# Patient Record
Sex: Female | Born: 1970
Health system: Southern US, Community
[De-identification: ages and names within clinical notes are randomized; demographics above are authoritative.]

## PROBLEM LIST (undated history)

## (undated) DIAGNOSIS — I890 Lymphedema, not elsewhere classified: Secondary | ICD-10-CM

## (undated) DIAGNOSIS — Z923 Personal history of irradiation: Secondary | ICD-10-CM

## (undated) DIAGNOSIS — C801 Malignant (primary) neoplasm, unspecified: Secondary | ICD-10-CM

## (undated) DIAGNOSIS — C50411 Malignant neoplasm of upper-outer quadrant of right female breast: Secondary | ICD-10-CM

## (undated) DIAGNOSIS — C50919 Malignant neoplasm of unspecified site of unspecified female breast: Secondary | ICD-10-CM

## (undated) DIAGNOSIS — Z9221 Personal history of antineoplastic chemotherapy: Secondary | ICD-10-CM

## (undated) DIAGNOSIS — K529 Noninfective gastroenteritis and colitis, unspecified: Secondary | ICD-10-CM

## (undated) HISTORY — DX: Malignant neoplasm of upper-outer quadrant of right female breast: C50.411

## (undated) HISTORY — DX: Noninfective gastroenteritis and colitis, unspecified: K52.9

## (undated) HISTORY — DX: Lymphedema, not elsewhere classified: I89.0

## (undated) HISTORY — DX: Malignant (primary) neoplasm, unspecified: C80.1

## (undated) HISTORY — PX: STRABISMUS SURGERY: SHX218

---

## 2007-10-21 ENCOUNTER — Ambulatory Visit: Payer: Self-pay | Admitting: Internal Medicine

## 2008-10-27 ENCOUNTER — Ambulatory Visit: Payer: Self-pay | Admitting: Internal Medicine

## 2009-10-30 ENCOUNTER — Ambulatory Visit: Payer: Self-pay | Admitting: Internal Medicine

## 2011-04-23 ENCOUNTER — Ambulatory Visit: Payer: Self-pay | Admitting: Internal Medicine

## 2011-04-25 ENCOUNTER — Ambulatory Visit: Payer: Self-pay | Admitting: Internal Medicine

## 2011-11-28 DIAGNOSIS — Z6829 Body mass index (BMI) 29.0-29.9, adult: Secondary | ICD-10-CM | POA: Insufficient documentation

## 2012-04-07 DIAGNOSIS — C801 Malignant (primary) neoplasm, unspecified: Secondary | ICD-10-CM

## 2012-04-07 DIAGNOSIS — C50919 Malignant neoplasm of unspecified site of unspecified female breast: Secondary | ICD-10-CM

## 2012-04-07 HISTORY — PX: BREAST LUMPECTOMY: SHX2

## 2012-04-07 HISTORY — DX: Malignant neoplasm of unspecified site of unspecified female breast: C50.919

## 2012-04-07 HISTORY — DX: Malignant (primary) neoplasm, unspecified: C80.1

## 2012-04-07 HISTORY — PX: BREAST BIOPSY: SHX20

## 2012-05-18 ENCOUNTER — Ambulatory Visit: Payer: Self-pay | Admitting: Internal Medicine

## 2012-06-05 DIAGNOSIS — C50411 Malignant neoplasm of upper-outer quadrant of right female breast: Secondary | ICD-10-CM

## 2012-06-05 DIAGNOSIS — I10 Essential (primary) hypertension: Secondary | ICD-10-CM | POA: Insufficient documentation

## 2012-06-05 HISTORY — DX: Malignant neoplasm of upper-outer quadrant of right female breast: C50.411

## 2012-06-16 ENCOUNTER — Ambulatory Visit: Payer: Self-pay | Admitting: General Surgery

## 2012-06-21 ENCOUNTER — Ambulatory Visit: Payer: Self-pay | Admitting: General Surgery

## 2012-06-21 DIAGNOSIS — C50319 Malignant neoplasm of lower-inner quadrant of unspecified female breast: Secondary | ICD-10-CM

## 2012-06-21 HISTORY — PX: BREAST SURGERY: SHX581

## 2012-06-23 LAB — PATHOLOGY REPORT

## 2012-06-24 ENCOUNTER — Ambulatory Visit (INDEPENDENT_AMBULATORY_CARE_PROVIDER_SITE_OTHER): Payer: No Typology Code available for payment source | Admitting: General Surgery

## 2012-06-24 DIAGNOSIS — C50919 Malignant neoplasm of unspecified site of unspecified female breast: Secondary | ICD-10-CM

## 2012-06-24 NOTE — Patient Instructions (Addendum)
Continue range of motion exercises. Continue to record JP drain output. May shower.

## 2012-06-24 NOTE — Progress Notes (Signed)
Subjective:     Patient ID: Natalie Gallagher, female   DOB: 1970/08/01, 42 y.o.   MRN: [id number]  HPI Patient here today for wound check and JP drain evaluation.  States she is doing well and making progress.   Review of Systems  Constitutional: Negative.   Respiratory: Negative.   Cardiovascular: Negative.        Objective:   Physical Exam    Wounds healing well. No loculated fluid at wide excision site. JP exit site clean.  Assessment:     Breast cancer.     Plan:     Path reviewed. Nodes negative.  Margins clear.  ER/PR positive, Her 2 neu not over expressed. Histologic grade 3, 1.7 cm ( T1c,N0) BRCA negative. Candidate to meet with radiation oncology and medical oncology. She may benefit from Oncotype DX analysis.

## 2012-06-27 DIAGNOSIS — C50919 Malignant neoplasm of unspecified site of unspecified female breast: Secondary | ICD-10-CM | POA: Insufficient documentation

## 2012-06-29 ENCOUNTER — Ambulatory Visit (INDEPENDENT_AMBULATORY_CARE_PROVIDER_SITE_OTHER): Payer: No Typology Code available for payment source | Admitting: *Deleted

## 2012-06-29 DIAGNOSIS — C50911 Malignant neoplasm of unspecified site of right female breast: Secondary | ICD-10-CM

## 2012-06-29 DIAGNOSIS — C50919 Malignant neoplasm of unspecified site of unspecified female breast: Secondary | ICD-10-CM

## 2012-06-29 NOTE — Progress Notes (Signed)
Patient came in today and JP drain is still draining >69ml daily. Recommend to keep drain in for now and pt to call me Thursday with status update.  Pt getting along well. Incision clean.

## 2012-07-01 ENCOUNTER — Ambulatory Visit (INDEPENDENT_AMBULATORY_CARE_PROVIDER_SITE_OTHER): Payer: No Typology Code available for payment source | Admitting: *Deleted

## 2012-07-01 DIAGNOSIS — C50919 Malignant neoplasm of unspecified site of unspecified female breast: Secondary | ICD-10-CM

## 2012-07-01 NOTE — Progress Notes (Signed)
Patient here today for JP drain check.  Drain output <30 ml day/3 days, removed.  Sealed dressing applied. F/U as scheduled

## 2012-07-08 ENCOUNTER — Ambulatory Visit (INDEPENDENT_AMBULATORY_CARE_PROVIDER_SITE_OTHER): Payer: No Typology Code available for payment source | Admitting: General Surgery

## 2012-07-08 ENCOUNTER — Encounter: Payer: Self-pay | Admitting: General Surgery

## 2012-07-08 VITALS — BP 138/82 | HR 80 | Resp 12 | Ht 61.0 in | Wt 166.0 lb

## 2012-07-08 DIAGNOSIS — C50911 Malignant neoplasm of unspecified site of right female breast: Secondary | ICD-10-CM

## 2012-07-08 DIAGNOSIS — C50919 Malignant neoplasm of unspecified site of unspecified female breast: Secondary | ICD-10-CM

## 2012-07-08 NOTE — Progress Notes (Signed)
Patient ID: Natalie Gallagher, female   DOB: 16-Dec-1970, 42 y.o.   MRN: [id number]  Chief Complaint  Patient presents with  . Other    postop    HPI Natalie Gallagher is a 42 y.o. female.  Patient here today for follow up from right breast surgery.  Doing well. No complaints. States she has not had an appointment with the Cancer Center as of yet. HPI  Past Medical History  Diagnosis Date  . Hypertension   . Cancer 2014    right breast.right wide excision with sentinal node biopsy on 06/21/12    Past Surgical History  Procedure Laterality Date  . Breast surgery Right 06/21/2012    right wide excision with sentinal node biopsy on 06/21/12    No family history on file.  Social History History  Substance Use Topics  . Smoking status: Never Smoker   . Smokeless tobacco: Not on file  . Alcohol Use: No    No Known Allergies  Current Outpatient Prescriptions  Medication Sig Dispense Refill  . triamterene-hydrochlorothiazide (MAXZIDE-25) 37.5-25 MG per tablet Take 1 tablet by mouth daily.        No current facility-administered medications for this visit.    Review of Systems Review of Systems  Constitutional: Negative.   Respiratory: Negative.   Cardiovascular: Negative.     Blood pressure 138/82, pulse 80, resp. rate 12, height 5\' 1"  (1.549 m), weight 166 lb (75.297 kg), last menstrual period 06/30/2012.  Physical Exam Physical Exam  Constitutional: She is oriented to person, place, and time. She appears well-developed and well-nourished.  Neurological: She is alert and oriented to person, place, and time.  Skin: Skin is warm and dry.  Incision well healed. Range of motion much improved.  Data Reviewed Pathology showed a 1.7 cm histologic grade 3 invasive ductal carcinoma 17 mm in diameter. Sentinel node could not be identified during the procedure and a formal axillary dissection was completed. 13 nodes were removed, no evidence of metastatic disease.  ER 60%, PR 70%,  HER-2/neu not overexpressing.  Previous BRCA testing was negative for dictation.  Assessment    Right breast cancer.    Plan    Arrangements have been made for the patient to be evaluated by medical oncology and radiation oncology on Tuesday, 07/13/2012.       [name] 07/09/2012, 4:18 PM

## 2012-07-08 NOTE — Patient Instructions (Addendum)
Appointment with Cancer Center scheduled for 07/13/2012.

## 2012-07-09 ENCOUNTER — Encounter: Payer: Self-pay | Admitting: General Surgery

## 2012-07-13 ENCOUNTER — Ambulatory Visit: Payer: Self-pay | Admitting: Hematology and Oncology

## 2012-07-14 ENCOUNTER — Ambulatory Visit: Payer: No Typology Code available for payment source | Admitting: General Surgery

## 2012-07-26 ENCOUNTER — Ambulatory Visit: Payer: Self-pay | Admitting: Emergency Medicine

## 2012-07-29 ENCOUNTER — Other Ambulatory Visit: Payer: Self-pay | Admitting: General Surgery

## 2012-07-29 ENCOUNTER — Telehealth: Payer: Self-pay | Admitting: *Deleted

## 2012-07-29 ENCOUNTER — Telehealth: Payer: Self-pay | Admitting: General Surgery

## 2012-07-29 ENCOUNTER — Ambulatory Visit: Payer: Self-pay

## 2012-07-29 DIAGNOSIS — C50911 Malignant neoplasm of unspecified site of right female breast: Secondary | ICD-10-CM

## 2012-07-29 NOTE — Telephone Encounter (Signed)
Per Steward Drone at Dr. Loistine Chance office, patient needs port placement. Chemotherapy to start on 08-10-12. Patient's surgery has been scheduled for 07-30-12 at [facility]. She is aware of date, time, and instructions.

## 2012-07-29 NOTE — Telephone Encounter (Signed)
The patient has met with medical oncology and is felt to be a candidate for adjuvant chemotherapy. A power port as been requested. Scheduling has necessitated that this be completed tomorrow.  I contacted the patient to review the procedure. Pros and cons of power port placement including risks associated with venous and pulmonary injury were reviewed.  We'll proceed with planned power port placement tomorrow as an outpatient.

## 2012-07-30 ENCOUNTER — Ambulatory Visit: Payer: Self-pay | Admitting: General Surgery

## 2012-07-30 DIAGNOSIS — C50919 Malignant neoplasm of unspecified site of unspecified female breast: Secondary | ICD-10-CM

## 2012-08-05 ENCOUNTER — Ambulatory Visit (INDEPENDENT_AMBULATORY_CARE_PROVIDER_SITE_OTHER): Payer: No Typology Code available for payment source | Admitting: *Deleted

## 2012-08-05 ENCOUNTER — Ambulatory Visit: Payer: Self-pay | Admitting: Hematology and Oncology

## 2012-08-05 DIAGNOSIS — C50919 Malignant neoplasm of unspecified site of unspecified female breast: Secondary | ICD-10-CM

## 2012-08-05 NOTE — Progress Notes (Signed)
Patient here today for follow up post port a cath.  Dressing removed, steristrip in place and aware it may come off in one week.  Some skin irritation/blistering noted from Tegaderm but more definitve at Telfa area.  The patient is aware that a heating pad may be used for comfort as needed.  States she is finishing up ATB for infection in her finger left hand (hang nail). Follow up as scheduled Aug 17 2012 unless determined different by MD.

## 2012-08-05 NOTE — Patient Instructions (Addendum)
The patient is aware that a heating pad may be used for comfort as needed. Aware to use coco butter or neosporin to irritated areas as needed. To start chemo treatments Tuesday. Follow up as scheduled.

## 2012-08-10 LAB — CBC CANCER CENTER
Basophil %: 1.2 %
Eosinophil #: 0.2 x10 3/mm (ref 0.0–0.7)
Lymphocyte #: 2.3 x10 3/mm (ref 1.0–3.6)
Lymphocyte %: 23.9 %
MCHC: 33.3 g/dL (ref 32.0–36.0)
MCV: 86 fL (ref 80–100)
Monocyte #: 0.7 x10 3/mm (ref 0.2–0.9)
Neutrophil #: 6.4 x10 3/mm (ref 1.4–6.5)
Platelet: 287 x10 3/mm (ref 150–440)
WBC: 9.8 x10 3/mm (ref 3.6–11.0)

## 2012-08-10 LAB — COMPREHENSIVE METABOLIC PANEL
Alkaline Phosphatase: 81 U/L (ref 50–136)
Anion Gap: 10 (ref 7–16)
Co2: 29 mmol/L (ref 21–32)
EGFR (African American): >60
EGFR (Non-African Amer.): >60
Potassium: 3.4 mmol/L — ABNORMAL LOW (ref 3.5–5.1)
Sodium: 141 mmol/L (ref 136–145)
Total Protein: 7.9 g/dL (ref 6.4–8.2)

## 2012-08-17 ENCOUNTER — Other Ambulatory Visit: Payer: Self-pay | Admitting: *Deleted

## 2012-08-17 ENCOUNTER — Ambulatory Visit: Payer: No Typology Code available for payment source | Admitting: General Surgery

## 2012-08-17 ENCOUNTER — Encounter: Payer: Self-pay | Admitting: General Surgery

## 2012-08-17 ENCOUNTER — Ambulatory Visit (INDEPENDENT_AMBULATORY_CARE_PROVIDER_SITE_OTHER): Payer: No Typology Code available for payment source | Admitting: General Surgery

## 2012-08-17 VITALS — BP 110/64 | HR 74 | Resp 14 | Ht 61.0 in | Wt 171.0 lb

## 2012-08-17 DIAGNOSIS — C50919 Malignant neoplasm of unspecified site of unspecified female breast: Secondary | ICD-10-CM

## 2012-08-17 LAB — BASIC METABOLIC PANEL
Anion Gap: 10 (ref 7–16)
BUN: 15 mg/dL (ref 7–18)
Creatinine: 0.92 mg/dL (ref 0.60–1.30)
EGFR (Non-African Amer.): >60
Glucose: 96 mg/dL (ref 65–99)
Osmolality: 278 (ref 275–301)
Sodium: 139 mmol/L (ref 136–145)

## 2012-08-17 LAB — CBC CANCER CENTER
Basophil #: 0 x10 3/mm (ref 0.0–0.1)
Eosinophil #: 0.1 x10 3/mm (ref 0.0–0.7)
Eosinophil %: 1.3 %
HCT: 35.1 % (ref 35.0–47.0)
HGB: 11.8 g/dL — ABNORMAL LOW (ref 12.0–16.0)
Lymphocyte #: 2.5 x10 3/mm (ref 1.0–3.6)
Lymphocyte %: 28.2 %
MCV: 85 fL (ref 80–100)
Monocyte %: 7.2 %
Neutrophil #: 5.6 x10 3/mm (ref 1.4–6.5)
Platelet: 251 x10 3/mm (ref 150–440)

## 2012-08-17 NOTE — Patient Instructions (Signed)
Return in 4 months after mammogram.

## 2012-08-17 NOTE — Progress Notes (Signed)
   Patient ID: Natalie Gallagher, female   DOB: [birth year], 42 y.o.   MRN: [id number]  Chief Complaint  Patient presents with  . Other    breast ca    HPI Natalie Gallagher is a 42 y.o. female here today for her follow up right wide excision with sentinal node biopsy on 3/17/1 Patient states is doing well. No new breast problem.Just started chemo 08/17/12. HPI  Past Medical History  Diagnosis Date  . Hypertension   . Cancer 2014    right breast.right wide excision with sentinal node biopsy on 06/21/12    Past Surgical History  Procedure Laterality Date  . Breast surgery Right 06/21/2012    right wide excision with sentinal node biopsy on 06/21/12    No family history on file.  Social History History  Substance Use Topics  . Smoking status: Never Smoker   . Smokeless tobacco: Not on file  . Alcohol Use: No    Allergies  Allergen Reactions  . Tape     The patient showed a cutaneous reaction to Telfa applied beneath a Tegaderm dressing. Telfa appears to be the primary offensive material.    Current Outpatient Prescriptions  Medication Sig Dispense Refill  . triamterene-hydrochlorothiazide (MAXZIDE-25) 37.5-25 MG per tablet Take 1 tablet by mouth daily.        No current facility-administered medications for this visit.    Review of Systems Review of Systems  Constitutional: Negative.   Respiratory: Negative.   Cardiovascular: Negative.     Blood pressure 110/64, pulse 74, resp. rate 14, height 5\' 1"  (1.549 m), weight 171 lb (77.565 kg), last menstrual period 07/27/2012.  Physical Exam Physical Exam  Pulmonary/Chest: Right breast exhibits no inverted nipple, no mass, no nipple discharge, no skin change and no tenderness. Left breast exhibits no inverted nipple, no mass, no nipple discharge, no skin change and no tenderness.   the right breast wide excision site is healing well. No focal tenderness or induration.  The power port site is clean. She reports no difficulty with  access at the time of her chemotherapy earlier today.  She was concerned about some mild swelling in the right upper arm. Serial measurements were obtained at a location 20 cm and 10 cm above the olecranon process as well as tendon 20 cm below the olecranon process.  On the right side these measurements yielded: 39.5, 32.5, 25 and 19 cm respectively.  On the left side these measurements yielded 38.5, 33, 25 and 19 cm respectively.   Data Reviewed None  Assessment    Doing well status post wide excision of her right breast tumor in status post left subclavian power port placement.  Minimal upper right arm swelling, questionably related to lymphedema this time.      Plan    With a 1 cm difference in the upper extremity measurements in one location, I think at this time observation alone is warranted.  The patient will continue her present planned adjuvant chemotherapy based on high intermediate risk Oncotype testing under the care of Dr. Wendie Simmer.       [name] 08/18/2012, 7:08 AM

## 2012-08-17 NOTE — Progress Notes (Signed)
The patient has been asked to return to the office in four months for a unilateral right breast diagnostic mammogram.  

## 2012-08-18 ENCOUNTER — Encounter: Payer: Self-pay | Admitting: General Surgery

## 2012-08-26 LAB — CBC CANCER CENTER
Basophil %: 0.5 %
Eosinophil #: 0 x10 3/mm (ref 0.0–0.7)
Eosinophil %: 0.7 %
HGB: 12.2 g/dL (ref 12.0–16.0)
Lymphocyte #: 1.8 x10 3/mm (ref 1.0–3.6)
MCH: 28.9 pg (ref 26.0–34.0)
MCHC: 34 g/dL (ref 32.0–36.0)
Monocyte #: 0.3 x10 3/mm (ref 0.2–0.9)
Neutrophil #: 5 x10 3/mm (ref 1.4–6.5)
Neutrophil %: 68.9 %
Platelet: 201 x10 3/mm (ref 150–440)
RBC: 4.23 10*6/uL (ref 3.80–5.20)
RDW: 13.4 % (ref 11.5–14.5)

## 2012-09-05 ENCOUNTER — Ambulatory Visit: Payer: Self-pay | Admitting: Hematology and Oncology

## 2012-09-07 LAB — CBC CANCER CENTER
Basophil #: 0 x10 3/mm (ref 0.0–0.1)
Basophil %: 0.1 %
Eosinophil #: 0 x10 3/mm (ref 0.0–0.7)
Eosinophil %: 0.1 %
HCT: 35.5 % (ref 35.0–47.0)
HGB: 12.1 g/dL (ref 12.0–16.0)
Lymphocyte %: 20.8 %
MCH: 29.5 pg (ref 26.0–34.0)
MCV: 86 fL (ref 80–100)
Neutrophil %: 69.6 %
RDW: 14.2 % (ref 11.5–14.5)
WBC: 9.8 x10 3/mm (ref 3.6–11.0)

## 2012-09-07 LAB — BASIC METABOLIC PANEL
Anion Gap: 10 (ref 7–16)
BUN: 19 mg/dL — ABNORMAL HIGH (ref 7–18)
Chloride: 102 mmol/L (ref 98–107)
EGFR (Non-African Amer.): >60
Osmolality: 281 (ref 275–301)

## 2012-09-15 LAB — CBC CANCER CENTER
Basophil #: 0 x10 3/mm (ref 0.0–0.1)
Basophil %: 1 %
HGB: 11.5 g/dL — ABNORMAL LOW (ref 12.0–16.0)
Lymphocyte #: 0.7 x10 3/mm — ABNORMAL LOW (ref 1.0–3.6)
MCV: 86 fL (ref 80–100)
Monocyte #: 0.2 x10 3/mm (ref 0.2–0.9)
Neutrophil #: 0.9 x10 3/mm — ABNORMAL LOW (ref 1.4–6.5)

## 2012-09-15 LAB — BASIC METABOLIC PANEL
Anion Gap: 6 — ABNORMAL LOW (ref 7–16)
BUN: 15 mg/dL (ref 7–18)
Calcium, Total: 9 mg/dL (ref 8.5–10.1)
EGFR (African American): >60
Osmolality: 276 (ref 275–301)
Sodium: 138 mmol/L (ref 136–145)

## 2012-09-28 LAB — CBC CANCER CENTER
Basophil %: 0.6 %
Eosinophil #: 0 x10 3/mm (ref 0.0–0.7)
Lymphocyte #: 1.3 x10 3/mm (ref 1.0–3.6)
Lymphocyte %: 22.7 %
MCH: 30.6 pg (ref 26.0–34.0)
Monocyte #: 0.8 x10 3/mm (ref 0.2–0.9)
Monocyte %: 13.1 %
Neutrophil #: 3.7 x10 3/mm (ref 1.4–6.5)
Neutrophil %: 63.1 %
RBC: 3.71 10*6/uL — ABNORMAL LOW (ref 3.80–5.20)
WBC: 5.9 x10 3/mm (ref 3.6–11.0)

## 2012-09-28 LAB — BASIC METABOLIC PANEL
Anion Gap: 8 (ref 7–16)
BUN: 18 mg/dL (ref 7–18)
Calcium, Total: 8.9 mg/dL (ref 8.5–10.1)
Chloride: 102 mmol/L (ref 98–107)
Co2: 30 mmol/L (ref 21–32)
Creatinine: 1.04 mg/dL (ref 0.60–1.30)
EGFR (African American): >60
EGFR (Non-African Amer.): >60
Glucose: 91 mg/dL (ref 65–99)
Osmolality: 281 (ref 275–301)
Potassium: 3.1 mmol/L — ABNORMAL LOW (ref 3.5–5.1)
Sodium: 140 mmol/L (ref 136–145)

## 2012-10-05 ENCOUNTER — Ambulatory Visit: Payer: Self-pay | Admitting: Hematology and Oncology

## 2012-10-14 LAB — CBC CANCER CENTER
Basophil #: 0.1 x10 3/mm (ref 0.0–0.1)
Basophil %: 0.5 %
Eosinophil #: 0 x10 3/mm (ref 0.0–0.7)
HCT: 28.9 % — ABNORMAL LOW (ref 35.0–47.0)
HGB: 9.8 g/dL — ABNORMAL LOW (ref 12.0–16.0)
Lymphocyte #: 1.3 x10 3/mm (ref 1.0–3.6)
MCHC: 34 g/dL (ref 32.0–36.0)
MCV: 88 fL (ref 80–100)
Monocyte #: 1.3 x10 3/mm — ABNORMAL HIGH (ref 0.2–0.9)
Neutrophil #: 9.1 x10 3/mm — ABNORMAL HIGH (ref 1.4–6.5)
Neutrophil %: 76.9 %
Platelet: 310 x10 3/mm (ref 150–440)
RBC: 3.27 10*6/uL — ABNORMAL LOW (ref 3.80–5.20)

## 2012-10-19 LAB — CBC CANCER CENTER
Eosinophil #: 0 x10 3/mm (ref 0.0–0.7)
HGB: 10.5 g/dL — ABNORMAL LOW (ref 12.0–16.0)
MCH: 30.7 pg (ref 26.0–34.0)
MCHC: 34.5 g/dL (ref 32.0–36.0)
Monocyte #: 0.7 x10 3/mm (ref 0.2–0.9)
Neutrophil #: 4 x10 3/mm (ref 1.4–6.5)
Neutrophil %: 68.4 %
Platelet: 331 x10 3/mm (ref 150–440)
RBC: 3.43 10*6/uL — ABNORMAL LOW (ref 3.80–5.20)
RDW: 17.1 % — ABNORMAL HIGH (ref 11.5–14.5)

## 2012-10-19 LAB — BASIC METABOLIC PANEL
Creatinine: 1.06 mg/dL (ref 0.60–1.30)
EGFR (Non-African Amer.): >60
Glucose: 99 mg/dL (ref 65–99)
Osmolality: 283 (ref 275–301)
Potassium: 3.7 mmol/L (ref 3.5–5.1)
Sodium: 142 mmol/L (ref 136–145)

## 2012-10-26 ENCOUNTER — Encounter: Payer: Self-pay | Admitting: Internal Medicine

## 2012-11-05 ENCOUNTER — Ambulatory Visit: Payer: Self-pay | Admitting: Hematology and Oncology

## 2012-11-05 ENCOUNTER — Encounter: Payer: Self-pay | Admitting: Internal Medicine

## 2012-11-09 LAB — CBC CANCER CENTER
Basophil %: 0.3 %
Eosinophil #: 0 x10 3/mm (ref 0.0–0.7)
Eosinophil %: 0.4 %
HCT: 27.2 % — ABNORMAL LOW (ref 35.0–47.0)
HGB: 9.5 g/dL — ABNORMAL LOW (ref 12.0–16.0)
Lymphocyte #: 0.9 x10 3/mm — ABNORMAL LOW (ref 1.0–3.6)
Lymphocyte %: 16.8 %
MCHC: 35 g/dL (ref 32.0–36.0)
Monocyte #: 0.9 x10 3/mm (ref 0.2–0.9)
Monocyte %: 18.1 %
Neutrophil #: 3.4 x10 3/mm (ref 1.4–6.5)
Neutrophil %: 64.4 %
Platelet: 276 x10 3/mm (ref 150–440)
RDW: 17.9 % — ABNORMAL HIGH (ref 11.5–14.5)
WBC: 5.2 x10 3/mm (ref 3.6–11.0)

## 2012-11-09 LAB — COMPREHENSIVE METABOLIC PANEL
Albumin: 3.5 g/dL (ref 3.4–5.0)
Anion Gap: 8 (ref 7–16)
Bilirubin,Total: 0.2 mg/dL (ref 0.2–1.0)
Chloride: 105 mmol/L (ref 98–107)
EGFR (Non-African Amer.): >60
Glucose: 97 mg/dL (ref 65–99)
Potassium: 3.5 mmol/L (ref 3.5–5.1)
SGOT(AST): 22 U/L (ref 15–37)
Total Protein: 7 g/dL (ref 6.4–8.2)

## 2012-11-16 LAB — CBC CANCER CENTER
Basophil #: 0 x10 3/mm (ref 0.0–0.1)
Basophil %: 0.5 %
Eosinophil %: 1.3 %
HCT: 27.2 % — ABNORMAL LOW (ref 35.0–47.0)
HGB: 9.5 g/dL — ABNORMAL LOW (ref 12.0–16.0)
Lymphocyte #: 1 x10 3/mm (ref 1.0–3.6)
Lymphocyte %: 17.7 %
MCH: 31.7 pg (ref 26.0–34.0)
MCV: 91 fL (ref 80–100)
Monocyte %: 10 %
Neutrophil #: 3.9 x10 3/mm (ref 1.4–6.5)
Platelet: 273 x10 3/mm (ref 150–440)
RBC: 3 10*6/uL — ABNORMAL LOW (ref 3.80–5.20)
RDW: 16.8 % — ABNORMAL HIGH (ref 11.5–14.5)
WBC: 5.6 x10 3/mm (ref 3.6–11.0)

## 2012-11-16 LAB — COMPREHENSIVE METABOLIC PANEL
Albumin: 3.4 g/dL (ref 3.4–5.0)
BUN: 13 mg/dL (ref 7–18)
Bilirubin,Total: 0.3 mg/dL (ref 0.2–1.0)
Creatinine: 1.05 mg/dL (ref 0.60–1.30)
EGFR (African American): >60
EGFR (Non-African Amer.): >60
Glucose: 83 mg/dL (ref 65–99)
Osmolality: 282 (ref 275–301)
Potassium: 3.7 mmol/L (ref 3.5–5.1)
SGOT(AST): 37 U/L (ref 15–37)
SGPT (ALT): 85 U/L — ABNORMAL HIGH (ref 12–78)
Sodium: 142 mmol/L (ref 136–145)
Total Protein: 7.1 g/dL (ref 6.4–8.2)

## 2012-11-23 LAB — CBC CANCER CENTER
Basophil #: 0 x10 3/mm (ref 0.0–0.1)
Basophil %: 0.5 %
Eosinophil #: 0.1 x10 3/mm (ref 0.0–0.7)
Eosinophil %: 2 %
HCT: 25.3 % — ABNORMAL LOW (ref 35.0–47.0)
Lymphocyte #: 0.8 x10 3/mm — ABNORMAL LOW (ref 1.0–3.6)
Lymphocyte %: 18.3 %
MCH: 32.5 pg (ref 26.0–34.0)
Monocyte #: 0.3 x10 3/mm (ref 0.2–0.9)
Monocyte %: 7.5 %
RDW: 17.2 % — ABNORMAL HIGH (ref 11.5–14.5)

## 2012-11-23 LAB — COMPREHENSIVE METABOLIC PANEL
Alkaline Phosphatase: 74 U/L (ref 50–136)
Anion Gap: 6 — ABNORMAL LOW (ref 7–16)
BUN: 10 mg/dL (ref 7–18)
Bilirubin,Total: 0.2 mg/dL (ref 0.2–1.0)
Chloride: 104 mmol/L (ref 98–107)
Co2: 29 mmol/L (ref 21–32)
Creatinine: 0.94 mg/dL (ref 0.60–1.30)
EGFR (Non-African Amer.): >60
Glucose: 89 mg/dL (ref 65–99)
Potassium: 3.4 mmol/L — ABNORMAL LOW (ref 3.5–5.1)
SGOT(AST): 29 U/L (ref 15–37)
SGPT (ALT): 67 U/L (ref 12–78)
Total Protein: 6.9 g/dL (ref 6.4–8.2)

## 2012-11-25 ENCOUNTER — Telehealth: Payer: Self-pay | Admitting: *Deleted

## 2012-11-25 NOTE — Telephone Encounter (Signed)
Notified patient as instructed. She was just concerned because they had told her that she did need one.

## 2012-11-25 NOTE — Telephone Encounter (Signed)
Message copied by Currie Paris on Thu Nov 25, 2012  8:32 AM ------      Message from: [place], [place] W      Created: Wed Nov 24, 2012  9:12 PM      Regarding: FW: Patient       Contact: [phone]       Please notify the patient that she does not need a compression sleeve for airline travel.      ----- Message -----         From: Jena Gauss, CMA         Sent: 11/24/2012   9:45 AM           To: [name], MD      Subject: Patient                                                  Pt called and wanted to know if she can get a prescription for a compression sleeve with gauntlet. She is flying out of town and was wondering when she might could receive this.        ------

## 2012-11-29 ENCOUNTER — Ambulatory Visit: Payer: Self-pay | Admitting: Otolaryngology

## 2012-11-29 LAB — HCG, QUANTITATIVE, PREGNANCY: Beta Hcg, Quant.: <1 m[IU]/mL — ABNORMAL LOW

## 2012-11-30 ENCOUNTER — Ambulatory Visit: Payer: Self-pay | Admitting: Otolaryngology

## 2012-11-30 LAB — COMPREHENSIVE METABOLIC PANEL
Albumin: 3.2 g/dL — ABNORMAL LOW (ref 3.4–5.0)
Alkaline Phosphatase: 78 U/L (ref 50–136)
BUN: 10 mg/dL (ref 7–18)
Bilirubin,Total: 0.2 mg/dL (ref 0.2–1.0)
Calcium, Total: 9 mg/dL (ref 8.5–10.1)
Chloride: 105 mmol/L (ref 98–107)
Co2: 30 mmol/L (ref 21–32)
Glucose: 92 mg/dL (ref 65–99)
Potassium: 3.4 mmol/L — ABNORMAL LOW (ref 3.5–5.1)

## 2012-11-30 LAB — CBC CANCER CENTER
Lymphocyte %: 20.3 %
MCHC: 35.2 g/dL (ref 32.0–36.0)
MCV: 92 fL (ref 80–100)
Monocyte #: 0.4 x10 3/mm (ref 0.2–0.9)
Neutrophil #: 3 x10 3/mm (ref 1.4–6.5)
WBC: 4.3 x10 3/mm (ref 3.6–11.0)

## 2012-12-06 ENCOUNTER — Ambulatory Visit: Payer: Self-pay | Admitting: Hematology and Oncology

## 2012-12-07 LAB — CBC CANCER CENTER
Basophil %: 0.5 %
Eosinophil %: 1.8 %
Lymphocyte #: 0.9 x10 3/mm — ABNORMAL LOW (ref 1.0–3.6)
Lymphocyte %: 19.8 %
Monocyte #: 0.4 x10 3/mm (ref 0.2–0.9)
Neutrophil %: 68.9 %
Platelet: 264 x10 3/mm (ref 150–440)
RBC: 2.8 10*6/uL — ABNORMAL LOW (ref 3.80–5.20)
RDW: 16.7 % — ABNORMAL HIGH (ref 11.5–14.5)

## 2012-12-07 LAB — BASIC METABOLIC PANEL
BUN: 12 mg/dL (ref 7–18)
Calcium, Total: 8.9 mg/dL (ref 8.5–10.1)
Co2: 27 mmol/L (ref 21–32)
Osmolality: 283 (ref 275–301)
Potassium: 3.5 mmol/L (ref 3.5–5.1)

## 2012-12-14 LAB — BASIC METABOLIC PANEL
Anion Gap: 7 (ref 7–16)
Chloride: 104 mmol/L (ref 98–107)
Creatinine: 0.97 mg/dL (ref 0.60–1.30)
Osmolality: 281 (ref 275–301)
Potassium: 3.9 mmol/L (ref 3.5–5.1)
Sodium: 142 mmol/L (ref 136–145)

## 2012-12-14 LAB — CBC CANCER CENTER
Basophil #: 0 x10 3/mm (ref 0.0–0.1)
Basophil %: 0.5 %
Eosinophil #: 0.1 x10 3/mm (ref 0.0–0.7)
Eosinophil %: 1.3 %
HCT: 28.3 % — ABNORMAL LOW (ref 35.0–47.0)
Lymphocyte #: 1 x10 3/mm (ref 1.0–3.6)
Lymphocyte %: 21.3 %
MCH: 32 pg (ref 26.0–34.0)
MCV: 92 fL (ref 80–100)
Monocyte #: 0.4 x10 3/mm (ref 0.2–0.9)
Monocyte %: 8.3 %
Neutrophil #: 3.1 x10 3/mm (ref 1.4–6.5)
Neutrophil %: 68.6 %
Platelet: 298 x10 3/mm (ref 150–440)
RBC: 3.06 10*6/uL — ABNORMAL LOW (ref 3.80–5.20)
RDW: 16.7 % — ABNORMAL HIGH (ref 11.5–14.5)

## 2012-12-21 LAB — BASIC METABOLIC PANEL
Anion Gap: 10 (ref 7–16)
BUN: 12 mg/dL (ref 7–18)
Calcium, Total: 9.3 mg/dL (ref 8.5–10.1)
Chloride: 105 mmol/L (ref 98–107)
Creatinine: 0.87 mg/dL (ref 0.60–1.30)
EGFR (African American): >60
Glucose: 94 mg/dL (ref 65–99)
Potassium: 3.4 mmol/L — ABNORMAL LOW (ref 3.5–5.1)
Sodium: 143 mmol/L (ref 136–145)

## 2012-12-21 LAB — CBC CANCER CENTER
Basophil #: 0 x10 3/mm (ref 0.0–0.1)
Eosinophil %: 1 %
Lymphocyte #: 1 x10 3/mm (ref 1.0–3.6)
Lymphocyte %: 21.1 %
MCV: 92 fL (ref 80–100)
Neutrophil %: 68.2 %
Platelet: 288 x10 3/mm (ref 150–440)
RBC: 3.02 10*6/uL — ABNORMAL LOW (ref 3.80–5.20)
WBC: 4.7 x10 3/mm (ref 3.6–11.0)

## 2012-12-23 ENCOUNTER — Ambulatory Visit: Payer: Self-pay | Admitting: General Surgery

## 2012-12-27 ENCOUNTER — Encounter: Payer: Self-pay | Admitting: General Surgery

## 2012-12-28 LAB — CBC CANCER CENTER
Basophil %: 0.4 %
Eosinophil #: 0.1 x10 3/mm (ref 0.0–0.7)
HCT: 30 % — ABNORMAL LOW (ref 35.0–47.0)
HGB: 10.1 g/dL — ABNORMAL LOW (ref 12.0–16.0)
Lymphocyte #: 1 x10 3/mm (ref 1.0–3.6)
MCH: 31.1 pg (ref 26.0–34.0)
MCHC: 33.6 g/dL (ref 32.0–36.0)
Monocyte #: 0.4 x10 3/mm (ref 0.2–0.9)
Monocyte %: 10 %
RDW: 15.7 % — ABNORMAL HIGH (ref 11.5–14.5)
WBC: 4.5 x10 3/mm (ref 3.6–11.0)

## 2012-12-28 LAB — BASIC METABOLIC PANEL
Anion Gap: 8 (ref 7–16)
BUN: 8 mg/dL (ref 7–18)
Creatinine: 0.85 mg/dL (ref 0.60–1.30)
EGFR (African American): >60
Glucose: 84 mg/dL (ref 65–99)
Osmolality: 283 (ref 275–301)
Potassium: 3.5 mmol/L (ref 3.5–5.1)
Sodium: 143 mmol/L (ref 136–145)

## 2013-01-04 ENCOUNTER — Ambulatory Visit: Payer: No Typology Code available for payment source | Admitting: General Surgery

## 2013-01-04 LAB — BASIC METABOLIC PANEL
BUN: 10 mg/dL (ref 7–18)
Calcium, Total: 8.6 mg/dL (ref 8.5–10.1)
Chloride: 105 mmol/L (ref 98–107)
Creatinine: 0.82 mg/dL (ref 0.60–1.30)
EGFR (African American): >60
Glucose: 84 mg/dL (ref 65–99)
Osmolality: 283 (ref 275–301)
Potassium: 3.5 mmol/L (ref 3.5–5.1)
Sodium: 143 mmol/L (ref 136–145)

## 2013-01-04 LAB — CBC CANCER CENTER
Basophil %: 0.5 %
Eosinophil #: 0 x10 3/mm (ref 0.0–0.7)
Eosinophil %: 1.1 %
MCH: 31.4 pg (ref 26.0–34.0)
MCV: 92 fL (ref 80–100)
Monocyte #: 0.4 x10 3/mm (ref 0.2–0.9)
Neutrophil #: 3.3 x10 3/mm (ref 1.4–6.5)
Platelet: 305 x10 3/mm (ref 150–440)
RBC: 3.29 10*6/uL — ABNORMAL LOW (ref 3.80–5.20)
RDW: 15.9 % — ABNORMAL HIGH (ref 11.5–14.5)
WBC: 4.7 x10 3/mm (ref 3.6–11.0)

## 2013-01-05 ENCOUNTER — Ambulatory Visit: Payer: Self-pay | Admitting: Hematology and Oncology

## 2013-01-11 LAB — CBC CANCER CENTER
Basophil %: 0.3 %
Eosinophil #: 0.1 x10 3/mm (ref 0.0–0.7)
Eosinophil %: 1.2 %
Lymphocyte #: 1 x10 3/mm (ref 1.0–3.6)
Lymphocyte %: 19.1 %
MCHC: 34.2 g/dL (ref 32.0–36.0)
Monocyte %: 7.3 %
Neutrophil #: 3.6 x10 3/mm (ref 1.4–6.5)
Neutrophil %: 72.1 %
Platelet: 323 x10 3/mm (ref 150–440)
RBC: 3.41 10*6/uL — ABNORMAL LOW (ref 3.80–5.20)
WBC: 5 x10 3/mm (ref 3.6–11.0)

## 2013-01-11 LAB — BASIC METABOLIC PANEL
Anion Gap: 8 (ref 7–16)
BUN: 10 mg/dL (ref 7–18)
Calcium, Total: 8.4 mg/dL — ABNORMAL LOW (ref 8.5–10.1)
Co2: 29 mmol/L (ref 21–32)
Creatinine: 0.86 mg/dL (ref 0.60–1.30)
EGFR (Non-African Amer.): >60
Glucose: 97 mg/dL (ref 65–99)
Osmolality: 282 (ref 275–301)
Potassium: 3.5 mmol/L (ref 3.5–5.1)
Sodium: 142 mmol/L (ref 136–145)

## 2013-01-18 LAB — CBC CANCER CENTER
Basophil %: 0.6 %
Eosinophil %: 1 %
HCT: 31.3 % — ABNORMAL LOW (ref 35.0–47.0)
HGB: 10.7 g/dL — ABNORMAL LOW (ref 12.0–16.0)
Lymphocyte %: 18.5 %
MCHC: 34.3 g/dL (ref 32.0–36.0)
MCV: 91 fL (ref 80–100)
Monocyte #: 0.3 x10 3/mm (ref 0.2–0.9)
Neutrophil %: 72.6 %
Platelet: 309 x10 3/mm (ref 150–440)
RBC: 3.43 10*6/uL — ABNORMAL LOW (ref 3.80–5.20)
RDW: 15.7 % — ABNORMAL HIGH (ref 11.5–14.5)
WBC: 4.5 x10 3/mm (ref 3.6–11.0)

## 2013-01-18 LAB — BASIC METABOLIC PANEL
BUN: 12 mg/dL (ref 7–18)
Calcium, Total: 8.5 mg/dL (ref 8.5–10.1)
Chloride: 104 mmol/L (ref 98–107)
EGFR (African American): >60
Glucose: 96 mg/dL (ref 65–99)
Osmolality: 283 (ref 275–301)
Potassium: 3.4 mmol/L — ABNORMAL LOW (ref 3.5–5.1)

## 2013-01-24 ENCOUNTER — Ambulatory Visit (INDEPENDENT_AMBULATORY_CARE_PROVIDER_SITE_OTHER): Payer: BC Managed Care – PPO | Admitting: General Surgery

## 2013-01-24 ENCOUNTER — Encounter: Payer: Self-pay | Admitting: General Surgery

## 2013-01-24 VITALS — BP 138/76 | HR 76 | Resp 12 | Ht 61.0 in | Wt 168.0 lb

## 2013-01-24 DIAGNOSIS — C50911 Malignant neoplasm of unspecified site of right female breast: Secondary | ICD-10-CM

## 2013-01-24 DIAGNOSIS — C50919 Malignant neoplasm of unspecified site of unspecified female breast: Secondary | ICD-10-CM

## 2013-01-24 NOTE — Patient Instructions (Signed)
Patient to return in 6 months with bilateral diagnostic mammogram. Patient to contact our office with any new questions or concerns.

## 2013-01-24 NOTE — Progress Notes (Signed)
Patient ID: Natalie Gallagher, female   DOB: [birth year], 42 y.o.   MRN: [id number]  Chief Complaint  Patient presents with  . Follow-up    4 month follow up right mammogram     HPI Natalie Gallagher is a 42 y.o. female who presents for a breast evaluation. The most recent mammogram was done on 12/23/12. Patient does perform regular self breast checks and gets regular mammograms done.  The patient denies any new problems at this time.    HPI  Past Medical History  Diagnosis Date  . Hypertension   . Cancer 2014    right breast.right wide excision with sentinal node biopsy on 06/21/12    Past Surgical History  Procedure Laterality Date  . Breast surgery Right 06/21/2012    right wide excision with sentinal node biopsy on 06/21/12    No family history on file.  Social History History  Substance Use Topics  . Smoking status: Never Smoker   . Smokeless tobacco: Not on file  . Alcohol Use: No    Allergies  Allergen Reactions  . Tape     The patient showed a cutaneous reaction to Telfa applied beneath a Tegaderm dressing. Telfa appears to be the primary offensive material.    No current outpatient prescriptions on file.   No current facility-administered medications for this visit.    Review of Systems Review of Systems  Constitutional: Negative.   Respiratory: Negative.   Cardiovascular: Negative.     Blood pressure 138/76, pulse 76, resp. rate 12, height 5\' 1"  (1.549 m), weight 168 lb (76.204 kg).  Physical Exam Physical Exam  Constitutional: She is oriented to person, place, and time. She appears well-developed and well-nourished.  Neck: No thyromegaly present.  Cardiovascular: Normal rate, regular rhythm and normal heart sounds.   No murmur heard. Pulmonary/Chest: Effort normal and breath sounds normal. Right breast exhibits no inverted nipple, no mass, no nipple discharge, no skin change and no tenderness. Left breast exhibits no inverted nipple, no mass, no nipple discharge,  no skin change and no tenderness.  Well healed right medial scar and axillary scar. Good breast symmetry. Minimal volume loss in the lower medial aspect of the right breast.  Lymphadenopathy:    She has no cervical adenopathy.    She has no axillary adenopathy.  Neurological: She is alert and oriented to person, place, and time.  Skin: Skin is warm and dry.    Data Reviewed Right breast mammogram dated 12/23/2012 shows postoperative changes. BI-RAD-2.  Assessment    Doing well. No progression of right upper extremity edema.    Plan    The patient will complete her last course of her septum tomorrow. Follow up examination here in 6 months.       Natalie Gallagher 01/25/2013, 5:05 PM

## 2013-01-25 LAB — CBC CANCER CENTER
Basophil #: 0 x10 3/mm (ref 0.0–0.1)
Basophil %: 0.5 %
HGB: 10.9 g/dL — ABNORMAL LOW (ref 12.0–16.0)
Lymphocyte %: 22.6 %
MCHC: 34.1 g/dL (ref 32.0–36.0)
Monocyte #: 0.4 x10 3/mm (ref 0.2–0.9)
Monocyte %: 8.5 %
Neutrophil #: 3.2 x10 3/mm (ref 1.4–6.5)
Neutrophil %: 67.5 %
RBC: 3.52 10*6/uL — ABNORMAL LOW (ref 3.80–5.20)

## 2013-01-25 LAB — BASIC METABOLIC PANEL
Anion Gap: 8 (ref 7–16)
BUN: 11 mg/dL (ref 7–18)
Calcium, Total: 8.6 mg/dL (ref 8.5–10.1)
Chloride: 105 mmol/L (ref 98–107)
EGFR (Non-African Amer.): >60
Glucose: 102 mg/dL — ABNORMAL HIGH (ref 65–99)
Osmolality: 281 (ref 275–301)
Potassium: 3.3 mmol/L — ABNORMAL LOW (ref 3.5–5.1)

## 2013-02-05 ENCOUNTER — Ambulatory Visit: Payer: Self-pay | Admitting: Hematology and Oncology

## 2013-02-15 LAB — BASIC METABOLIC PANEL
BUN: 15 mg/dL (ref 7–18)
Co2: 30 mmol/L (ref 21–32)
Creatinine: 0.78 mg/dL (ref 0.60–1.30)
EGFR (Non-African Amer.): >60
Glucose: 85 mg/dL (ref 65–99)
Potassium: 3.6 mmol/L (ref 3.5–5.1)

## 2013-02-15 LAB — CBC CANCER CENTER
Eosinophil #: 0.1 x10 3/mm (ref 0.0–0.7)
Eosinophil %: 1.1 %
HGB: 11.2 g/dL — ABNORMAL LOW (ref 12.0–16.0)
Lymphocyte #: 1.6 x10 3/mm (ref 1.0–3.6)
Lymphocyte %: 22.6 %
MCHC: 32.7 g/dL (ref 32.0–36.0)
Monocyte #: 0.7 x10 3/mm (ref 0.2–0.9)
Monocyte %: 10.4 %
Neutrophil #: 4.7 x10 3/mm (ref 1.4–6.5)
Neutrophil %: 65.6 %
Platelet: 274 x10 3/mm (ref 150–440)
RBC: 3.9 10*6/uL (ref 3.80–5.20)
RDW: 15.8 % — ABNORMAL HIGH (ref 11.5–14.5)
WBC: 7.2 x10 3/mm (ref 3.6–11.0)

## 2013-03-02 LAB — CBC CANCER CENTER
Basophil #: 0 x10 3/mm (ref 0.0–0.1)
Eosinophil #: 0.1 x10 3/mm (ref 0.0–0.7)
HCT: 35.4 % (ref 35.0–47.0)
HGB: 11.7 g/dL — ABNORMAL LOW (ref 12.0–16.0)
Lymphocyte %: 25.2 %
MCH: 28.5 pg (ref 26.0–34.0)
Monocyte %: 9.8 %
Neutrophil #: 3 x10 3/mm (ref 1.4–6.5)
Neutrophil %: 61.9 %
Platelet: 251 x10 3/mm (ref 150–440)
RBC: 4.1 10*6/uL (ref 3.80–5.20)
RDW: 15.2 % — ABNORMAL HIGH (ref 11.5–14.5)
WBC: 4.8 x10 3/mm (ref 3.6–11.0)

## 2013-03-07 ENCOUNTER — Ambulatory Visit: Payer: Self-pay | Admitting: Hematology and Oncology

## 2013-03-08 LAB — CBC CANCER CENTER
Basophil %: 0.5 %
Eosinophil %: 1.7 %
Lymphocyte #: 1.2 x10 3/mm (ref 1.0–3.6)
MCH: 28.4 pg (ref 26.0–34.0)
MCV: 86 fL (ref 80–100)
Monocyte %: 12.2 %
Neutrophil #: 2.7 x10 3/mm (ref 1.4–6.5)
RDW: 14.8 % — ABNORMAL HIGH (ref 11.5–14.5)
WBC: 4.5 x10 3/mm (ref 3.6–11.0)

## 2013-03-16 LAB — CBC CANCER CENTER
Basophil #: 0 x10 3/mm (ref 0.0–0.1)
Eosinophil #: 0.1 x10 3/mm (ref 0.0–0.7)
Eosinophil %: 3 %
HCT: 36.9 % (ref 35.0–47.0)
Lymphocyte #: 0.9 x10 3/mm — ABNORMAL LOW (ref 1.0–3.6)
Lymphocyte %: 22.4 %
MCH: 27.4 pg (ref 26.0–34.0)
MCHC: 32.2 g/dL (ref 32.0–36.0)
MCV: 85 fL (ref 80–100)
Monocyte #: 0.5 x10 3/mm (ref 0.2–0.9)
Monocyte %: 11.6 %
Neutrophil #: 2.4 x10 3/mm (ref 1.4–6.5)
Platelet: 223 x10 3/mm (ref 150–440)
RBC: 4.34 10*6/uL (ref 3.80–5.20)
RDW: 14.8 % — ABNORMAL HIGH (ref 11.5–14.5)

## 2013-03-23 LAB — CBC CANCER CENTER
Basophil #: 0 x10 3/mm (ref 0.0–0.1)
Basophil %: 0.4 %
Eosinophil #: 0.1 x10 3/mm (ref 0.0–0.7)
Eosinophil %: 2.4 %
Lymphocyte #: 0.8 x10 3/mm — ABNORMAL LOW (ref 1.0–3.6)
Lymphocyte %: 21.5 %
MCHC: 33 g/dL (ref 32.0–36.0)
Monocyte #: 0.4 x10 3/mm (ref 0.2–0.9)
Neutrophil #: 2.4 x10 3/mm (ref 1.4–6.5)
Neutrophil %: 64.1 %
RBC: 4.56 10*6/uL (ref 3.80–5.20)
RDW: 14.9 % — ABNORMAL HIGH (ref 11.5–14.5)

## 2013-03-30 LAB — CBC CANCER CENTER
Basophil #: 0 x10 3/mm (ref 0.0–0.1)
Basophil %: 0.8 %
Eosinophil #: 0.1 x10 3/mm (ref 0.0–0.7)
Eosinophil %: 2.7 %
HCT: 36.3 % (ref 35.0–47.0)
HGB: 12 g/dL (ref 12.0–16.0)
Lymphocyte #: 0.7 x10 3/mm — ABNORMAL LOW (ref 1.0–3.6)
MCHC: 33 g/dL (ref 32.0–36.0)
Monocyte #: 0.4 x10 3/mm (ref 0.2–0.9)
Platelet: 199 x10 3/mm (ref 150–440)

## 2013-04-06 LAB — CBC CANCER CENTER
Basophil %: 0.3 %
Eosinophil #: 0.1 x10 3/mm (ref 0.0–0.7)
Eosinophil %: 2 %
HCT: 39.7 % (ref 35.0–47.0)
HGB: 13.1 g/dL (ref 12.0–16.0)
Lymphocyte #: 0.7 x10 3/mm — ABNORMAL LOW (ref 1.0–3.6)
Lymphocyte %: 16.9 %
MCHC: 32.9 g/dL (ref 32.0–36.0)
MCV: 84 fL (ref 80–100)
Monocyte %: 11.3 %
Neutrophil #: 2.9 x10 3/mm (ref 1.4–6.5)
Neutrophil %: 69.5 %
Platelet: 191 x10 3/mm (ref 150–440)
RDW: 14.7 % — ABNORMAL HIGH (ref 11.5–14.5)

## 2013-04-07 ENCOUNTER — Ambulatory Visit: Payer: Self-pay | Admitting: Hematology and Oncology

## 2013-04-07 DIAGNOSIS — I890 Lymphedema, not elsewhere classified: Secondary | ICD-10-CM

## 2013-04-07 HISTORY — DX: Lymphedema, not elsewhere classified: I89.0

## 2013-04-13 LAB — CBC CANCER CENTER
Basophil #: 0 x10 3/mm (ref 0.0–0.1)
Basophil %: 0.4 %
EOS ABS: 0.1 x10 3/mm (ref 0.0–0.7)
EOS PCT: 2.3 %
HCT: 38.9 % (ref 35.0–47.0)
HGB: 12.5 g/dL (ref 12.0–16.0)
LYMPHS ABS: 0.7 x10 3/mm — AB (ref 1.0–3.6)
Lymphocyte %: 17 %
MCH: 27.1 pg (ref 26.0–34.0)
MCHC: 32.1 g/dL (ref 32.0–36.0)
MCV: 84 fL (ref 80–100)
Monocyte #: 0.5 x10 3/mm (ref 0.2–0.9)
Monocyte %: 11.2 %
Neutrophil #: 3 x10 3/mm (ref 1.4–6.5)
Neutrophil %: 69.1 %
Platelet: 214 x10 3/mm (ref 150–440)
RBC: 4.62 10*6/uL (ref 3.80–5.20)
RDW: 14.8 % — ABNORMAL HIGH (ref 11.5–14.5)
WBC: 4.3 x10 3/mm (ref 3.6–11.0)

## 2013-05-06 LAB — CBC CANCER CENTER
Basophil #: 0 x10 3/mm (ref 0.0–0.1)
Basophil %: 1 %
Eosinophil #: 0.1 x10 3/mm (ref 0.0–0.7)
Eosinophil %: 2.5 %
HCT: 36 % (ref 35.0–47.0)
HGB: 11.9 g/dL — AB (ref 12.0–16.0)
Lymphocyte #: 1.1 x10 3/mm (ref 1.0–3.6)
Lymphocyte %: 29.9 %
MCH: 27.2 pg (ref 26.0–34.0)
MCHC: 33 g/dL (ref 32.0–36.0)
MCV: 82 fL (ref 80–100)
MONOS PCT: 12.5 %
Monocyte #: 0.5 x10 3/mm (ref 0.2–0.9)
NEUTROS ABS: 2 x10 3/mm (ref 1.4–6.5)
Neutrophil %: 54.1 %
Platelet: 177 x10 3/mm (ref 150–440)
RBC: 4.37 10*6/uL (ref 3.80–5.20)
RDW: 14.3 % (ref 11.5–14.5)
WBC: 3.8 x10 3/mm (ref 3.6–11.0)

## 2013-05-06 LAB — COMPREHENSIVE METABOLIC PANEL
ALK PHOS: 78 U/L
ALT: 20 U/L (ref 12–78)
AST: 15 U/L (ref 15–37)
Albumin: 3.3 g/dL — ABNORMAL LOW (ref 3.4–5.0)
Anion Gap: 7 (ref 7–16)
BILIRUBIN TOTAL: 0.2 mg/dL (ref 0.2–1.0)
BUN: 14 mg/dL (ref 7–18)
Calcium, Total: 8.5 mg/dL (ref 8.5–10.1)
Chloride: 105 mmol/L (ref 98–107)
Co2: 30 mmol/L (ref 21–32)
Creatinine: 0.81 mg/dL (ref 0.60–1.30)
EGFR (Non-African Amer.): >60
Glucose: 88 mg/dL (ref 65–99)
Osmolality: 283 (ref 275–301)
Potassium: 3.6 mmol/L (ref 3.5–5.1)
Sodium: 142 mmol/L (ref 136–145)
Total Protein: 7 g/dL (ref 6.4–8.2)

## 2013-05-08 ENCOUNTER — Ambulatory Visit: Payer: Self-pay | Admitting: Hematology and Oncology

## 2013-06-20 ENCOUNTER — Encounter: Payer: Self-pay | Admitting: Orthopedic Surgery

## 2013-07-06 ENCOUNTER — Encounter: Payer: Self-pay | Admitting: Orthopedic Surgery

## 2013-07-07 ENCOUNTER — Encounter: Payer: Self-pay | Admitting: General Surgery

## 2013-07-07 ENCOUNTER — Ambulatory Visit: Payer: Self-pay | Admitting: General Surgery

## 2013-07-11 ENCOUNTER — Ambulatory Visit: Payer: Self-pay | Admitting: Hematology and Oncology

## 2013-07-11 LAB — HM PAP SMEAR: HM Pap smear: NEGATIVE

## 2013-07-19 ENCOUNTER — Encounter: Payer: Self-pay | Admitting: General Surgery

## 2013-07-19 ENCOUNTER — Ambulatory Visit (INDEPENDENT_AMBULATORY_CARE_PROVIDER_SITE_OTHER): Payer: BC Managed Care – PPO | Admitting: General Surgery

## 2013-07-19 VITALS — BP 142/86 | HR 80 | Resp 12 | Ht 61.0 in | Wt 171.0 lb

## 2013-07-19 DIAGNOSIS — C50919 Malignant neoplasm of unspecified site of unspecified female breast: Secondary | ICD-10-CM

## 2013-07-19 NOTE — Patient Instructions (Signed)
Continue self breast exams. Call office for any new breast issues or concerns. 

## 2013-07-19 NOTE — Progress Notes (Signed)
Patient ID: Natalie Gallagher, female   DOB: 1970/10/25, 43 y.o.   MRN: [id number]  Chief Complaint  Patient presents with  . Follow-up    mammogram    HPI Natalie Gallagher Overall is a 43 y.o. female.  who presents for her follow up breast evaluation. The most recent mammogram was done on 07-07-13.  Patient does perform regular self breast checks and gets regular mammograms done.  No new breast issues. Tolerating the tamoxifen.  Appointment with Dr Oliva Bustard is for June 2015.  HPI  Past Medical History  Diagnosis Date  . Hypertension   . Cancer 2014    right breast.right wide excision with sentinal node biopsy on 06/21/12  . Breast cancer of upper-outer quadrant of right female breast March 2014    T1c,N0,M0; BRCA neg. ER: 60%; PR: 70%, her 2 neu not over expressed.     Past Surgical History  Procedure Laterality Date  . Breast surgery Right 06/21/2012    right wide excision with sentinal node biopsy/axilary dissection.    No family history on file.  Social History History  Substance Use Topics  . Smoking status: Never Smoker   . Smokeless tobacco: Not on file  . Alcohol Use: No    Allergies  Allergen Reactions  . Tape     The patient showed a cutaneous reaction to Telfa applied beneath a Tegaderm dressing. Telfa appears to be the primary offensive material.    Current Outpatient Prescriptions  Medication Sig Dispense Refill  . Multiple Vitamins-Calcium (VIACTIV MULTI-VITAMIN PO) Take 2 tablets by mouth daily.      . tamoxifen (NOLVADEX) 20 MG tablet        No current facility-administered medications for this visit.    Review of Systems Review of Systems  Constitutional: Negative.   Respiratory: Negative.   Cardiovascular: Negative.     Blood pressure 142/86, pulse 80, resp. rate 12, height _0  (1.549 m), weight 171 lb (77.565 kg).  Physical Exam Physical Exam  Constitutional: She is oriented to person, place, and time. She appears well-developed and well-nourished.  Neck:  Neck supple.  Cardiovascular: Normal rate, regular rhythm and normal heart sounds.   Pulmonary/Chest: Effort normal and breath sounds normal. Right breast exhibits skin change. Right breast exhibits no inverted nipple, no mass, no nipple discharge and no tenderness. Left breast exhibits no inverted nipple, no mass, no nipple discharge, no skin change and no tenderness.  peau d' orange changes right breast. Pigmented skin changes remain.  Lymphadenopathy:    She has no cervical adenopathy.    She has no axillary adenopathy.  Neurological: She is alert and oriented to person, place, and time.  Skin: Skin is warm and dry.    Data Reviewed Bilateral mammograms dated 07/07/2013 were reviewed. The skin thickening as well as calcifications thought secondary to fat necrosis were identified. BI-RAD-2.  Assessment    Doing well now one year status post treatment for her T1c carcinoma the right breast.    Plan    Pigment changes as well as diffuse skin thickening will hopefully continue to resolve over time. Patient is presently asymptomatic. Reasonable preservation of breast volume with slight loss laterally presently asymptomatic and not warranting surgical intervention. We'll plan for a followup examination in one year. The patient desires to continue having her mammograms completed at [facility].     PCP: [facility] Dr. Delford Field 07/20/2013, 7:31 AM

## 2013-07-20 ENCOUNTER — Encounter: Payer: Self-pay | Admitting: General Surgery

## 2013-07-20 DIAGNOSIS — C50911 Malignant neoplasm of unspecified site of right female breast: Secondary | ICD-10-CM

## 2013-07-20 DIAGNOSIS — Z853 Personal history of malignant neoplasm of breast: Secondary | ICD-10-CM | POA: Insufficient documentation

## 2013-07-20 DIAGNOSIS — Z170 Estrogen receptor positive status [ER+]: Secondary | ICD-10-CM

## 2013-08-03 LAB — COMPREHENSIVE METABOLIC PANEL
ALBUMIN: 3.4 g/dL (ref 3.4–5.0)
ALT: 24 U/L (ref 12–78)
ANION GAP: 7 (ref 7–16)
AST: 16 U/L (ref 15–37)
Alkaline Phosphatase: 80 U/L
BUN: 14 mg/dL (ref 7–18)
Bilirubin,Total: 0.2 mg/dL (ref 0.2–1.0)
CALCIUM: 9.6 mg/dL (ref 8.5–10.1)
CHLORIDE: 107 mmol/L (ref 98–107)
CREATININE: 0.88 mg/dL (ref 0.60–1.30)
Co2: 30 mmol/L (ref 21–32)
EGFR (African American): >60
EGFR (Non-African Amer.): >60
Glucose: 77 mg/dL (ref 65–99)
Osmolality: 286 (ref 275–301)
Potassium: 3.8 mmol/L (ref 3.5–5.1)
Sodium: 144 mmol/L (ref 136–145)
TOTAL PROTEIN: 7.2 g/dL (ref 6.4–8.2)

## 2013-08-03 LAB — CBC CANCER CENTER
Basophil #: 0.1 x10 3/mm (ref 0.0–0.1)
Basophil %: 1.4 %
Eosinophil #: 0 x10 3/mm (ref 0.0–0.7)
Eosinophil %: 1.1 %
HCT: 36.7 % (ref 35.0–47.0)
HGB: 12.6 g/dL (ref 12.0–16.0)
LYMPHS ABS: 1.4 x10 3/mm (ref 1.0–3.6)
Lymphocyte %: 31.6 %
MCH: 29.1 pg (ref 26.0–34.0)
MCHC: 34.3 g/dL (ref 32.0–36.0)
MCV: 85 fL (ref 80–100)
MONO ABS: 0.4 x10 3/mm (ref 0.2–0.9)
MONOS PCT: 7.9 %
NEUTROS PCT: 58 %
Neutrophil #: 2.7 x10 3/mm (ref 1.4–6.5)
PLATELETS: 165 x10 3/mm (ref 150–440)
RBC: 4.34 10*6/uL (ref 3.80–5.20)
RDW: 13.9 % (ref 11.5–14.5)
WBC: 4.6 x10 3/mm (ref 3.6–11.0)

## 2013-08-04 LAB — CANCER ANTIGEN 27.29: CA 27.29: 13.5 U/mL (ref 0.0–38.6)

## 2013-08-05 ENCOUNTER — Ambulatory Visit: Payer: Self-pay | Admitting: Hematology and Oncology

## 2013-08-05 ENCOUNTER — Encounter: Payer: Self-pay | Admitting: Orthopedic Surgery

## 2013-09-05 ENCOUNTER — Ambulatory Visit: Payer: Self-pay | Admitting: Hematology and Oncology

## 2013-10-20 ENCOUNTER — Ambulatory Visit: Payer: Self-pay | Admitting: Radiation Oncology

## 2013-11-09 ENCOUNTER — Ambulatory Visit: Payer: Self-pay | Admitting: Hematology and Oncology

## 2013-11-09 LAB — COMPREHENSIVE METABOLIC PANEL
ALBUMIN: 3.1 g/dL — AB (ref 3.4–5.0)
ALK PHOS: 63 U/L
AST: 14 U/L — AB (ref 15–37)
Anion Gap: 6 — ABNORMAL LOW (ref 7–16)
BUN: 18 mg/dL (ref 7–18)
Bilirubin,Total: 0.2 mg/dL (ref 0.2–1.0)
Calcium, Total: 8.7 mg/dL (ref 8.5–10.1)
Chloride: 106 mmol/L (ref 98–107)
Co2: 31 mmol/L (ref 21–32)
Creatinine: 0.8 mg/dL (ref 0.60–1.30)
EGFR (Non-African Amer.): >60
GLUCOSE: 93 mg/dL (ref 65–99)
Osmolality: 287 (ref 275–301)
POTASSIUM: 3.8 mmol/L (ref 3.5–5.1)
SGPT (ALT): 22 U/L
Sodium: 143 mmol/L (ref 136–145)
TOTAL PROTEIN: 7.2 g/dL (ref 6.4–8.2)

## 2013-11-09 LAB — CBC CANCER CENTER
BASOS PCT: 1 %
Basophil #: 0 x10 3/mm (ref 0.0–0.1)
Eosinophil #: 0 x10 3/mm (ref 0.0–0.7)
Eosinophil %: 0.6 %
HCT: 39.3 % (ref 35.0–47.0)
HGB: 12.8 g/dL (ref 12.0–16.0)
LYMPHS ABS: 1.2 x10 3/mm (ref 1.0–3.6)
Lymphocyte %: 26.3 %
MCH: 29.2 pg (ref 26.0–34.0)
MCHC: 32.7 g/dL (ref 32.0–36.0)
MCV: 89 fL (ref 80–100)
MONOS PCT: 7.5 %
Monocyte #: 0.4 x10 3/mm (ref 0.2–0.9)
NEUTROS PCT: 64.6 %
Neutrophil #: 3.1 x10 3/mm (ref 1.4–6.5)
PLATELETS: 194 x10 3/mm (ref 150–440)
RBC: 4.39 10*6/uL (ref 3.80–5.20)
RDW: 13.1 % (ref 11.5–14.5)
WBC: 4.7 x10 3/mm (ref 3.6–11.0)

## 2013-11-10 LAB — CANCER ANTIGEN 27.29: CA 27.29: 17.5 U/mL (ref 0.0–38.6)

## 2013-12-06 ENCOUNTER — Ambulatory Visit: Payer: Self-pay | Admitting: Hematology and Oncology

## 2014-01-05 ENCOUNTER — Ambulatory Visit: Payer: Self-pay | Admitting: Hematology and Oncology

## 2014-02-06 ENCOUNTER — Encounter: Payer: Self-pay | Admitting: General Surgery

## 2014-02-08 ENCOUNTER — Encounter: Payer: Self-pay | Admitting: General Surgery

## 2014-02-08 ENCOUNTER — Ambulatory Visit (INDEPENDENT_AMBULATORY_CARE_PROVIDER_SITE_OTHER): Payer: BC Managed Care – PPO | Admitting: General Surgery

## 2014-02-08 VITALS — BP 158/82 | HR 72 | Resp 12 | Ht 61.0 in | Wt 171.0 lb

## 2014-02-08 DIAGNOSIS — C50911 Malignant neoplasm of unspecified site of right female breast: Secondary | ICD-10-CM

## 2014-02-08 NOTE — Patient Instructions (Addendum)
Patient to return as scheduled.   Follow up in April 2016 as scheduled.

## 2014-02-08 NOTE — Progress Notes (Signed)
Patient ID: Natalie Gallagher, female   DOB: [birth year], 43 y.o.   MRN: [id number]  Chief Complaint  Patient presents with  . Procedure    port removal     HPI Natalie Gallagher is a 43 y.o. female here today for a port removal. Last chemotherapy was Jan 25, 2013. The patient reports feeling well. She is now 18 months out from her resection. HPI  Past Medical History  Diagnosis Date  . Hypertension   . Cancer 2014    right breast.right wide excision with sentinal node biopsy on 06/21/12  . Breast cancer of upper-outer quadrant of right female breast March 2014    T1c,N0,M0; BRCA neg. ER: 60%; PR: 70%, her 2 neu not over expressed.     Past Surgical History  Procedure Laterality Date  . Breast surgery Right 06/21/2012    right wide excision with sentinal node biopsy/axilary dissection.    No family history on file.  Social History History  Substance Use Topics  . Smoking status: Never Smoker   . Smokeless tobacco: Not on file  . Alcohol Use: No    Allergies  Allergen Reactions  . Tape     The patient showed a cutaneous reaction to Telfa applied beneath a Tegaderm dressing. Telfa appears to be the primary offensive material.    Current Outpatient Prescriptions  Medication Sig Dispense Refill  . Multiple Vitamins-Calcium (VIACTIV MULTI-VITAMIN PO) Take 2 tablets by mouth daily.    . tamoxifen (NOLVADEX) 20 MG tablet      No current facility-administered medications for this visit.    Review of Systems Review of Systems  Constitutional: Negative.   Respiratory: Negative.   Cardiovascular: Negative.     Blood pressure 158/82, pulse 72, resp. rate 12, height $RemoveBe'5\' 1"'GhbDPTTBA$  (1.549 m), weight 171 lb (77.565 kg).  Physical Exam Physical Exam  Constitutional: She is oriented to person, place, and time. She appears well-developed and well-nourished.  Neck: Neck supple.  Cardiovascular: Normal rate, regular rhythm and normal heart sounds.   Pulmonary/Chest: Effort normal and breath  sounds normal.    Minimal volume loss right breast  Lymphadenopathy:    She has no cervical adenopathy.  Neurological: She is alert and oriented to person, place, and time.  Skin: Skin is warm and dry.    Data Reviewed Request for port removal has been obtained from her medical oncologist.  Assessment    Doing well now 18 months status post breast conservation for T1c, N0 carcinoma of the right breast.     Plan    The area was prepped with alcohol followed by 10 mL of 0.5% Xylocaine with 0.25% Marcaine with 1-200,000 units epinephrine. This was supplemented with 5 mL of 1% plain Xylocaine. The old incision was opened and the port exposed. Transfixion sutures were divided  And extracted. The port was removed with an intact tip. The wound was closed in layers with a running 3-0 Vicryl to the adipose tissue and a running 3-0 Vicryl septic suture for the skin. Benzoin and Steri-Strips followed by Telfa and Tegaderm dressing was applied. The patient tolerated procedure well. Ice pack provided. She was instructed in regard to postoperative wound care. She'll return in one week for wound evaluation with the staff.   Follow up in April 2016 as scheduled.  PCP:  Shanna Cisco NP [place] 02/10/2014, 3:06 PM

## 2014-02-10 ENCOUNTER — Encounter: Payer: Self-pay | Admitting: General Surgery

## 2014-02-15 ENCOUNTER — Encounter: Payer: Self-pay | Admitting: General Surgery

## 2014-02-15 ENCOUNTER — Ambulatory Visit (INDEPENDENT_AMBULATORY_CARE_PROVIDER_SITE_OTHER): Payer: Self-pay | Admitting: *Deleted

## 2014-02-15 DIAGNOSIS — C50911 Malignant neoplasm of unspecified site of right female breast: Secondary | ICD-10-CM

## 2014-02-15 NOTE — Progress Notes (Signed)
Patient came in today for a wound check/port removal.  The wound is clean, with no signs of infection noted. Small stitch removed at distal end. Follow up as scheduled.

## 2014-02-15 NOTE — Patient Instructions (Signed)
Continue to monitor area

## 2014-03-09 ENCOUNTER — Ambulatory Visit (INDEPENDENT_AMBULATORY_CARE_PROVIDER_SITE_OTHER): Payer: Self-pay | Admitting: *Deleted

## 2014-03-09 DIAGNOSIS — C50911 Malignant neoplasm of unspecified site of right female breast: Secondary | ICD-10-CM

## 2014-03-09 NOTE — Progress Notes (Signed)
Patient came in today for a wound check/port removal. The wound is clean, with no signs of infection noted. Tiny open area where stitch was removed at distal end remains. She states a scab has formed but not sure if stitch still there. Scab removed and no signs of stitch seen. Steri strips applied. Pt agrees to monitor the area and call for new issues or concerns.

## 2014-05-11 ENCOUNTER — Ambulatory Visit: Payer: Self-pay | Admitting: Radiation Oncology

## 2014-05-12 LAB — CBC CANCER CENTER
BASOS PCT: 0.4 %
Basophil #: 0 x10 3/mm (ref 0.0–0.1)
EOS PCT: 1.6 %
Eosinophil #: 0.1 x10 3/mm (ref 0.0–0.7)
HCT: 37.2 % (ref 35.0–47.0)
HGB: 12.5 g/dL (ref 12.0–16.0)
LYMPHS ABS: 1.5 x10 3/mm (ref 1.0–3.6)
Lymphocyte %: 18.4 %
MCH: 29 pg (ref 26.0–34.0)
MCHC: 33.5 g/dL (ref 32.0–36.0)
MCV: 87 fL (ref 80–100)
MONOS PCT: 7.9 %
Monocyte #: 0.7 x10 3/mm (ref 0.2–0.9)
NEUTROS ABS: 6 x10 3/mm (ref 1.4–6.5)
Neutrophil %: 71.7 %
Platelet: 219 x10 3/mm (ref 150–440)
RBC: 4.3 10*6/uL (ref 3.80–5.20)
RDW: 12.9 % (ref 11.5–14.5)
WBC: 8.3 x10 3/mm (ref 3.6–11.0)

## 2014-05-12 LAB — COMPREHENSIVE METABOLIC PANEL
ALT: 21 U/L (ref 14–63)
Albumin: 3.1 g/dL — ABNORMAL LOW (ref 3.4–5.0)
Alkaline Phosphatase: 59 U/L (ref 46–116)
Anion Gap: 6 — ABNORMAL LOW (ref 7–16)
BILIRUBIN TOTAL: 0.2 mg/dL (ref 0.2–1.0)
BUN: 14 mg/dL (ref 7–18)
CO2: 32 mmol/L (ref 21–32)
Calcium, Total: 8.7 mg/dL (ref 8.5–10.1)
Chloride: 106 mmol/L (ref 98–107)
Creatinine: 0.93 mg/dL (ref 0.60–1.30)
EGFR (Non-African Amer.): >60
GLUCOSE: 75 mg/dL (ref 65–99)
OSMOLALITY: 286 (ref 275–301)
Potassium: 3.8 mmol/L (ref 3.5–5.1)
SGOT(AST): 9 U/L — ABNORMAL LOW (ref 15–37)
Sodium: 144 mmol/L (ref 136–145)
TOTAL PROTEIN: 7.2 g/dL (ref 6.4–8.2)

## 2014-05-13 LAB — CANCER ANTIGEN 27.29: CA 27.29: 14.2 U/mL (ref 0.0–38.6)

## 2014-05-16 ENCOUNTER — Ambulatory Visit: Payer: Managed Care, Other (non HMO) | Attending: Radiation Oncology | Admitting: Physical Therapy

## 2014-05-16 DIAGNOSIS — E8989 Other postprocedural endocrine and metabolic complications and disorders: Secondary | ICD-10-CM

## 2014-05-16 DIAGNOSIS — I890 Lymphedema, not elsewhere classified: Secondary | ICD-10-CM | POA: Diagnosis not present

## 2014-05-16 NOTE — Therapy (Signed)
[facility] [address] [place], [place], [address] Phone: [phone]   Fax:  [phone]  Physical Therapy Evaluation  Patient Details  Name: Natalie Gallagher MRN: [id number] Date of Birth: [birth year] Referring Provider:  Carmina Miller, MD  Encounter Date: 05/16/2014      PT End of Session - 05/16/14 1445    Visit Number 1   Number of Visits 25   Date for PT Re-Evaluation 07/14/14   PT Start Time 1350   PT Stop Time 1440   PT Time Calculation (min) 50 min      Past Medical History  Diagnosis Date  . Hypertension   . Cancer 2014    right breast.right wide excision with sentinal node biopsy on 06/21/12  . Breast cancer of upper-outer quadrant of right female breast March 2014    T1c,N0,M0; BRCA neg. ER: 60%; PR: 70%, her 2 neu not over expressed.     Past Surgical History  Procedure Laterality Date  . Breast surgery Right 06/21/2012    right wide excision with sentinal node biopsy/axilary dissection.    There were no vitals taken for this visit.  Visit Diagnosis:  Lymphedema of upper extremity following lymphadenectomy - Plan: PT plan of care cert/re-cert      Subjective Assessment - 05/16/14 1351    Symptoms Upper right forearm is the area  that patient notes a concentration of swelling.   Pertinent History Diagnosed in February 2014 with right breast cancer; had lumpectomy in March with 13 lymph nodes removed (all negative), then chemo May-October, and then November to January 2015, radiation.  Pt. went to [facility] for lymphedema therapy in June 2015; got a compression sleeve and was taught how to do manual lymph drainage, then was followed intermittently since then.   Currently in Pain? No/denies          [facility] PT Assessment - 05/16/14 0001    Assessment   Medical Diagnosis right breast cancer   Onset Date 05/08/12   Precautions   Precautions Other (comment)  cancer precautions   Restrictions   Weight  Bearing Restrictions No   Balance Screen   Has the patient fallen in the past 6 months No   Has the patient had a decrease in activity level because of a fear of falling?  No   Is the patient reluctant to leave their home because of a fear of falling?  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   Leisure --  had gone back to workouts, but stopped when lymphedema flare   Observation/Other Assessments   Observations Has Elvarex soft custom compression sleeve, compression class II; also has a glove   Skin Integrity skin intact; does have bulbous point of swelling just distal to right elbow;   Other Surveys  Select   AROM   Overall AROM  Within functional limits for tasks performed  for bilateral shoulder AROM           LYMPHEDEMA/ONCOLOGY QUESTIONNAIRE - 05/16/14 1404    Lymphedema Stage   Stage STAGE 2 SPONTANEOUSLY IRREVERSIBLE   Lymphedema Assessments   Lymphedema Assessments Upper extremities   Right Upper Extremity Lymphedema   15 cm Proximal to Olecranon Process 37 cm   10 cm Proximal to Olecranon Process 33.7 cm   Olecranon Process 25.8 cm  15 cm Proximal to Ulnar Styloid Process 27.9 cm   10 cm Proximal to Ulnar Styloid Process 25.7 cm   Just Proximal to Ulnar Styloid Process 15.6 cm   Across Hand at [facility] 18.1 cm   At [place] of 2nd Digit 6.3 cm   Other swelling is non-pitting; negative Stemmer's sign   Left Upper Extremity Lymphedema   15 cm Proximal to Olecranon Process 36.8 cm   10 cm Proximal to Olecranon Process 34.3 cm   Olecranon Process 25.2 cm   15 cm Proximal to Ulnar Styloid Process 24.6 cm   10 cm Proximal to Ulnar Styloid Process 22.5 cm   Just Proximal to Ulnar Styloid Process 15 cm   Across Hand at [facility] 17.4 cm   At [place] of 2nd Digit 5.8 cm            Quick Dash - 05/16/14 0001    Open a tight or new jar No difficulty   Do heavy household chores (wash walls, wash floors) No difficulty   Carry a shopping bag or briefcase No difficulty   Wash your back Mild difficulty   Use a knife to cut food No difficulty   During the past week, to what extent has your arm, shoulder or hand problem interfered with your normal social activities with family, friends, neighbors, or groups? Not at all   During the past week, to what extent has your arm, shoulder or hand problem limited your work or other regular daily activities Not at all   Arm, shoulder, or hand pain. None   Tingling (pins and needles) in your arm, shoulder, or hand None   Difficulty Sleeping No difficulty   DASH Score 0 %       Spent some time today discussing with patient the treatment she has already had for lymphedema and how that might be supplemented.  Discussed equipment she has in place (custom daytime garment, off-the-shelf nighttime garment, and some education about self-manual lymph drainage).  Educated her to some extent about the Flexitouch pump.  Also talked about [place] [employer]ymphed[employer] Act that requires coverage by her insurance company for compression garments, though deductibles might be the issue with garments she has recently ordered. Discussed exercise, including what she has done in the past and would like to return to.  Suggested the strength ABC program could help her body gradually adjust to increasing lymphatic load.  She did ask about doing zumba now; I agreed that if she wears her compression garments she should be able to do this without risk, based in part on her claim that she doesn't find that zumba is particularly tiring nor does she work up much of a sweat in doing it. Discussed and agreed on treatment plan below.                Short Term Clinic Goals - 05/16/14 1450    CC Short Term Goal  #1   Title Pt. will show a reduction in  circumference of right forearm at 15 cm. proximal to ulnar styloid of at least 1 cm.   Baseline 27.9 cm.   Time 3   Period Weeks   Status New             Long Term Clinic Goals - 05/16/14 1450    CC Long Term Goal  #1   Title Pt. will show a reduction of at least 1.5 cm. in circumference of right forearm at 15 cm.  proximal to ulnar styloid.   Time 6   Period Weeks   Status New   CC Long Term Goal  #2   Title Pt. will be knowledgeable about options for self-treatment/management of lymphedema, including Flexitouch pump and proper use of nighttime garment and/or bandaging.   Time 6   Period Weeks   Status New   CC Long Term Goal  #3   Title Pt. will be independent in correctly performing self-manual lymph drainage.   Time 6   Period Weeks   Status New   CC Long Term Goal  #4   Title Pt. will be independent in strength ABC program and knowledgeable about safe exercise practices re: lymphedema risk.   Time 6   Period Weeks   Status New            Plan - 05/16/14 1446    Clinical Impression Statement Pt. with mild right UE lymphedema but with a signficant swelling at proximal forearm has had treatment before, but wonders if she is doing all she can to control swelling.  She will benefit from a course of complete decongestive therapy to try to reduce right forearm.  She would also benefit from strength ABC program info.   Pt will benefit from skilled therapeutic intervention in order to improve on the following deficits Increased edema;Decreased knowledge of use of DME;Decreased knowledge of precautions   Rehab Potential Excellent   PT Frequency 3x / week   PT Duration 6 weeks   PT Treatment/Interventions Manual lymph drainage;Compression bandaging;Patient/family education;ADLs/Self Care Home Management;Therapeutic exercise   PT Next Visit Plan Begin complete decongestive therapy; review self-bandaging if appropriate (or at some future visit).  Plan to instruct in strength  ABC program.  Pt. will bring in her Reidsleeve for [place] to look at its fit and how she applies it.  Later, discuss Flexitouch further and whether she is interested in pursuing that.   Consulted and Agree with Plan of Care Patient         Problem List Patient Active Problem List   Diagnosis Date Noted  . Malignant neoplasm of breast (female), unspecified site 07/20/2013  . Cancer of female breast 06/27/2012    Mystery Schrupp 05/16/2014, 3:05 PM  [facility] [address] [place], [place], [address] Phone: [phone]   Fax:  [phone]  Micheline Maze, PT

## 2014-05-22 ENCOUNTER — Ambulatory Visit: Payer: Managed Care, Other (non HMO) | Admitting: Physical Therapy

## 2014-05-24 ENCOUNTER — Ambulatory Visit: Payer: Managed Care, Other (non HMO)

## 2014-05-24 DIAGNOSIS — I890 Lymphedema, not elsewhere classified: Principal | ICD-10-CM

## 2014-05-24 DIAGNOSIS — E8989 Other postprocedural endocrine and metabolic complications and disorders: Secondary | ICD-10-CM

## 2014-05-24 NOTE — Patient Instructions (Signed)

## 2014-05-24 NOTE — Therapy (Signed)
[place], [place], [address] Phone: [phone]   Fax:  [phone]  Physical Therapy Treatment  Patient Details  Name: Natalie Gallagher MRN: [id number] Date of Birth: 04-05-1971 Referring Provider:  Noreene Filbert, MD  Encounter Date: 05/24/2014      PT End of Session - 05/24/14 1531    Visit Number 2   Number of Visits 25   Date for PT Re-Evaluation 07/14/14   PT Start Time 7972   PT Stop Time 1520   PT Time Calculation (min) 45 min      Past Medical History  Diagnosis Date  . Hypertension   . Cancer 2014    right breast.right wide excision with sentinal node biopsy on 06/21/12  . Breast cancer of upper-outer quadrant of right female breast March 2014    T1c,N0,M0; BRCA neg. ER: 60%; PR: 70%, her 2 neu not over expressed.     Past Surgical History  Procedure Laterality Date  . Breast surgery Right 06/21/2012    right wide excision with sentinal node biopsy/axilary dissection.    There were no vitals taken for this visit.  Visit Diagnosis:  Lymphedema of upper extremity following lymphadenectomy      Subjective Assessment - 05/24/14 1535    Symptoms I will only be able to come 2x/wk on Mon and Wed due to my schedule. Willing to wrap and/or massage at home on day I cant come.                    [place] Adult PT Treatment/Exercise - 05/24/14 0001    Manual Therapy   Manual Lymphatic Drainage (MLD) In Supine: Short neck, Lt axilla and Rt inguinal nodes, anterior inter-axillary and Rt axillo-inguinal anastomosis, and Rt UE from dorsal hand to lateral shoulder instructing pt throughout treatment.   Compression Bandaging Biotone lotion applied to Rt UE, stockinette, Elastomull to first 4 fingers, Artiflex x1 with peach medi foam small dots at upper anterior foream and 1/4" gray foam at posterior and anterior forearm, and 1-6, 1-10 and 1-12 cm short stretch compression banadage from hand  to axilla.                PT Education - 05/24/14 1531    Education provided Yes   Education Details Self Manual lymph drainage   Person(s) Educated Patient   Methods Explanation;Demonstration;Tactile cues;Handout   Comprehension Verbalized understanding;Need further instruction           Short Term Clinic Goals - 05/16/14 1450    CC Short Term Goal  #1   Title Pt. will show a reduction in circumference of right forearm at 15 cm. proximal to ulnar styloid of at least 1 cm.   Baseline 27.9 cm.   Time 3   Period Weeks   Status New             Long Term Clinic Goals - 05/16/14 1450    CC Long Term Goal  #1   Title Pt. will show a reduction of at least 1.5 cm. in circumference of right forearm at 15 cm. proximal to ulnar styloid.   Time 6   Period Weeks   Status New   CC Long Term Goal  #2   Title Pt. will be knowledgeable about options for self-treatment/management of lymphedema, including Flexitouch pump and proper use of nighttime garment and/or bandaging.   Time 6   Period Weeks   Status New   CC Long  Term Goal  #3   Title Pt. will be independent in correctly performing self-manual lymph drainage.   Time 6   Period Weeks   Status New   CC Long Term Goal  #4   Title Pt. will be independent in strength ABC program and knowledgeable about safe exercise practices re: lymphedema risk.   Time 6   Period Weeks   Status New            Plan - 05/24/14 1532    Clinical Impression Statement Pt instructed in self manual lymph drainage for first time today as she reports this was not part of her treatment when at [facility]. Pt verbalized good understanding of sequence and asked appropriate questions throughout. Began instructing in compression bandaging as this was done differently as well.   Pt will benefit from skilled therapeutic intervention in order to improve on the following deficits Increased edema;Decreased knowledge of use of DME;Decreased knowledge of  precautions   Rehab Potential Excellent   PT Frequency 3x / week   PT Duration 6 weeks   PT Treatment/Interventions Manual lymph drainage;Compression bandaging;Patient/family education;ADLs/Self Care Home Management;Therapeutic exercise   PT Next Visit Plan Continue complete decongestive therapy; review self-bandaging.  Plan to instruct in strength ABC program.  Pt. will bring in her Reidsleeve for us to look at its fit and how she applies it.  Later, discuss Flexitouch further and whether she is interested in pursuing that, per pt, if insurance will cover but she doesnt think it will.   Consulted and Agree with Plan of Care Patient        Problem List Patient Active Problem List   Diagnosis Date Noted  . Malignant neoplasm of breast (female), unspecified site 07/20/2013  . Cancer of female breast 06/27/2012    Rosenberger, Valerie Ann, PTA 05/24/2014, 3:43 PM  [facility] [address] [place], [place], [address] Phone: [phone]   Fax:  [phone]      

## 2014-05-26 ENCOUNTER — Ambulatory Visit: Payer: Managed Care, Other (non HMO) | Admitting: Physical Therapy

## 2014-05-29 ENCOUNTER — Ambulatory Visit: Payer: Managed Care, Other (non HMO) | Admitting: Physical Therapy

## 2014-05-29 DIAGNOSIS — E8989 Other postprocedural endocrine and metabolic complications and disorders: Secondary | ICD-10-CM

## 2014-05-29 DIAGNOSIS — I890 Lymphedema, not elsewhere classified: Principal | ICD-10-CM

## 2014-05-29 NOTE — Therapy (Addendum)
[place], [place], [address] Phone: [phone]   Fax:  [phone]  Physical Therapy Treatment  Patient Details  Name: Natalie Gallagher MRN: [id number] Date of Birth: 1970/09/19 Referring Provider:  Noreene Filbert, MD  Encounter Date: 05/29/2014      PT End of Session - 05/29/14 1700    Visit Number 3   Number of Visits 25   Date for PT Re-Evaluation 07/14/14   PT Start Time 1430   PT Stop Time 1515   PT Time Calculation (min) 45 min      Past Medical History  Diagnosis Date  . Hypertension   . Cancer 2014    right breast.right wide excision with sentinal node biopsy on 06/21/12  . Breast cancer of upper-outer quadrant of right female breast March 2014    T1c,N0,M0; BRCA neg. ER: 60%; PR: 70%, her 2 neu not over expressed.     Past Surgical History  Procedure Laterality Date  . Breast surgery Right 06/21/2012    right wide excision with sentinal node biopsy/axilary dissection.    There were no vitals taken for this visit.  Visit Diagnosis:  Lymphedema of upper extremity following lymphadenectomy      Subjective Assessment - 05/29/14 1447    Symptoms hand bandages got loose and can off the next day. She comes in wearing her glove and bandages.     Pertinent History Diagnosed in February 2014 with right breast cancer; had lumpectomy in March with 13 lymph nodes removed (all negative), then chemo May-October, and then November to January 2015, radiation.  Pt. went to [facility] for lymphedema therapy in June 2015; got a compression sleeve and was taught how to do manual lymph drainage, then was followed intermittently since then.   Currently in Pain? No/denies      Prepared for treatement with cervical and upper thoracic range of motion.   Manual lymph drainage in supine as follows: short neck, left axillary nodes, right inguinal nodes, superficial and deep abdominals; anterior inter-axillary  anastamoses, right axillo-inguinal anastamoses. Right upper extremity from fingers and dorsal hand to lateral shoulder redirecting along pathways. Biotone applied. Thin stockinette with small dotted peach medi foam to forearm, 1/2 foam to back of upper arm 2 artiflex.  Her glove was on her hand. 3 short stretch bandages to arm. Herribone pattern used at forearm for extra support .           PT Education - 05/29/14 1709    Education provided Yes   Education Details website link for [employer]loset[employer] lymphedema education session   Person(s) Educated Patient   Methods Explanation  pt entered link information into her phone   Comprehension Verbalized understanding           Short Term Clinic Goals - 05/29/14 1711    CC Short Term Goal  #1   Title Pt. will show a reduction in circumference of right forearm at 15 cm. proximal to ulnar styloid of at least 1 cm.   Baseline 27.9 cm.   Time 3   Period Weeks   Status On-going             Long Term Clinic Goals - 05/29/14 1711    CC Long Term Goal  #1   Title Pt. will show a reduction of at least 1.5 cm. in circumference of right forearm at 15 cm. proximal to ulnar styloid.   Time 6   Period Weeks   Status On-going  CC Long Term Goal  #2   Title Pt. will be knowledgeable about options for self-treatment/management of lymphedema, including Flexitouch pump and proper use of nighttime garment and/or bandaging.   Time 6   Period Weeks   Status On-going   CC Long Term Goal  #3   Title Pt. will be independent in correctly performing self-manual lymph drainage.   Time 6   Period Weeks   Status On-going   CC Long Term Goal  #4   Title Pt. will be independent in strength ABC program and knowledgeable about safe exercise practices re: lymphedema risk.   Period Weeks   Status On-going            Plan - 05/29/14 1712    PT Next Visit Plan Continue complete decongestive therapy; discuss self manual lymph drainage and self  bandaging as does not have anyone to help her.  Instruct in Strength ABC program Remeasure arm        Problem List Patient Active Problem List   Diagnosis Date Noted  . Malignant neoplasm of breast (female), unspecified site 07/20/2013  . Cancer of female breast 06/27/2012   Donato Heinz. Owens Shark, PT  05/29/2014, 5:13 PM  [place], [place], [address] Phone: [phone]   Fax:  [phone]

## 2014-05-31 ENCOUNTER — Ambulatory Visit: Payer: Managed Care, Other (non HMO) | Admitting: Physical Therapy

## 2014-05-31 DIAGNOSIS — E8989 Other postprocedural endocrine and metabolic complications and disorders: Secondary | ICD-10-CM

## 2014-05-31 DIAGNOSIS — I890 Lymphedema, not elsewhere classified: Principal | ICD-10-CM

## 2014-05-31 NOTE — Therapy (Signed)
[facility] [address] [place], [place], [address] Phone: [phone]   Fax:  [phone]  Physical Therapy Treatment  Patient Details  Name: Natalie Gallagher MRN: [id number] Date of Birth: [birth year] Referring Provider:  Carmina Miller, MD  Encounter Date: 05/31/2014      PT End of Session - 05/31/14 1705    Visit Number 4   Number of Visits 25   Date for PT Re-Evaluation 07/14/14   PT Start Time 1429   PT Stop Time 1515   PT Time Calculation (min) 46 min   Activity Tolerance Patient tolerated treatment well   Behavior During Therapy [facility] for tasks assessed/performed      Past Medical History  Diagnosis Date  . Hypertension   . Cancer 2014    right breast.right wide excision with sentinal node biopsy on 06/21/12  . Breast cancer of upper-outer quadrant of right female breast March 2014    T1c,N0,M0; BRCA neg. ER: 60%; PR: 70%, her 2 neu not over expressed.     Past Surgical History  Procedure Laterality Date  . Breast surgery Right 06/21/2012    right wide excision with sentinal node biopsy/axilary dissection.    There were no vitals taken for this visit.  Visit Diagnosis:  Lymphedema of upper extremity following lymphadenectomy      Subjective Assessment - 05/31/14 1428    Symptoms Nothing's new.  Did okay with the glove on and bandages on arm.   Currently in Pain? No/denies             LYMPHEDEMA/ONCOLOGY QUESTIONNAIRE - 05/31/14 1434    Right Upper Extremity Lymphedema   10 cm Proximal to Olecranon Process 33.1 cm   Olecranon Process 25.2 cm   15 cm Proximal to Ulnar Styloid Process 27.2 cm   10 cm Proximal to Ulnar Styloid Process 24.6 cm   Just Proximal to Ulnar Styloid Process 15.6 cm   Across Hand at [facility] 18 cm   At [place] of 2nd Digit 6 cm               [facility] Adult PT Treatment/Exercise - 05/31/14 0001    Manual Therapy   Manual Therapy Edema management   Edema  Management Removed bandages and pt. washed arm; circumference measurements taken.  Skin check is fine; indentations from small dot peach Medi foam faded partially as patient was treated today.   Manual Lymphatic Drainage (MLD) In supine, short neck, superficial and deep abdomen, left axilla and anterior interaxillary anastomosis, right groin and axillo-inguinal anastomosis, and right UE from fingers to shoulder.   Compression Bandaging Lotion applied.  Pt. put her compression glove on, then bandaging of right UE with stockinette, 1/4 inch gray foam around forearm, at posterior upper arm, and anterior elbow, Artiflex x 2, 1-6 cm. bandage in herringbone around forearm, and 1-10 cm. and 1-12 cm. bandage from wrist to shoulder.                   Short Term Clinic Goals - 05/31/14 1709    CC Short Term Goal  #1   Title Pt. will show a reduction in circumference of right forearm at 15 cm. proximal to ulnar styloid of at least 1 cm.   Baseline 0.7 cm. reduction to date at this level (on 05/31/14) compared to 27.9 cm. initial measurement   Status On-going             Long Term Clinic Goals - 05/29/14  1711    CC Long Term Goal  #1   Title Pt. will show a reduction of at least 1.5 cm. in circumference of right forearm at 15 cm. proximal to ulnar styloid.   Time 6   Period Weeks   Status On-going   CC Long Term Goal  #2   Title Pt. will be knowledgeable about options for self-treatment/management of lymphedema, including Flexitouch pump and proper use of nighttime garment and/or bandaging.   Time 6   Period Weeks   Status On-going   CC Long Term Goal  #3   Title Pt. will be independent in correctly performing self-manual lymph drainage.   Time 6   Period Weeks   Status On-going   CC Long Term Goal  #4   Title Pt. will be independent in strength ABC program and knowledgeable about safe exercise practices re: lymphedema risk.   Period Weeks   Status On-going            Plan  - 05/31/14 1706    Clinical Impression Statement Pt. did well with some reductions measured, and hand was slightly smaller though it was in glove and not bandaged.  Pt. reports bandages have been staying on for several days, so declines learning to self-bandage today.  No peach Medi dotted foam used today, but rather just 1/4 inch grey foam on forearm.                                       Pt will benefit from skilled therapeutic intervention in order to improve on the following deficits Increased edema;Decreased knowledge of use of DME;Decreased knowledge of precautions   Rehab Potential Excellent   PT Frequency 3x / week  pt. has chosen to come 2x/week   PT Duration 6 weeks   PT Treatment/Interventions Manual lymph drainage;Compression bandaging;Patient/family education   PT Next Visit Plan Continue complete decongestive therapy; discuss self manual lymph drainage and self bandaging as does not have anyone to help her.  Begin strength ABC class as time allows.   Consulted and Agree with Plan of Care Patient        Problem List Patient Active Problem List   Diagnosis Date Noted  . Malignant neoplasm of breast (female), unspecified site 07/20/2013  . Cancer of female breast 06/27/2012    Siniyah Evangelist 05/31/2014, 5:12 PM  [facility] [address] [place], [place], [address] Phone: [phone]   Fax:  [phone]   Micheline Maze, PT

## 2014-06-01 ENCOUNTER — Ambulatory Visit: Payer: Managed Care, Other (non HMO) | Admitting: Physical Therapy

## 2014-06-05 ENCOUNTER — Encounter: Payer: Self-pay | Admitting: Physical Therapy

## 2014-06-05 ENCOUNTER — Ambulatory Visit: Payer: Managed Care, Other (non HMO) | Admitting: Physical Therapy

## 2014-06-05 DIAGNOSIS — I890 Lymphedema, not elsewhere classified: Principal | ICD-10-CM

## 2014-06-05 DIAGNOSIS — E8989 Other postprocedural endocrine and metabolic complications and disorders: Secondary | ICD-10-CM | POA: Diagnosis not present

## 2014-06-05 NOTE — Patient Instructions (Signed)
Instructed patient to monitor her right wrist related to pain due to her concerns of her abrasion turning into an infection.  Instructed her to doff bandages if pain occurs or she has concerns of an infection.  Today there is no sign of infection.  Patient verbalized understanding.

## 2014-06-05 NOTE — Therapy (Signed)
[place], [place], [address] Phone: [phone]   Fax:  [phone]  Physical Therapy Treatment  Patient Details  Name: Natalie Gallagher MRN: [id number] Date of Birth: 02/16/1971 Referring Provider:  Noreene Filbert, MD  Encounter Date: 06/05/2014      PT End of Session - 06/05/14 1507    Visit Number 5   Number of Visits 25   Date for PT Re-Evaluation 07/14/14   PT Start Time 1505   PT Stop Time 1607   PT Time Calculation (min) 62 min   Activity Tolerance Patient tolerated treatment well   Behavior During Therapy [facility] for tasks assessed/performed      Past Medical History  Diagnosis Date  . Hypertension   . Cancer 2014    right breast.right wide excision with sentinal node biopsy on 06/21/12  . Breast cancer of upper-outer quadrant of right female breast March 2014    T1c,N0,M0; BRCA neg. ER: 60%; PR: 70%, her 2 neu not over expressed.     Past Surgical History  Procedure Laterality Date  . Breast surgery Right 06/21/2012    right wide excision with sentinal node biopsy/axilary dissection.    There were no vitals taken for this visit.  Visit Diagnosis:  Lymphedema of upper extremity following lymphadenectomy      Subjective Assessment - 06/05/14 1516    Symptoms Patient concerned about her wrist.  It was very itchy but no pain.  When bandages were removed today, there was an abrasion on her medial right wrist from the tag of her glove rubbing on her skin.   Currently in Pain? No/denies                    [facility] Adult PT Treatment/Exercise - 06/05/14 0001    Manual Therapy   Manual Therapy Edema management   Edema Management Removed compression bandages and assessed skin.  Right medial wrist has area of abrasion due to tag of compression glove rubbing on her skin.  Otherwise, arm appears to be continuing to reduce.   Manual Lymphatic Drainage (MLD) In supine, short neck,  superficial and deep abdomen, left axilla and anterior interaxillary anastomosis, right groin and axillo-inguinal anastomosis, and right UE from fingers to shoulder.   Compression Bandaging Lotion applied.  Did not don compression glove due to abrasion on wrist from tag.  Applied 3x3" Allevyn pad over wrist abrasion. Applied bandaging of right UE with stockinette, elastomull to fingers 1-4, peach dotted foam to posterior forearm, 1/4 inch gray foam around forearm, at posterior upper arm, and anterior elbow, Artiflex x 2, 1-6 cm. bandage at wrist, and 1-10 cm on forearm. and 1-12 cm. bandage from wrist to shoulder..  No c/o discomfort.                PT Education - 06/05/14 1613    Education provided Yes   Education Details Infection education as stated in pt instructions section above   Person(s) Educated Patient   Methods Explanation   Comprehension Verbalized understanding           Short Term Clinic Goals - 05/31/14 1709    CC Short Term Goal  #1   Title Pt. will show a reduction in circumference of right forearm at 15 cm. proximal to ulnar styloid of at least 1 cm.   Baseline 0.7 cm. reduction to date at this level (on 05/31/14) compared to 27.9 cm. initial measurement   Status On-going  [facility] Goals - 05/29/14 1711    CC Long Term Goal  #1   Title Pt. will show a reduction of at least 1.5 cm. in circumference of right forearm at 15 cm. proximal to ulnar styloid.   Time 6   Period Weeks   Status On-going   CC Long Term Goal  #2   Title Pt. will be knowledgeable about options for self-treatment/management of lymphedema, including Flexitouch pump and proper use of nighttime garment and/or bandaging.   Time 6   Period Weeks   Status On-going   CC Long Term Goal  #3   Title Pt. will be independent in correctly performing self-manual lymph drainage.   Time 6   Period Weeks   Status On-going   CC Long Term Goal  #4   Title Pt. will be  independent in strength ABC program and knowledgeable about safe exercise practices re: lymphedema risk.   Period Weeks   Status On-going            Plan - 06/05/14 1615    Clinical Impression Statement Patient has visible skin irritation on anterior right elbow and at medial wrist.  This appears to be the typical irritation that occurs with prolonged bandaging and will likely appear better at next visit.  Her arm appears soft and less fibrotic than last visit and pt is pleased with her progress.  She continues to need therapy to further reduce her swelling.   Pt will benefit from skilled therapeutic intervention in order to improve on the following deficits Increased edema;Decreased knowledge of use of DME;Decreased knowledge of precautions   Rehab Potential Excellent   PT Frequency 2x / week   PT Duration 6 weeks   PT Treatment/Interventions Manual lymph drainage;Compression bandaging;Patient/family education   PT Next Visit Plan Continue manual lymph drainage and bandaging.  Remeasure right arm.   Consulted and Agree with Plan of Care Patient        Problem List Patient Active Problem List   Diagnosis Date Noted  . Malignant neoplasm of breast (female), unspecified site 07/20/2013  . Cancer of female breast 06/27/2012    Annia Friendly, PT 06/05/2014, 4:18 PM  [place], [place], [address] Phone: [phone]   Fax:  [phone]

## 2014-06-06 ENCOUNTER — Ambulatory Visit
Admit: 2014-06-06 | Disposition: A | Discharge status: Home / Self Care | Payer: Self-pay | Attending: Hematology and Oncology | Admitting: Hematology and Oncology

## 2014-06-07 ENCOUNTER — Ambulatory Visit: Payer: Managed Care, Other (non HMO) | Attending: Radiation Oncology

## 2014-06-07 DIAGNOSIS — I890 Lymphedema, not elsewhere classified: Secondary | ICD-10-CM | POA: Insufficient documentation

## 2014-06-07 DIAGNOSIS — E8989 Other postprocedural endocrine and metabolic complications and disorders: Secondary | ICD-10-CM | POA: Diagnosis not present

## 2014-06-07 NOTE — Therapy (Signed)
[place], [place], [address] Phone: [phone]   Fax:  [phone]  Physical Therapy Treatment  Patient Details  Name: Natalie Gallagher MRN: [id number] Date of Birth: October 27, 1970 Referring Provider:  Noreene Filbert, MD  Encounter Date: 06/07/2014      PT End of Session - 06/07/14 1542    Visit Number 6   Number of Visits 25   Date for PT Re-Evaluation 07/14/14   PT Start Time 2549   PT Stop Time 1521   PT Time Calculation (min) 47 min      Past Medical History  Diagnosis Date  . Hypertension   . Cancer 2014    right breast.right wide excision with sentinal node biopsy on 06/21/12  . Breast cancer of upper-outer quadrant of right female breast March 2014    T1c,N0,M0; BRCA neg. ER: 60%; PR: 70%, her 2 neu not over expressed.     Past Surgical History  Procedure Laterality Date  . Breast surgery Right 06/21/2012    right wide excision with sentinal node biopsy/axilary dissection.    There were no vitals taken for this visit.  Visit Diagnosis:  Lymphedema of upper extremity following lymphadenectomy      Subjective Assessment - 06/07/14 1443    Symptoms My thumb and finger wrap got caught on thing s and they unraveled. Wrist felt better with Allevyn bandage on.    Currently in Pain? No/denies             LYMPHEDEMA/ONCOLOGY QUESTIONNAIRE - 06/07/14 1444    Right Upper Extremity Lymphedema   15 cm Proximal to Olecranon Process 35.8 cm   10 cm Proximal to Olecranon Process 32.7 cm   Olecranon Process 25.1 cm   15 cm Proximal to Ulnar Styloid Process 26.8 cm   10 cm Proximal to Ulnar Styloid Process 23.7 cm   Just Proximal to Ulnar Styloid Process 15.4 cm   Across Hand at [employer] 17.7 cm   At [place] of 2nd Digit 6.4 cm               [facility] Adult PT Treatment/Exercise - 06/07/14 0001    Manual Therapy   Manual Lymphatic Drainage (MLD) In supine, short neck, superficial  and deep abdomen, left axilla and anterior interaxillary anastomosis, right groin and axillo-inguinal anastomosis, and right UE from fingers to shoulder.   Compression Bandaging Lotion applied.  Applied 3x3" Allevyn pad over wrist abrasion, then donned compression glove,  applied bandaging of right UE with stockinette, peach dotted foam to posterior and anterior forearm, 1/4 inch gray foam posterior upper arm, and anterior elbow, Artiflex x 2, 1-6 cm. bandage at hand and wrist, and then 1-10 and 1-12 cm to UE.  No c/o discomfort.                   Short Term Clinic Goals - 06/07/14 1547    CC Short Term Goal  #1   Title Pt. will show a reduction in circumference of right forearm at 15 cm. proximal to ulnar styloid of at least 1 cm.  1.1 cm reduction attained 06/07/14.   Status Achieved             Long Term Clinic Goals - 05/29/14 1711    CC Long Term Goal  #1   Title Pt. will show a reduction of at least 1.5 cm. in circumference of right forearm at 15 cm. proximal to ulnar styloid.   Time  6   Period Weeks   Status On-going   CC Long Term Goal  #2   Title Pt. will be knowledgeable about options for self-treatment/management of lymphedema, including Flexitouch pump and proper use of nighttime garment and/or bandaging.   Time 6   Period Weeks   Status On-going   CC Long Term Goal  #3   Title Pt. will be independent in correctly performing self-manual lymph drainage.   Time 6   Period Weeks   Status On-going   CC Long Term Goal  #4   Title Pt. will be independent in strength ABC program and knowledgeable about safe exercise practices re: lymphedema risk.   Period Weeks   Status On-going            Plan - 06/07/14 1543    Clinical Impression Statement Skin irritations much improved and pt wanted to be wrapped with compression glove today. Good reductions with circumference measurments.   Pt will benefit from skilled therapeutic intervention in order to improve on  the following deficits Increased edema;Decreased knowledge of use of DME;Decreased knowledge of precautions   Rehab Potential Excellent   PT Frequency 2x / week   PT Duration 6 weeks   PT Treatment/Interventions Manual lymph drainage;Compression bandaging;Patient/family education   PT Next Visit Plan Continue manual lymph drainage and bandaging.     PT Home Exercise Plan Pt wants to try Zumba when arm is bandaged.   Consulted and Agree with Plan of Care Patient        Problem List Patient Active Problem List   Diagnosis Date Noted  . Malignant neoplasm of breast (female), unspecified site 07/20/2013  . Cancer of female breast 06/27/2012    [name] 06/07/2014, 3:49 PM  [place], [place], [address] Phone: [phone]   Fax:  [phone]

## 2014-06-09 ENCOUNTER — Encounter: Payer: Managed Care, Other (non HMO) | Admitting: Physical Therapy

## 2014-06-13 ENCOUNTER — Ambulatory Visit: Payer: Managed Care, Other (non HMO)

## 2014-06-13 DIAGNOSIS — E8989 Other postprocedural endocrine and metabolic complications and disorders: Secondary | ICD-10-CM | POA: Diagnosis not present

## 2014-06-13 DIAGNOSIS — I890 Lymphedema, not elsewhere classified: Principal | ICD-10-CM

## 2014-06-13 NOTE — Therapy (Signed)
[place], [place], [address] Phone: [phone]   Fax:  [phone]  Physical Therapy Treatment  Patient Details  Name: Natalie Gallagher MRN: [id number] Date of Birth: 1971/01/20 Referring Provider:  Noreene Filbert, MD  Encounter Date: 06/13/2014      PT End of Session - 06/13/14 1021    Visit Number 7   Number of Visits 25   Date for PT Re-Evaluation 07/14/14   PT Start Time 0935   PT Stop Time 1015   PT Time Calculation (min) 40 min      Past Medical History  Diagnosis Date  . Hypertension   . Cancer 2014    right breast.right wide excision with sentinal node biopsy on 06/21/12  . Breast cancer of upper-outer quadrant of right female breast March 2014    T1c,N0,M0; BRCA neg. ER: 60%; PR: 70%, her 2 neu not over expressed.     Past Surgical History  Procedure Laterality Date  . Breast surgery Right 06/21/2012    right wide excision with sentinal node biopsy/axilary dissection.    There were no vitals taken for this visit.  Visit Diagnosis:  Lymphedema of upper extremity following lymphadenectomy      Subjective Assessment - 06/13/14 0936    Symptoms Took bandages off yesterday and washed them, rewrapped my arm this morning. No complaints, skin seems to be doing better though still itchy! Exercised over weekend and hand/arm did well.                    [facility] Adult PT Treatment/Exercise - 06/13/14 0001    Manual Therapy   Manual Lymphatic Drainage (MLD) In supine, short neck, superficial and deep abdomen, left axilla and anterior interaxillary anastomosis, right groin and axillo-inguinal anastomosis, and right UE from fingers to shoulder.   Compression Bandaging Lotion applied.  Applied 3x3" Allevyn pad over wrist abrasion, then donned compression glove,  applied bandaging of right UE with stockinette, peach dotted foam to posterior and anterior forearm, 1/4 inch gray foam  posterior forearm, and anterior elbow, Artiflex x 2, 1-6 cm, 1-10 and 1-12 cm from hand to axilla.                    Short Term Clinic Goals - 06/07/14 1547    CC Short Term Goal  #1   Title Pt. will show a reduction in circumference of right forearm at 15 cm. proximal to ulnar styloid of at least 1 cm.  1.1 cm reduction attained 06/07/14.   Status Achieved             Long Term Clinic Goals - 05/29/14 1711    CC Long Term Goal  #1   Title Pt. will show a reduction of at least 1.5 cm. in circumference of right forearm at 15 cm. proximal to ulnar styloid.   Time 6   Period Weeks   Status On-going   CC Long Term Goal  #2   Title Pt. will be knowledgeable about options for self-treatment/management of lymphedema, including Flexitouch pump and proper use of nighttime garment and/or bandaging.   Time 6   Period Weeks   Status On-going   CC Long Term Goal  #3   Title Pt. will be independent in correctly performing self-manual lymph drainage.   Time 6   Period Weeks   Status On-going   CC Long Term Goal  #4   Title Pt. will be independent in  strength ABC program and knowledgeable about safe exercise practices re: lymphedema risk.   Period Weeks   Status On-going            Plan - 06/13/14 1022    Clinical Impression Statement Pts forearm tissue is much improved being softer to touch and smaller in appearance. Pt reports is ready to be done with bandaging and to get into a new sleeve but is willing to continue as long as needed.Skin looked very good today, no redness though did apply another Allevyn bandage to previous healing sores at wrist to prevent further irritation.    Pt will benefit from skilled therapeutic intervention in order to improve on the following deficits Increased edema;Decreased knowledge of use of DME;Decreased knowledge of precautions   Rehab Potential Excellent   PT Frequency 2x / week   PT Duration 6 weeks   PT Treatment/Interventions Manual  lymph drainage;Compression bandaging;Patient/family education   PT Next Visit Plan Continue manual lymph drainage and bandaging. Remeasure next visit and assess goals.   Consulted and Agree with Plan of Care Patient        Problem List Patient Active Problem List   Diagnosis Date Noted  . Malignant neoplasm of breast (female), unspecified site 07/20/2013  . Cancer of female breast 06/27/2012    Otelia Limes, PTA 06/13/2014, 10:25 AM  [place], [place], [address] Phone: [phone]   Fax:  [phone]

## 2014-06-15 ENCOUNTER — Ambulatory Visit: Payer: Managed Care, Other (non HMO)

## 2014-06-15 DIAGNOSIS — I890 Lymphedema, not elsewhere classified: Principal | ICD-10-CM

## 2014-06-15 DIAGNOSIS — E8989 Other postprocedural endocrine and metabolic complications and disorders: Secondary | ICD-10-CM | POA: Diagnosis not present

## 2014-06-15 NOTE — Therapy (Signed)
[place], [place], [address] Phone: [phone]   Fax:  [phone]  Physical Therapy Treatment  Patient Details  Name: Natalie Gallagher MRN: [id number] Date of Birth: 1970-12-20 Referring Provider:  Noreene Filbert, MD  Encounter Date: 06/15/2014      PT End of Session - 06/15/14 1539    Visit Number 8   Number of Visits 25   Date for PT Re-Evaluation 07/14/14   PT Start Time 5462   PT Stop Time 1528   PT Time Calculation (min) 49 min      Past Medical History  Diagnosis Date  . Hypertension   . Cancer 2014    right breast.right wide excision with sentinal node biopsy on 06/21/12  . Breast cancer of upper-outer quadrant of right female breast March 2014    T1c,N0,M0; BRCA neg. ER: 60%; PR: 70%, her 2 neu not over expressed.     Past Surgical History  Procedure Laterality Date  . Breast surgery Right 06/21/2012    right wide excision with sentinal node biopsy/axilary dissection.    There were no vitals filed for this visit.  Visit Diagnosis:  Lymphedema of upper extremity following lymphadenectomy      Subjective Assessment - 06/15/14 1441    Symptoms Doing good, bandage did well except hand bandage unraveled next morning.                LYMPHEDEMA/ONCOLOGY QUESTIONNAIRE - 06/15/14 1442    Right Upper Extremity Lymphedema   15 cm Proximal to Olecranon Process 35.7 cm   10 cm Proximal to Olecranon Process 32.4 cm   Olecranon Process 24.9 cm   15 cm Proximal to Ulnar Styloid Process 27 cm   10 cm Proximal to Ulnar Styloid Process 23.8 cm   Just Proximal to Ulnar Styloid Process 15.4 cm   Across Hand at [employer] 17.7 cm   At [place] of 2nd Digit 6 cm                [facility] Adult PT Treatment/Exercise - 06/15/14 0001    Manual Therapy   Manual Lymphatic Drainage (MLD) In supine, short neck, superficial and deep abdomen, left axilla and anterior interaxillary  anastomosis, right groin and axillo-inguinal anastomosis, and right UE from fingers to shoulder.   Compression Bandaging Lotion applied. Donned compression glove, applied bandaging of right UE with stockinette, peach striped foam to posterior and anterior forearm, 1/4 inch gray foam at anterior elbow, Artiflex x1, 1-6 cm, 1-10 and 1-12 cm from hand to axilla.                    Short Term Clinic Goals - 06/07/14 1547    CC Short Term Goal  #1   Title Pt. will show a reduction in circumference of right forearm at 15 cm. proximal to ulnar styloid of at least 1 cm.  1.1 cm reduction attained 06/07/14.   Status Achieved             Long Term Clinic Goals - 06/15/14 1542    CC Long Term Goal  #1   Title Pt. will show a reduction of at least 1.5 cm. in circumference of right forearm at 15 cm. proximal to ulnar styloid.  0.9 cm reduction 06/15/14   Status On-going   CC Long Term Goal  #2   Title Pt. will be knowledgeable about options for self-treatment/management of lymphedema, including Flexitouch pump and proper use of  nighttime garment and/or bandaging.   Status On-going   CC Long Term Goal  #3   Title Pt. will be independent in correctly performing self-manual lymph drainage.   Status Achieved   CC Long Term Goal  #4   Title Pt. will be independent in strength ABC program and knowledgeable about safe exercise practices re: lymphedema risk.   Status On-going            Plan - 06/15/14 1540    Clinical Impression Statement Minimal to no changes in circumference measurements from last time. Pt agrees ok to have pts information faxed to [facility] to get pt measured for new compression sleeve.    Pt will benefit from skilled therapeutic intervention in order to improve on the following deficits Increased edema;Decreased knowledge of use of DME;Decreased knowledge of precautions   Rehab Potential Excellent   PT Frequency 2x / week   PT Duration 6 weeks   PT  Treatment/Interventions Manual lymph drainage;Compression bandaging;Patient/family education   PT Next Visit Plan Continue manual lymph drainage and bandaging. Faxed deomgraphics sheet to [place].   Consulted and Agree with Plan of Care Patient        Problem List Patient Active Problem List   Diagnosis Date Noted  . Malignant neoplasm of breast (female), unspecified site 07/20/2013  . Cancer of female breast 06/27/2012    Natalie Gallagher, PTA 06/15/2014, 3:44 PM  [place], [place], [address] Phone: [phone]   Fax:  [phone]

## 2014-06-19 ENCOUNTER — Ambulatory Visit: Payer: Managed Care, Other (non HMO) | Admitting: Physical Therapy

## 2014-06-19 DIAGNOSIS — E8989 Other postprocedural endocrine and metabolic complications and disorders: Secondary | ICD-10-CM | POA: Diagnosis not present

## 2014-06-19 DIAGNOSIS — I890 Lymphedema, not elsewhere classified: Principal | ICD-10-CM

## 2014-06-19 NOTE — Therapy (Signed)
[place], [place], [address] Phone: [phone]   Fax:  [phone]  Physical Therapy Treatment  Patient Details  Name: Natalie Gallagher MRN: [id number] Date of Birth: 1970-04-14 Referring Provider:  Noreene Filbert, MD  Encounter Date: 06/19/2014      PT End of Session - 06/19/14 2148    Visit Number 9   Number of Visits 25   Date for PT Re-Evaluation 07/14/14   PT Start Time 1520   PT Stop Time 1558   PT Time Calculation (min) 38 min   Activity Tolerance Patient tolerated treatment well   Behavior During Therapy [facility] for tasks assessed/performed      Past Medical History  Diagnosis Date  . Hypertension   . Cancer 2014    right breast.right wide excision with sentinal node biopsy on 06/21/12  . Breast cancer of upper-outer quadrant of right female breast March 2014    T1c,N0,M0; BRCA neg. ER: 60%; PR: 70%, her 2 neu not over expressed.     Past Surgical History  Procedure Laterality Date  . Breast surgery Right 06/21/2012    right wide excision with sentinal node biopsy/axilary dissection.    There were no vitals filed for this visit.  Visit Diagnosis:  Lymphedema of upper extremity following lymphadenectomy      Subjective Assessment - 06/19/14 1520    Symptoms Upper bandage kept coming down--I wrapped and wrapped but it didn't want to stay.  Started working out with the aerobics again last week.   Currently in Pain? No/denies                       [facility] Adult PT Treatment/Exercise - 06/19/14 0001    Manual Therapy   Manual Lymphatic Drainage (MLD) In supine, short neck, superficial and deep abdomen, left axilla and anterior interaxillary anastomosis, right groin and axillo-inguinal anastomosis, and right UE from fingers to shoulder.   Compression Bandaging Lotion applied to right UE, then bandaging with glove on hand, stockinette wrist to shoulder, 1/4 inch gray foam at  anterior elbow, Artiflex x 1, and 1-10 cm. and 1-12 cm. bandage from wrist to shoulder, the latter applied in herringbone pattern to forearm.  Suggested that a third bandage be applied to upper extremity, but patient declined, saying her hot flashes would prevent her from tolerating this.                PT Education - 06/19/14 2152    Education provided Yes   Education Details Discussed exercise wearing compression bandages and that some arm movement is certainly safe in this situation, but perhaps not continuous arm movement for an hour-long dance class.  Patient reported using just her left arm for the most part, trying to keep her right arm still.   Person(s) Educated Patient   Methods Explanation   Comprehension Verbalized understanding           Short Term Clinic Goals - 06/07/14 1547    CC Short Term Goal  #1   Title Pt. will show a reduction in circumference of right forearm at 15 cm. proximal to ulnar styloid of at least 1 cm.  1.1 cm reduction attained 06/07/14.   Status Achieved             Long Term Clinic Goals - 06/15/14 1542    CC Long Term Goal  #1   Title Pt. will show a reduction of at least 1.5 cm.  in circumference of right forearm at 15 cm. proximal to ulnar styloid.  0.9 cm reduction 06/15/14   Status On-going   CC Long Term Goal  #2   Title Pt. will be knowledgeable about options for self-treatment/management of lymphedema, including Flexitouch pump and proper use of nighttime garment and/or bandaging.   Status On-going   CC Long Term Goal  #3   Title Pt. will be independent in correctly performing self-manual lymph drainage.   Status Achieved   CC Long Term Goal  #4   Title Pt. will be independent in strength ABC program and knowledgeable about safe exercise practices re: lymphedema risk.   Status On-going            Plan - 06/19/14 2149    Clinical Impression Statement Pt. declined an additional bandage for right arm today, saying she  would not tolerate it due to hot flashes.     PT Next Visit Plan Remeasure.  Continue complete decongestive therapy.  Give patient strength ABC program handouts and teach this as time allows.   Consulted and Agree with Plan of Care Patient        Problem List Patient Active Problem List   Diagnosis Date Noted  . Malignant neoplasm of breast (female), unspecified site 07/20/2013  . Cancer of female breast 06/27/2012    [place],DONNA 06/19/2014, 9:55 PM  [place], [place], [address] Phone: [phone]   Fax:  [place], PT 06/19/2014 9:55 PM

## 2014-06-21 ENCOUNTER — Ambulatory Visit: Payer: Managed Care, Other (non HMO)

## 2014-06-21 DIAGNOSIS — I890 Lymphedema, not elsewhere classified: Principal | ICD-10-CM

## 2014-06-21 DIAGNOSIS — E8989 Other postprocedural endocrine and metabolic complications and disorders: Secondary | ICD-10-CM | POA: Diagnosis not present

## 2014-06-21 NOTE — Therapy (Signed)
[place], [place], [address] Phone: [phone]   Fax:  [phone]  Physical Therapy Treatment  Patient Details  Name: Natalie Gallagher MRN: [id number] Date of Birth: [birth year] Referring Provider:  Noreene Filbert, MD  Encounter Date: 06/21/2014      PT End of Session - 06/21/14 1526    Visit Number 10   Number of Visits 25   Date for PT Re-Evaluation 07/14/14   PT Start Time 1430   PT Stop Time 1515   PT Time Calculation (min) 45 min      Past Medical History  Diagnosis Date  . Hypertension   . Cancer 2014    right breast.right wide excision with sentinal node biopsy on 06/21/12  . Breast cancer of upper-outer quadrant of right female breast March 2014    T1c,N0,M0; BRCA neg. ER: 60%; PR: 70%, her 2 neu not over expressed.     Past Surgical History  Procedure Laterality Date  . Breast surgery Right 06/21/2012    right wide excision with sentinal node biopsy/axilary dissection.    There were no vitals filed for this visit.  Visit Diagnosis:  Lymphedema of upper extremity following lymphadenectomy      Subjective Assessment - 06/21/14 1431    Symptoms We left the foam out of my bandage but it was still hot.                LYMPHEDEMA/ONCOLOGY QUESTIONNAIRE - 06/21/14 1436    Right Upper Extremity Lymphedema   15 cm Proximal to Olecranon Process 36 cm   10 cm Proximal to Olecranon Process 31.8 cm   Olecranon Process 24.9 cm   15 cm Proximal to Ulnar Styloid Process 26.7 cm   10 cm Proximal to Ulnar Styloid Process 24 cm   Just Proximal to Ulnar Styloid Process 14.8 cm   Across Hand at [employer] 17.4 cm   At [place] of 2nd Digit 6 cm                [facility] Adult PT Treatment/Exercise - 06/21/14 0001    Manual Therapy   Edema Management Skin check showed blister at base of thumb and beginning of one at medial base of 5th digit and webspace of fingers looked slightly  macerated. Pt reports has been washing her hand with glove on as it seems to dry quickly. Instructed pt to not continue this and she agreed.   Manual Lymphatic Drainage (MLD) In supine, short neck, superficial and deep abdomen, left axilla and anterior interaxillary anastomosis, right groin and axillo-inguinal anastomosis, and right UE from fingers to shoulder.   Compression Bandaging Biotone lotion applied to Rt UE, stockinette, Elastomull to 3 middle fingers fixated with paper tape, Artiflex x1 with 1/4" gray foam doubled over at anterior elbow, and 3 short stretch compression bandages from hand to axilla.                   Short Term Clinic Goals - 06/07/14 1547    CC Short Term Goal  #1   Title Pt. will show a reduction in circumference of right forearm at 15 cm. proximal to ulnar styloid of at least 1 cm.  1.1 cm reduction attained 06/07/14.   Status Achieved             Long Term Clinic Goals - 06/21/14 1530    CC Long Term Goal  #1   Title Pt. will show a reduction of  at least 1.5 cm. in circumference of right forearm at 15 cm. proximal to ulnar styloid.  1.2 cm reduction attained 06/21/14   Status On-going   CC Long Term Goal  #2   Title Pt. will be knowledgeable about options for self-treatment/management of lymphedema, including Flexitouch pump and proper use of nighttime garment and/or bandaging.   Status On-going   CC Long Term Goal  #4   Title Pt. will be independent in strength ABC program and knowledgeable about safe exercise practices re: lymphedema risk.   Status On-going            Plan - 06/21/14 1526    Clinical Impression Statement Pt agreeable to wrapping hand with Elastomull today as webspace between her fingers was macerated and irritated (see note) from her washing her hand with glove on repetively. Pt had attained good reductions at most of her measurements today.    Pt will benefit from skilled therapeutic intervention in order to improve on  the following deficits Increased edema;Decreased knowledge of use of DME;Decreased knowledge of precautions   Rehab Potential Excellent   PT Frequency 2x / week   PT Duration 6 weeks   PT Treatment/Interventions Manual lymph drainage;Compression bandaging;Patient/family education   PT Next Visit Plan Continue complete decongestive therapy.  Give patient strength ABC program handouts and teach this as time allows.   Consulted and Agree with Plan of Care Patient        Problem List Patient Active Problem List   Diagnosis Date Noted  . Malignant neoplasm of breast (female), unspecified site 07/20/2013  . Cancer of female breast 06/27/2012    Otelia Limes, PTA 06/21/2014, 3:32 PM  [place], [place], [address] Phone: [phone]   Fax:  [phone]

## 2014-06-26 ENCOUNTER — Ambulatory Visit: Payer: Managed Care, Other (non HMO) | Admitting: Physical Therapy

## 2014-06-26 DIAGNOSIS — I890 Lymphedema, not elsewhere classified: Principal | ICD-10-CM

## 2014-06-26 DIAGNOSIS — E8989 Other postprocedural endocrine and metabolic complications and disorders: Secondary | ICD-10-CM

## 2014-06-26 NOTE — Therapy (Signed)
[place], [place], [address] Phone: [phone]   Fax:  [phone]  Physical Therapy Treatment  Patient Details  Name: Natalie Gallagher MRN: [id number] Date of Birth: 1970-12-21 Referring Provider:  Noreene Filbert, MD  Encounter Date: 06/26/2014      PT End of Session - 06/26/14 1715    Visit Number 11   Number of Visits 25   Date for PT Re-Evaluation 07/14/14   PT Start Time 1518   PT Stop Time 1604   PT Time Calculation (min) 46 min   Activity Tolerance Patient tolerated treatment well   Behavior During Therapy [facility] for tasks assessed/performed      Past Medical History  Diagnosis Date  . Hypertension   . Cancer 2014    right breast.right wide excision with sentinal node biopsy on 06/21/12  . Breast cancer of upper-outer quadrant of right female breast March 2014    T1c,N0,M0; BRCA neg. ER: 60%; PR: 70%, her 2 neu not over expressed.     Past Surgical History  Procedure Laterality Date  . Breast surgery Right 06/21/2012    right wide excision with sentinal node biopsy/axilary dissection.    There were no vitals filed for this visit.  Visit Diagnosis:  Lymphedema of upper extremity following lymphadenectomy      Subjective Assessment - 06/26/14 1522    Symptoms Elastomull came undone on hand so put glove back on and tucked it in arm bandages (on Friday).   Currently in Pain? No/denies                       [facility] Adult PT Treatment/Exercise - 06/26/14 0001    Manual Therapy   Manual Lymphatic Drainage (MLD) In supine, short neck, superficial and deep abdomen, left axilla and anterior interaxillary anastomosis, right groin and axillo-inguinal anastomosis, and right UE from fingers to shoulder.   Compression Bandaging Lotion applied to right UE.  Pt. donned compression glove, then bandaging with stockinette, Artiflex x 1 with gray foam at anterior elbow, and 2 short stretch  bandages ( new 12-cm. bandage issued) to right arm from wrist to shoulder.                PT Education - 06/26/14 1714    Education provided Yes   Education Details Began instructing patient in strength ABC program with verbal instruction and handout.   Person(s) Educated Patient   Methods Explanation;Handout   Comprehension Need further instruction           Short Term Clinic Goals - 06/07/14 1547    CC Short Term Goal  #1   Title Pt. will show a reduction in circumference of right forearm at 15 cm. proximal to ulnar styloid of at least 1 cm.  1.1 cm reduction attained 06/07/14.   Status Achieved             Long Term Clinic Goals - 06/21/14 1530    CC Long Term Goal  #1   Title Pt. will show a reduction of at least 1.5 cm. in circumference of right forearm at 15 cm. proximal to ulnar styloid.  1.2 cm reduction attained 06/21/14   Status On-going   CC Long Term Goal  #2   Title Pt. will be knowledgeable about options for self-treatment/management of lymphedema, including Flexitouch pump and proper use of nighttime garment and/or bandaging.   Status On-going   CC Long Term Goal  #4  Title Pt. will be independent in strength ABC program and knowledgeable about safe exercise practices re: lymphedema risk.   Status On-going            Plan - 06/26/14 1715    Clinical Impression Statement Skin on hand showed no maceration or wounds today.  Did well with bandaging as before.   Pt will benefit from skilled therapeutic intervention in order to improve on the following deficits Increased edema;Decreased knowledge of use of DME;Decreased knowledge of precautions   Rehab Potential Excellent   PT Treatment/Interventions Manual lymph drainage;Compression bandaging;Patient/family education   PT Next Visit Plan Have patient perform strength ABC program as time allows; continue complete decongestive therapy.   Consulted and Agree with Plan of Care Patient         Problem List Patient Active Problem List   Diagnosis Date Noted  . Malignant neoplasm of breast (female), unspecified site 07/20/2013  . Cancer of female breast 06/27/2012    SALISBURY,DONNA 06/26/2014, 5:18 PM  [place], [place], [address] Phone: [phone]   Fax:  [place], PT 06/26/2014 5:18 PM

## 2014-06-28 ENCOUNTER — Ambulatory Visit: Payer: Managed Care, Other (non HMO) | Admitting: Physical Therapy

## 2014-06-28 DIAGNOSIS — I890 Lymphedema, not elsewhere classified: Principal | ICD-10-CM

## 2014-06-28 DIAGNOSIS — E8989 Other postprocedural endocrine and metabolic complications and disorders: Secondary | ICD-10-CM

## 2014-06-28 NOTE — Therapy (Signed)
[place], [place], [address] Phone: [phone]   Fax:  [phone]  Physical Therapy Treatment  Patient Details  Name: Natalie Gallagher MRN: [id number] Date of Birth: Dec 13, 1970 Referring Provider:  Noreene Filbert, MD  Encounter Date: 06/28/2014      PT End of Session - 06/28/14 1714    Visit Number 12   Number of Visits 25   Date for PT Re-Evaluation 07/14/14   PT Start Time 7494   PT Stop Time 1601   PT Time Calculation (min) 44 min   Activity Tolerance Patient tolerated treatment well   Behavior During Therapy [facility] for tasks assessed/performed      Past Medical History  Diagnosis Date  . Hypertension   . Cancer 2014    right breast.right wide excision with sentinal node biopsy on 06/21/12  . Breast cancer of upper-outer quadrant of right female breast March 2014    T1c,N0,M0; BRCA neg. ER: 60%; PR: 70%, her 2 neu not over expressed.     Past Surgical History  Procedure Laterality Date  . Breast surgery Right 06/21/2012    right wide excision with sentinal node biopsy/axilary dissection.    There were no vitals filed for this visit.  Visit Diagnosis:  Lymphedema of upper extremity following lymphadenectomy      Subjective Assessment - 06/28/14 1522    Symptoms Can get measured tomorrow for a sleeve where I got the last one.   Currently in Pain? No/denies               LYMPHEDEMA/ONCOLOGY QUESTIONNAIRE - 06/28/14 1522    Right Upper Extremity Lymphedema   15 cm Proximal to Olecranon Process 35.2 cm   10 cm Proximal to Olecranon Process 33.1 cm   Olecranon Process 24.8 cm   15 cm Proximal to Ulnar Styloid Process 27 cm   10 cm Proximal to Ulnar Styloid Process 24.5 cm   Just Proximal to Ulnar Styloid Process 15.3 cm   Across Hand at [employer] 17.4 cm   At [place] of 2nd Digit 5.9 cm                [facility] Adult PT Treatment/Exercise - 06/28/14 0001    Manual  Therapy   Edema Management circumference measurements taken   Manual Lymphatic Drainage (MLD) In supine, short neck, superficial and deep abdomen, left axilla and anterior interaxillary anastomosis, right groin and axillo-inguinal anastomosis, and right UE from fingers to shoulder.   Compression Bandaging Lotion applied to right UE.  Pt. donned compression glove, then bandaging with stockinette, Artiflex x 1 with gray foam at anterior elbow, and 2 short stretch bandages ( new 12-cm. bandage issued) to right arm from wrist to shoulder.                   Short Term Clinic Goals - 06/07/14 1547    CC Short Term Goal  #1   Title Pt. will show a reduction in circumference of right forearm at 15 cm. proximal to ulnar styloid of at least 1 cm.  1.1 cm reduction attained 06/07/14.   Status Achieved             Long Term Clinic Goals - 06/21/14 1530    CC Long Term Goal  #1   Title Pt. will show a reduction of at least 1.5 cm. in circumference of right forearm at 15 cm. proximal to ulnar styloid.  1.2 cm reduction attained 06/21/14  Status On-going   CC Long Term Goal  #2   Title Pt. will be knowledgeable about options for self-treatment/management of lymphedema, including Flexitouch pump and proper use of nighttime garment and/or bandaging.   Status On-going   CC Long Term Goal  #4   Title Pt. will be independent in strength ABC program and knowledgeable about safe exercise practices re: lymphedema risk.   Status On-going            Plan - 06/28/14 1715    Clinical Impression Statement Patient's circumference measurements have improved overall since initial eval, but over the last few measurements have shown slight ups and downs.  Pt. is ready to measured for new compression sleeve and glove.  Pt. feels she can perform strength ABC program based on verbal instruction given last session.   Pt will benefit from skilled therapeutic intervention in order to improve on the  following deficits Increased edema;Decreased knowledge of use of DME;Decreased knowledge of precautions   Rehab Potential Excellent   PT Frequency 2x / week   PT Duration 6 weeks   PT Treatment/Interventions Manual lymph drainage;Compression bandaging;Patient/family education   PT Next Visit Plan continue complete decongestive therapy   Recommended Other Services Pt. will go tomorrow to orthotist/prosthetist she has used before for garments and be measured; will also be measured by Rexford Maus later.   Consulted and Agree with Plan of Care Patient        Problem List Patient Active Problem List   Diagnosis Date Noted  . Malignant neoplasm of breast (female), unspecified site 07/20/2013  . Cancer of female breast 06/27/2012    SALISBURY,DONNA 06/28/2014, 5:19 PM  [place], [place], [address] Phone: [phone]   Fax:  [place], PT 06/28/2014 5:19 PM

## 2014-07-03 ENCOUNTER — Ambulatory Visit: Payer: Managed Care, Other (non HMO) | Admitting: Physical Therapy

## 2014-07-03 DIAGNOSIS — E8989 Other postprocedural endocrine and metabolic complications and disorders: Secondary | ICD-10-CM

## 2014-07-03 DIAGNOSIS — I890 Lymphedema, not elsewhere classified: Principal | ICD-10-CM

## 2014-07-03 NOTE — Therapy (Signed)
[place], [place], [address] Phone: [phone]   Fax:  [phone]  Physical Therapy Treatment  Patient Details  Name: Alette Kataoka MRN: [id number] Date of Birth: 06/08/1970 Referring Provider:  Noreene Filbert, MD  Encounter Date: 07/03/2014    Past Medical History  Diagnosis Date  . Hypertension   . Cancer 2014    right breast.right wide excision with sentinal node biopsy on 06/21/12  . Breast cancer of upper-outer quadrant of right female breast March 2014    T1c,N0,M0; BRCA neg. ER: 60%; PR: 70%, her 2 neu not over expressed.     Past Surgical History  Procedure Laterality Date  . Breast surgery Right 06/21/2012    right wide excision with sentinal node biopsy/axilary dissection.    There were no vitals filed for this visit.  Visit Diagnosis:  Lymphedema of upper extremity following lymphadenectomy      Subjective Assessment - 07/03/14 1102    Symptoms "i can gell where its gone down." pt hopes to get meausred by Hoyle Sauer this week for another flat knit  daytime sleeve. and glove She wants to talk about if she needs another nighttime garment    Pertinent History Diagnosed in February 2014 with right breast cancer; had lumpectomy in March with 13 lymph nodes removed (all negative), then chemo May-October, and then November to January 2015, radiation.  Pt. went to [facility] for lymphedema therapy in June 2015; got a compression sleeve and was taught how to do manual lymph drainage, then was followed intermittently since then.   Currently in Pain? No/denies                       [facility] Adult PT Treatment/Exercise - 07/03/14 0001    Neck Exercises: Seated   Other Seated Exercise neck and scapular range of motion   Manual Therapy   Manual Lymphatic Drainage (MLD) In supine, short neck, superficial and deep abdomen, left axilla and anterior interaxillary anastomosis, right groin and  axillo-inguinal anastomosis, and right UE from fingers to shoulder.   Compression Bandaging Lotion applied to right UE.  Pt. donned compression glove, then bandaging with stockinette, Artiflex x 1 with gray foam at anterior elbow, and 2 short stretch bandages  to right arm from wrist to shoulder.                   Short Term Clinic Goals - 07/03/14 1105    CC Short Term Goal  #1   Title Pt. will show a reduction in circumference of right forearm at 15 cm. proximal to ulnar styloid of at least 1 cm.   Status Achieved             Long Term Clinic Goals - 07/03/14 1105    CC Long Term Goal  #1   Title Pt. will show a reduction of at least 1.5 cm. in circumference of right forearm at 15 cm. proximal to ulnar styloid.   Status On-going   CC Long Term Goal  #2   Title Pt. will be knowledgeable about options for self-treatment/management of lymphedema, including Flexitouch pump and proper use of nighttime garment and/or bandaging.   Status On-going   CC Long Term Goal  #3   Title Pt. will be independent in correctly performing self-manual lymph drainage.   Status Achieved   CC Long Term Goal  #4   Title Pt. will be independent in strength ABC program and knowledgeable  about safe exercise practices re: lymphedema risk.   Status Achieved            Plan - 07/03/14 1143    Clinical Impression Statement pt feels that her arm has come down.  She is not interested in pursuing an intermittnet pump at this time because she thinks the cost will be too great, especially if she needs to get at new nighttime garment. She feels goal for instruction in strength ABC program has been met.   PT Next Visit Plan continue complete decongestive therapy        Problem List Patient Active Problem List   Diagnosis Date Noted  . Malignant neoplasm of breast (female), unspecified site 07/20/2013  . Cancer of female breast 06/27/2012   Donato Heinz. Owens Shark, PT  07/03/2014, 11:47 AM  [place], [place], [address] Phone: [phone]   Fax:  [phone]

## 2014-07-05 ENCOUNTER — Ambulatory Visit: Payer: Managed Care, Other (non HMO)

## 2014-07-05 DIAGNOSIS — I890 Lymphedema, not elsewhere classified: Principal | ICD-10-CM

## 2014-07-05 DIAGNOSIS — E8989 Other postprocedural endocrine and metabolic complications and disorders: Secondary | ICD-10-CM | POA: Diagnosis not present

## 2014-07-05 NOTE — Therapy (Addendum)
[place], [place], [address] Phone: [phone]   Fax:  [phone]  Physical Therapy Treatment  Patient Details  Name: Natalie Gallagher MRN: [id number] Date of Birth: 1970/08/14 Referring Provider:  Noreene Filbert, MD  Encounter Date: 07/05/2014      PT End of Session - 07/05/14 1154    Visit Number 14   Number of Visits 25   Date for PT Re-Evaluation 07/14/14   PT Start Time 1105   PT Stop Time 1146   PT Time Calculation (min) 41 min      Past Medical History  Diagnosis Date  . Hypertension   . Cancer 2014    right breast.right wide excision with sentinal node biopsy on 06/21/12  . Breast cancer of upper-outer quadrant of right female breast March 2014    T1c,N0,M0; BRCA neg. ER: 60%; PR: 70%, her 2 neu not over expressed.     Past Surgical History  Procedure Laterality Date  . Breast surgery Right 06/21/2012    right wide excision with sentinal node biopsy/axilary dissection.    There were no vitals filed for this visit.  Visit Diagnosis:  Lymphedema of upper extremity following lymphadenectomy      Subjective Assessment - 07/05/14 1115    Symptoms Glad I got measured today with Natalie Gallagher! Also got measured last week where I got my last sleeve and they are going to send the old one back and I only had to pay a portion of the cost.    Currently in Pain? No/denies               LYMPHEDEMA/ONCOLOGY QUESTIONNAIRE - 07/05/14 1119    Right Upper Extremity Lymphedema   15 cm Proximal to Olecranon Process 35.9 cm   10 cm Proximal to Olecranon Process 32.1 cm   Olecranon Process 24.8 cm   15 cm Proximal to Ulnar Styloid Process 26.6 cm   10 cm Proximal to Ulnar Styloid Process 24.2 cm   Just Proximal to Ulnar Styloid Process 16.2 cm   Across Hand at [employer] 17.7 cm   At [place] of 2nd Digit 5.9 cm                [facility] Adult PT Treatment/Exercise - 07/05/14 0001    Manual Therapy   Manual Lymphatic Drainage (MLD) In supine, short neck, left axilla and anterior interaxillary anastomosis, right groin and axillo-inguinal anastomosis, and right UE from fingers to shoulder.   Compression Bandaging Lotion applied to right UE. Compression glove, then bandaging with stockinette, Artiflex x 1 with gray foam at anterior elbow, and 2 short stretch bandages  to right arm from wrist to shoulder.                   Short Term Clinic Goals - 07/03/14 1105    CC Short Term Goal  #1   Title Pt. will show a reduction in circumference of right forearm at 15 cm. proximal to ulnar styloid of at least 1 cm.   Status Achieved             Long Term Clinic Goals - 07/05/14 1200    CC Long Term Goal  #1   Title Pt. will show a reduction of at least 1.5 cm. in circumference of right forearm at 15 cm. proximal to ulnar styloid.  0.6 cm reduction 07/05/14   Status On-going   CC Long Term Goal  #2   Title Pt.  will be knowledgeable about options for self-treatment/management of lymphedema, including Flexitouch pump and proper use of nighttime garment and/or bandaging.   Status Achieved            Plan - 07/05/14 1154    Clinical Impression Statement Pt was measured today for compression sleeve and glove and nighttime garment before treatment by Natalie Gallagher at our clinic. Pt is doing well overall with treatment and is educated about and has good understanding of Maintenance Phase of treatment. She will ready for discharge once she receives her sleeve/glove.    Pt will benefit from skilled therapeutic intervention in order to improve on the following deficits Increased edema;Decreased knowledge of use of DME;Decreased knowledge of precautions   Rehab Potential Excellent   PT Frequency 2x / week   PT Duration 6 weeks   PT Treatment/Interventions Manual lymph drainage;Compression bandaging;Patient/family education   PT Next Visit Plan Continue complete  decongestive therapy until garments arrive.    Consulted and Agree with Plan of Care Patient        Problem List Patient Active Problem List   Diagnosis Date Noted  . Malignant neoplasm of breast (female), unspecified site 07/20/2013  . Cancer of female breast 06/27/2012    Natalie Gallagher, PTA 07/05/2014, 12:03 PM  [place], [place], [address] Phone: [phone]   Fax:  [phone]     PHYSICAL THERAPY DISCHARGE SUMMARY  Visits from Start of Care: 14  Current functional level related to goals / functional outcomes: As noted above, goals partially met.   Remaining deficits: Swelling will continue to require management.   Education / Equipment: Self-care Plan: Patient agrees to discharge.  Patient goals were partially met. Patient is being discharged due to not returning since the last visit.  ?????   She was to continue until she received compression garments; she may have received this without our knowledge to be able to discharge her.  Natalie Gallagher, PT 04/23/2015 1:28 PM

## 2014-07-10 ENCOUNTER — Ambulatory Visit
Admit: 2014-07-10 | Disposition: A | Discharge status: Home / Self Care | Payer: Self-pay | Attending: General Surgery | Admitting: General Surgery

## 2014-07-10 ENCOUNTER — Encounter: Payer: Self-pay | Admitting: General Surgery

## 2014-07-10 ENCOUNTER — Encounter: Payer: Managed Care, Other (non HMO) | Admitting: Physical Therapy

## 2014-07-12 ENCOUNTER — Encounter: Payer: Self-pay | Admitting: General Surgery

## 2014-07-17 ENCOUNTER — Ambulatory Visit: Payer: Self-pay | Admitting: General Surgery

## 2014-07-28 NOTE — Op Note (Signed)
PATIENT NAME:  Natalie Gallagher, Natalie Gallagher MR#:  [id number] DATE OF BIRTH:  15-Mar-1971  DATE OF PROCEDURE:  07/30/2012  PREOPERATIVE DIAGNOSIS: Right breast cancer.   POSTOPERATIVE DIAGNOSIS: Right breast cancer.   OPERATIVE PROCEDURE: Placement of left subclavian PowerPort.   OPERATING SURGEON: Robert Bellow, MD  ANESTHESIA: General by LMA under Dr. Benjamine Mola, Xylocaine 1% plain 10 mL local infiltration.   ESTIMATED BLOOD LOSS: Less than 10 mL.   CLINICAL NOTE: This is a 44 year old woman recently treated for a T2 N0 carcinoma of the right breast. She has shown to have high intermediate risk for recurrent disease and has elected to undergo adjuvant chemotherapy. Central venous access was requested.   OPERATIVE NOTE: With the patient under adequate general anesthesia (per patient request), the left chest and neck were prepped with ChloraPrep and draped. Ultrasound was used to confirm location of the subclavian vein. This was cannulated once, but there was poor advancement of the guidewire and was cannulated a second time. Under fluoroscopy, the guidewire which had passed into the right internal jugular was manipulated into the superior vena cava. The tract was dilated and the catheter advanced into the area of the junction of the right atrium and superior vena cava. It was then tunneled to a pocket on the left anterior abdominal wall. It was anchored to the deep tissue with 3-point fixation with 3-0 Prolene. The catheter initially did not aspirate well, although it irrigated well. Manipulation of the entrance site freed a small kink in this area, and at the end of the procedure, it easily irrigated and aspirated. The catheter was flushed with 10 mL of injectable saline. The wound was closed in layers with 3-0 Vicryl to the adipose tissue and a running 4-0 Vicryl subcuticular suture for the skin. Benzoin, Steri-Strips, Telfa and Tegaderm dressing was then applied.   The patient tolerated the procedure well and  was taken to the recovery room in stable condition.   ____________________________ Robert Bellow, MD jwb:OSi D: 07/30/2012 17:05:32 ET T: 07/31/2012 06:35:36 ET JOB#: [id number]  cc: Robert Bellow, MD, <Dictator> Lavera Guise, MD Rae Halsted Kallie Edward, MD Patsie Mccardle Amedeo Kinsman MD ELECTRONICALLY SIGNED 08/09/2012 10:27

## 2014-07-28 NOTE — Consult Note (Signed)
Reason for Visit: This 44 year old Female patient presents to the clinic for initial evaluation of  breast cancer .   Referred by Dr. Hervey Ard.  Diagnosis:  Chief Complaint/Diagnosis   44 year old female with stage IA (T1 C. N0 M0) invasive mammary carcinoma the right breast as was wide local excision and axillary lymph node dissection ER/PR positive HER-2/neu not overexpressed with Oncotype DX pending  Pathology Report pathology report reviewed   Imaging Report mammograms reviewed   Referral Report clinical notes reviewed   HPI   patient is a 44 year old female who noted a mass in the lower quadrant of her right breast.back in January of 2013 was noted to be some asymmetry on her mammogram. Followup mammograms revealed a 1.7 hypoechoic irregular density in the right breast in the medial portion. She was seen by Dr. Hervey Ard who performed core biopsy positive for invasive mammary carcinoma. She underwent wide local excision with sentinel node biopsy. Unfortunately sentinel node cannot be located so she had axillary node dissection. Tumor was 1.7 cm invasive grade 3 ductal carcinoma. 13 lymph nodes removed all negative for metastatic disease. Tumor was strongly ER/PR positive HER-2/neu not overexpressed.her margins were clear but close at 2 mm. She has done well postoperatively. She's been seen by medical oncology who has recommended Oncotype DX to guide in decision about systemic chemotherapy. She is now referred to radiation oncology for opinion. She did have a mammoplasty performed as part of her wide local excision making placement of a MammoSite catheter balloon contraindicated.she also had a BRCA test which was negative  Past Hx:    Hypertension:    Breast Cancer: right   Eye surgery-lazy eye: age 39   Right breast biopsy: 2014  Past, Family and Social History:  Past Medical History positive   Cardiovascular hypertension   Past Surgical History urgery for lazy  eyeI   Family History noncontributory   Social History noncontributory   Additional Past Medical and Surgical History accompanied by her aunt today   Allergies:   No Known Allergies:   Home Meds:  Home Medications: Medication Instructions Status  hydrochlorothiazide-triamterene 25 mg-37.5 mg oral tablet 1 tab(s) orally once a day Active   Review of Systems:  General negative   Performance Status (ECOG) 0   Skin negative   Breast see HPI   Ophthalmologic negative   ENMT negative   Respiratory and Thorax negative   Cardiovascular negative   Gastrointestinal negative   Genitourinary negative   Musculoskeletal negative   Neurological negative   Psychiatric negative   Hematology/Lymphatics negative   Endocrine negative   Allergic/Immunologic negative   Review of Systems   according to nurse's notesPatient denies any weight loss, fatigue, weakness, fever, chills or night sweats. Patient denies any loss of vision, blurred vision. Patient denies any ringing  of the ears or hearing loss. No irregular heartbeat. Patient denies heart murmur or history of fainting. Patient denies any chest pain or pain radiating to her upper extremities. Patient denies any shortness of breath, difficulty breathing at night, cough or hemoptysis. Patient denies any swelling in the lower legs. Patient denies any nausea vomiting, vomiting of blood, or coffee ground material in the vomitus. Patient denies any stomach pain. Patient states has had normal bowel movements no significant constipation or diarrhea. Patient denies any dysuria, hematuria or significant nocturia. Patient denies any problems walking, swelling in the joints or loss of balance. Patient denies any skin changes, loss of hair or loss of weight.  Patient denies any excessive worrying or anxiety or significant depression. Patient denies any problems with insomnia. Patient denies excessive thirst, polyuria, polydipsia. Patient denies  any swollen glands, patient denies easy bruising or easy bleeding. Patient denies any recent infections, allergies or URI. Patient "s visual fields have not changed significantly in recent time.  Nursing Notes:  Nursing Vital Signs and Chemo Nursing Nursing Notes: *CC Vital Signs Flowsheet:   08-Apr-14 13:50  Temp Temperature 98.6  Pulse Pulse 91  Respirations Respirations 18  SBP SBP 103  DBP DBP 72  Pain Scale (0-10)  0  Current Weight (kg) (kg) 76.1  Height (cm) centimeters 156  BSA (m2) 1.7   Physical Exam:  General/Skin/HEENT:  General normal   Skin normal   Eyes normal   ENMT normal   Head and Neck normal   Additional PE well-developed large rested who female in NAD. HEENT breast exam is within normal limits. She has a recent wide local excision medial portion of the right breast which is well-healed. No dominant mass or nodularity is noted in either breast into position examined. She is also status post right axillary lymph node dissection with incision healed well. No axillary or supraclavicular adenopathy is noted bilaterally. Lungs are clear to A&P cardiac examination shows regular rate and rhythm.   Breasts/Resp/CV/GI/GU:  Respiratory and Thorax normal   Cardiovascular normal   Gastrointestinal normal   Genitourinary normal   MS/Neuro/Psych/Lymph:  Musculoskeletal normal   Neurological normal   Lymphatics normal   Other Results:  Radiology Results: [place]:    11-Feb-14 12:38, US Breast Right  US Breast Right   REASON FOR EXAM:    RT BR LUMP  COMMENTS:       PROCEDURE: [place]  - US BREAST RIGHT  - May 18 2012 12:38PM     RESULT: History: Right breast lump.    Comparison Study: Multiple prior studies studies dating to 10/27/2008.     Findings: There is a new density in the medial portion of the right   breast. This is inferior medial and does not compress out on compression   spot films.Marland Kitchen Ultrasound was performed and reveals a 1.7 cm irregular    hypoechoic lesion. This could represent a small malignancy and surgical   evaluation is suggested.  IMPRESSION:  Parenchymal density in the inferior medial portion of the   right breast which is solid by ultrasound. This measures 1.7 cm. Surgical   consultation is suggested as this could represent malignancy ,biopsy   should be considered.    BI-RADS: Category 4 - Suspicious Abnormality - Biopsy Should Be Considered      Thank you for the oppurtunity to contribute to the care of your patient. Marland Kitchen    BREAST COMPOSITION: The breast composition is HETEROGENEOUSLY DENSE   (glandular tissue is 51-75%) This may decrease the sensitivity of   mammography.      Verified By: Osa Craver, M.D., MD  LabUnknown:    11-Feb-14 11:56, Digital Diagnostic Mammogram Bilateral  PACS Image     11-Feb-14 12:38, US Breast Right  PACS [place]:    11-Feb-14 11:56, Digital Diagnostic Mammogram Bilateral  Digital Diagnostic Mammogram Bilateral   REASON FOR EXAM:    RT BR LUMP  COMMENTS:       PROCEDURE: MAM - MAM DGTL DIAGNOSTIC MAMMO W/CAD  - May 18 2012 11:56AM     RESULT:  A history: Right breast lump.    Findings: Standard mammography and  obtained with compression spot films   of the right breast. Ultrasound obtained. There is a 1.7 cm hypoechoic   irregular lesion in the inferior medial portion right breast is a new   finding from multiple prior mammograms. This density persists on   compression spot films and again is solid by ultrasound. Surgical   evaluation suggested to evaluate with biopsy.    IMPRESSION:  BI-RADS: Category 4 - Suspicious Abnormality - Biopsy Should     Be Considered      Thank you for the oppurtunity to contribute to the care of your patient. Marland Kitchen      BREASTCOMPOSITION: The breast composition is HETEROGENEOUSLY DENSE   (glandular tissue is 51-75%) This may decrease the sensitivity of   mammography.        Verified By: Osa Craver,  M.D., MD   Relevent Results:   Relevant Scans and Labs ultrasound and mammograms reviewed.   Assessment and Plan: Impression:   pathologic stage IA invasive mammary carcinoma the right breast ER/PR positive HER-2/neu not overexpressed status post wide local excision and axillary lymph node dissection in 44 year old female with Oncotype DX pending Plan:   at this time I have recommended whole breast radiation up to 5000 cGy over 5 weeks. Would also boost or scar another 1600 cGy based on the close to margin. Will wait Oncotype DX before going ahead with simulation and have set her up for CT simulation in about 2 weeks' time making sure we review her Oncotype DX and recommendations prior to simulation. Should she need systemic chemotherapy will delay radiation until after that is complete. Risks and benefits of treatment including skin reaction and significant skin reaction based on the large breast size, fatigue, alteration of blood counts, and thickening of the breast were all described in detail to the patient and her aunt. They both seem to comprehend my treatment plan well. R I discussed the case personally with Dr.Ramiah.  I would like to take this opportunity to thank you for allowing me to continue to participate in this patient's care.  CC Referral:  cc: Dr. Clayborn Bigness, Dr. Hervey Ard   Electronic Signatures: Baruch Gouty, Roda Shutters (MD)  (Signed 08-Apr-14 15:43)  Authored: HPI, Diagnosis, Past Hx, PFSH, Allergies, Home Meds, ROS, Nursing Notes, Physical Exam, Other Results, Relevent Results, Encounter Assessment and Plan, CC Referring Physician   Last Updated: 08-Apr-14 15:43 by Armstead Peaks (MD)

## 2014-07-28 NOTE — Op Note (Signed)
PATIENT NAME:  Natalie Gallagher, BOCHICCHIO MR#:  [id number] DATE OF BIRTH:  February 05, 1971  DATE OF PROCEDURE:  06/21/2012  PREOPERATIVE DIAGNOSIS: Invasive mammary carcinoma of the right breast.   POSTOPERATIVE DIAGNOSIS:   Invasive mammary carcinoma of the right breast.  OPERATIVE PROCEDURE: Wide local excision, mastoplasty, attempted sentinel node biopsy, axillary dissection.   SURGEON: Donnalee Curry, MD   ANESTHESIA: General by LMA under Dr. Ronalee Belts, Marcaine 0.5% with 1:200,000 units epinephrine 30 mL local infiltration.   ESTIMATED BLOOD LOSS: Less than 20 mL.   CLINICAL NOTE: This 44 year old woman noted a mass in the lower inner quadrant of the right breast. Core biopsy showed invasive mammary carcinoma. Due to its very medial location over the costochondral cartilages, she underwent peritumoral as well as retroareolar injection with technetium sulfur colloid and SPECT imaging to determine if an internal mammary sentinel node was present. No distinct areas of increased uptake were noted either in the anterior internal mammary chain or the axilla.   PROCEDURE IN DETAIL:  The patient was brought to the operating room and underwent general anesthesia. 4 mL of methylene blue diluted 1:2 with normal saline was injected in the subareolar plexus. The breast, axilla and neck was prepped with ChloraPrep and draped. Attention was turned to the axilla. There was low counts of 40 to 50. A transverse incision was made at the base of the axillary hairline. Dissection was undertaken but a sentinel node could not be identified. Attention was then turned towards the breast. The area of palpable thickening was confirmed and marked by ultrasound. An elliptical incision of skin and tissue extending down to and including the deep fascia was undertaken. The specimen was orientated, specimen radiograph completed and the gross examination reported no margin closer than 5 mm.  The breast was elevated off the underlying pectoralis  fascia and mobilized to allow apposition. This was completed in multiple layers with interrupted 2-0 Vicryl figure-of-eight sutures. The skin was then closed with a running 3-0 Vicryl subcuticular suture.   While the gross examination was being completed on the wide excision site, attention was turned to the axilla. As a sentinel node cannot be identified, it was elected to complete an axillary dissection. This was done making use of the harmonic scalpel. The borders of dissection were the axillary vein superiorly, the chest wall medially and latissimus muscle inferiorly. The long thoracic nerve of Bell and thoracodorsal nerve, artery and vein bundles were identified and protected. Both nerves were functional at the end of the procedure. The axillary contents were swept inferiorly and hemostasis achieved with 2-0 silk ties as well as the harmonic scalpel. After the breast after the axillary contents had been removed, repeat scanning with the gamma finder was completed and an 8 mm node was identified that was hot and this was separated out and sent as a sentinel node for routine histology. Good hemostasis was noted. A Blake drain was brought out through a separate stab wound incision and anchored in place with 2-0 nylon. The wound was closed with interrupted 2-0 Vicryl figure-of-eight sutures to the deep layer and running 4-0 Vicryl subcuticular suture for the skin. Dermabond was applied to the skin in the axilla. Benzoin and Steri-Strips were applied to the skin over the wide excision site. A Tegaderm was placed over the drain exit site. Fluff gauze was then applied to the wide excision site and the patient's sports bra applied. She tolerated procedure well and was taken to the recovery room in stable condition.  ____________________________ [name], MD jwb:ct D: 06/21/2012 20:29:43 ET T: 06/22/2012 10:01:54 ET JOB#: [id number]  cc: [name], MD, <Dictator> Amariyon Maynes Brion Aliment  MD ELECTRONICALLY SIGNED 06/23/2012 14:52

## 2014-09-01 ENCOUNTER — Other Ambulatory Visit: Payer: Self-pay

## 2014-09-01 DIAGNOSIS — C50911 Malignant neoplasm of unspecified site of right female breast: Secondary | ICD-10-CM

## 2014-09-06 ENCOUNTER — Encounter: Payer: Self-pay | Admitting: *Deleted

## 2014-09-08 ENCOUNTER — Inpatient Hospital Stay: Payer: Managed Care, Other (non HMO) | Attending: Hematology and Oncology

## 2014-09-08 ENCOUNTER — Inpatient Hospital Stay (HOSPITAL_BASED_OUTPATIENT_CLINIC_OR_DEPARTMENT_OTHER): Payer: Managed Care, Other (non HMO) | Admitting: Hematology and Oncology

## 2014-09-08 VITALS — BP 122/85 | HR 71 | Temp 95.0°F | Wt 167.5 lb

## 2014-09-08 DIAGNOSIS — C50411 Malignant neoplasm of upper-outer quadrant of right female breast: Secondary | ICD-10-CM

## 2014-09-08 DIAGNOSIS — Z79810 Long term (current) use of selective estrogen receptor modulators (SERMs): Secondary | ICD-10-CM

## 2014-09-08 DIAGNOSIS — Z170 Estrogen receptor positive status [ER+]: Secondary | ICD-10-CM | POA: Insufficient documentation

## 2014-09-08 DIAGNOSIS — I10 Essential (primary) hypertension: Secondary | ICD-10-CM | POA: Insufficient documentation

## 2014-09-08 DIAGNOSIS — Z9221 Personal history of antineoplastic chemotherapy: Secondary | ICD-10-CM | POA: Insufficient documentation

## 2014-09-08 DIAGNOSIS — I890 Lymphedema, not elsewhere classified: Secondary | ICD-10-CM | POA: Diagnosis not present

## 2014-09-08 DIAGNOSIS — C50911 Malignant neoplasm of unspecified site of right female breast: Secondary | ICD-10-CM

## 2014-09-08 DIAGNOSIS — Z923 Personal history of irradiation: Secondary | ICD-10-CM | POA: Insufficient documentation

## 2014-09-08 LAB — CBC WITH DIFFERENTIAL/PLATELET
Basophils Absolute: 0 10*3/uL (ref 0–0.1)
Basophils Relative: 1 %
Eosinophils Absolute: 0.2 10*3/uL (ref 0–0.7)
Eosinophils Relative: 3 %
HCT: 39.4 % (ref 35.0–47.0)
Hemoglobin: 13 g/dL (ref 12.0–16.0)
Lymphocytes Relative: 30 %
Lymphs Abs: 1.7 10*3/uL (ref 1.0–3.6)
MCH: 29.1 pg (ref 26.0–34.0)
MCHC: 33.1 g/dL (ref 32.0–36.0)
MCV: 87.9 fL (ref 80.0–100.0)
Monocytes Absolute: 0.4 10*3/uL (ref 0.2–0.9)
Monocytes Relative: 8 %
Neutro Abs: 3.4 10*3/uL (ref 1.4–6.5)
Neutrophils Relative %: 58 %
Platelets: 197 10*3/uL (ref 150–440)
RBC: 4.48 MIL/uL (ref 3.80–5.20)
RDW: 13.2 % (ref 11.5–14.5)
WBC: 5.7 10*3/uL (ref 3.6–11.0)

## 2014-09-09 LAB — CANCER ANTIGEN 27.29: CA 27.29: 20.5 U/mL (ref 0.0–38.6)

## 2014-10-05 IMAGING — MG MM CAD DIAGNOSTIC MAMMO
1 series · 8 of 8 positions shown · non-contrast
Comparison: none

REASON FOR EXAM: RT BR LUMP
COMMENTS:

PROCEDURE:     MAM - MAM DGTL DIAGNOSTIC MAMMO W/CAD  - May 18, 2012 [DATE]
RESULT:      A history: Right breast lump.

[R CC · right · 8 of 8 slices shown]
[im 1/8]
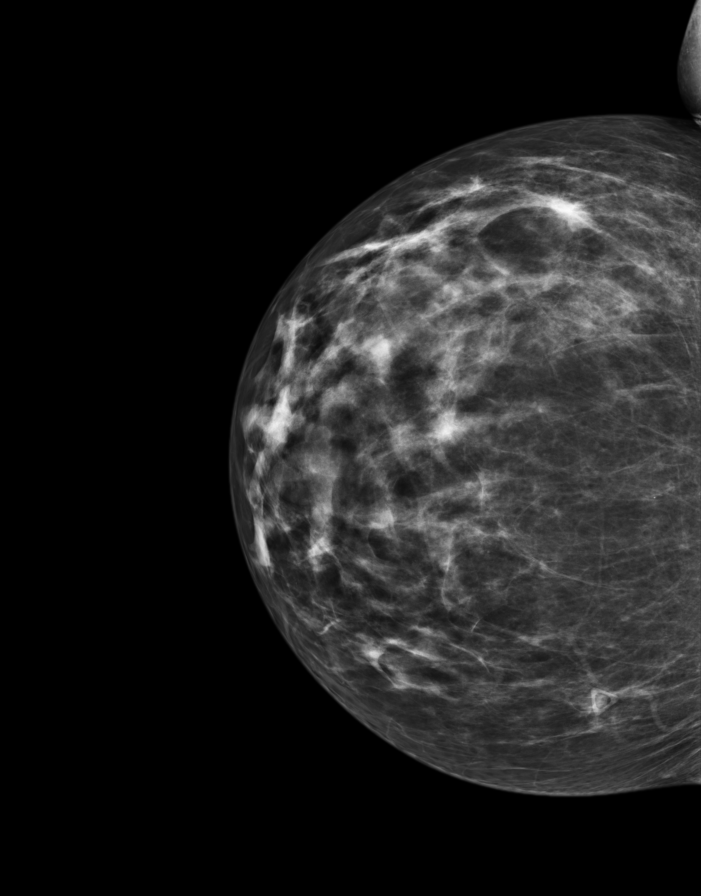
[im 2/8]
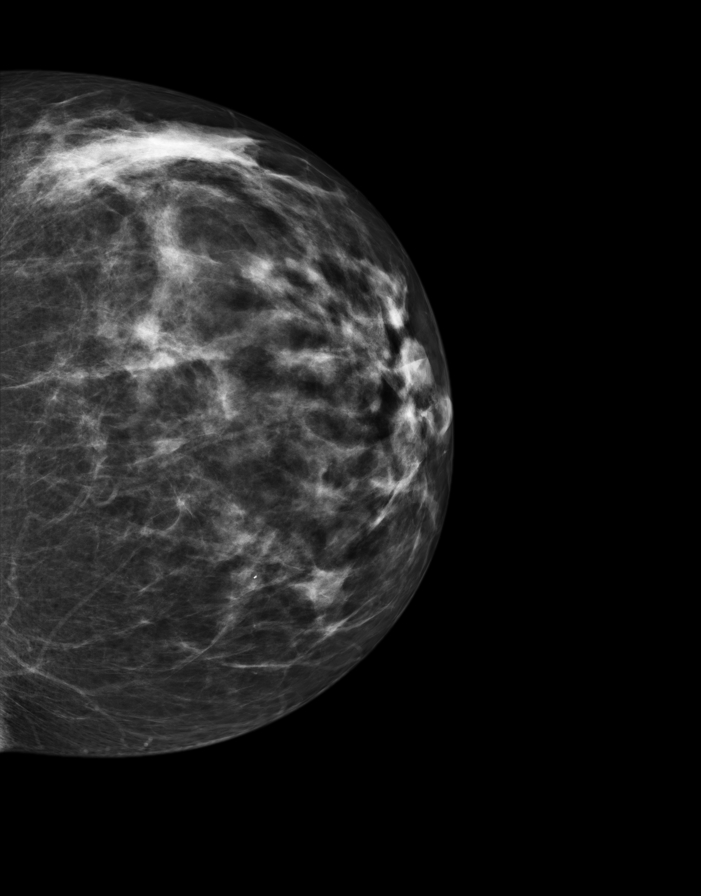
[im 3/8]
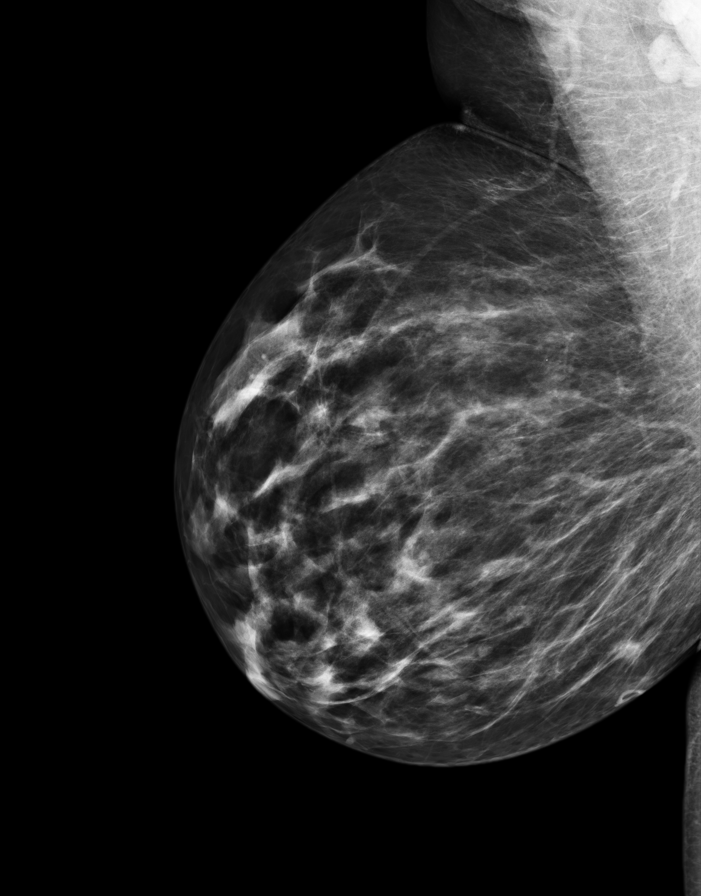
[im 4/8]
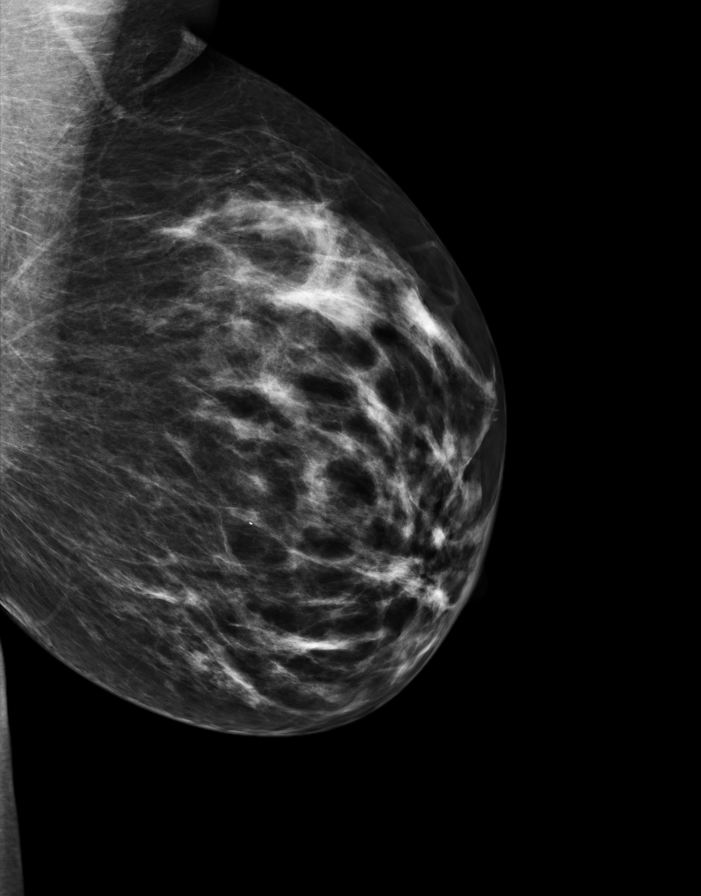
[im 5/8]
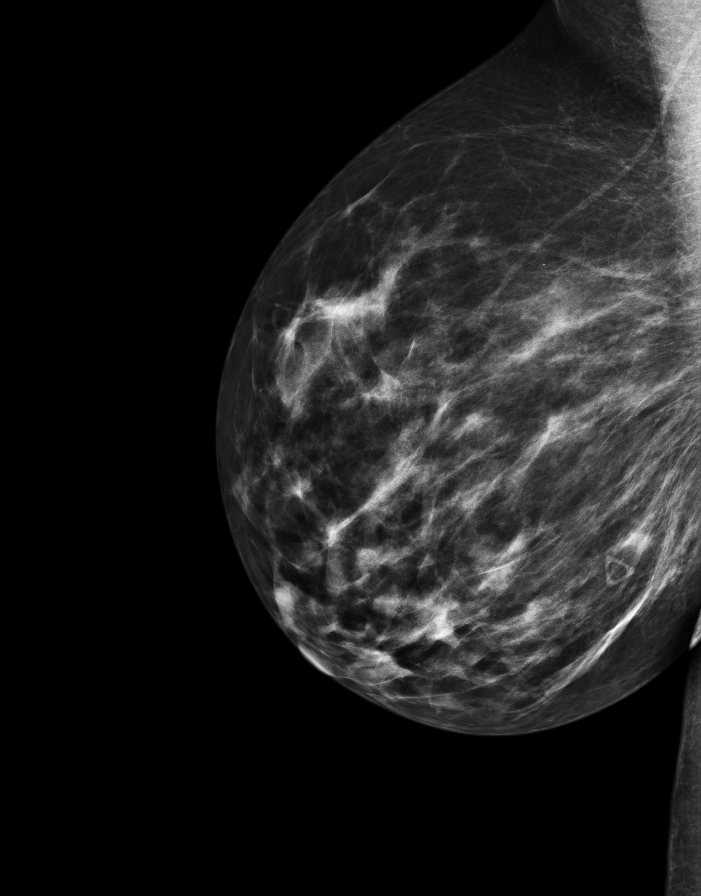
[im 6/8]
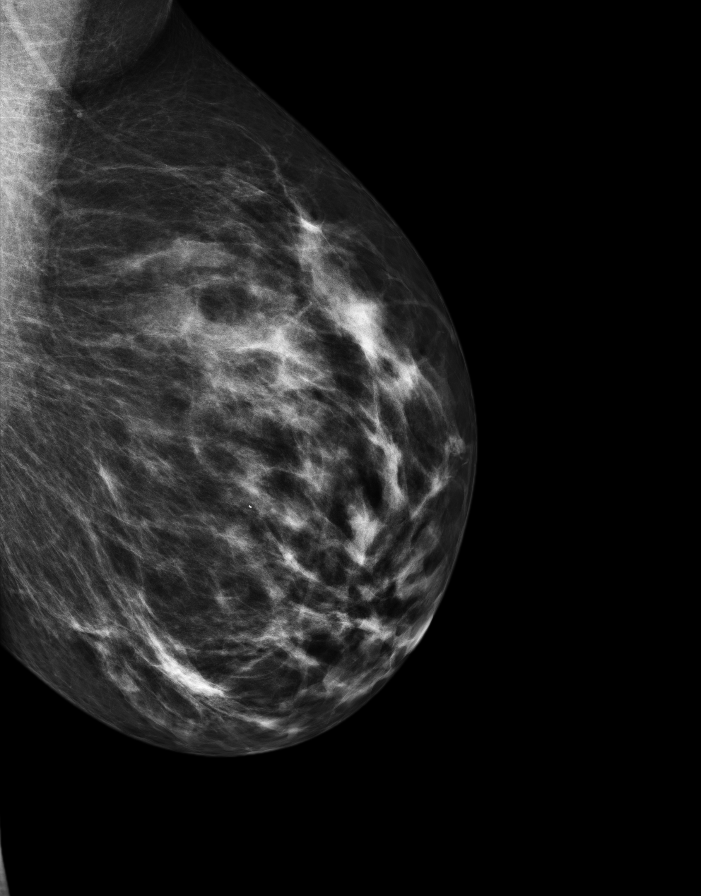
[im 7/8]
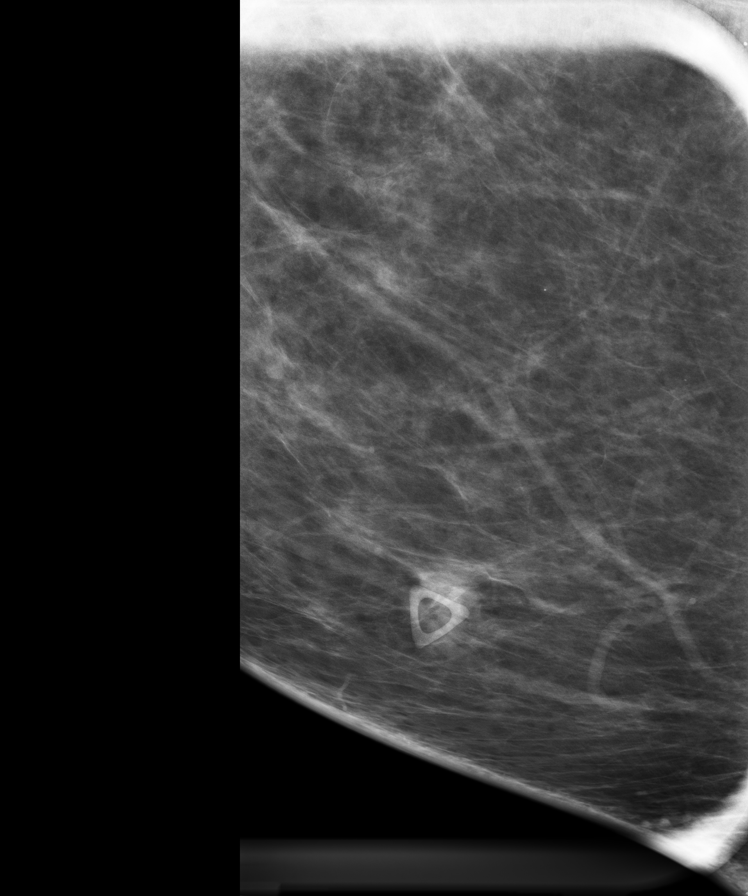
[im 8/8]
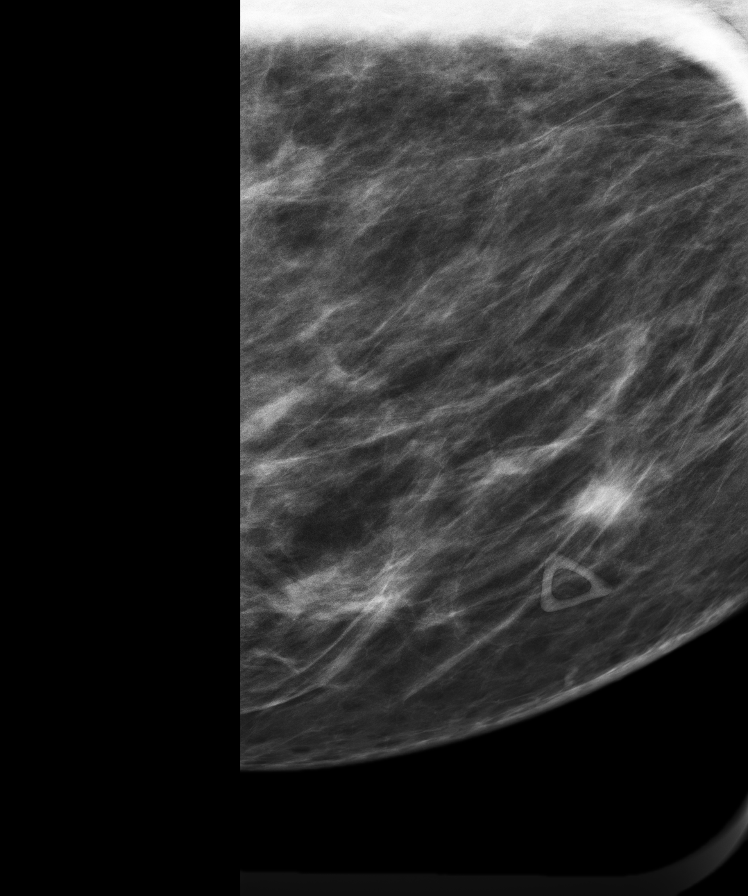

[8 of 8 positions shown; findings below may reference images not displayed]

FINDINGS: Standard mammography and obtained with compression spot films of
the right breast. Ultrasound obtained. There is a 1.7 cm hypoechoic
irregular lesion in the inferior medial portion right breast is a new
finding from multiple prior mammograms. This density persists on compression
spot films and again is solid by ultrasound. Surgical evaluation suggested
to evaluate with biopsy.
IMPRESSION: BI-RADS: Category 4 - Suspicious Abnormality - Biopsy
Should Be Considered

Thank you for the oppurtunity to contribute to the care of your patient. .

BREAST COMPOSITION: The breast composition is HETEROGENEOUSLY DENSE
(glandular tissue is 51-75%) This may decrease the sensitivity of
mammography.

## 2014-10-05 IMAGING — US ULTRASOUND RIGHT BREAST
1 series · 14 of 25 positions shown · non-contrast
Comparison: none

REASON FOR EXAM: RT BR LUMP
COMMENTS:

PROCEDURE:     US  - US BREAST RIGHT  - May 18, 2012 [DATE]
RESULT:     History: Right breast lump.
Comparison Study: Multiple prior studies studies dating to 10/27/2008.

[Series 1: ultrasound right breast · 0.10mm/px · 14 of 27 slices shown]
[im 1/27]
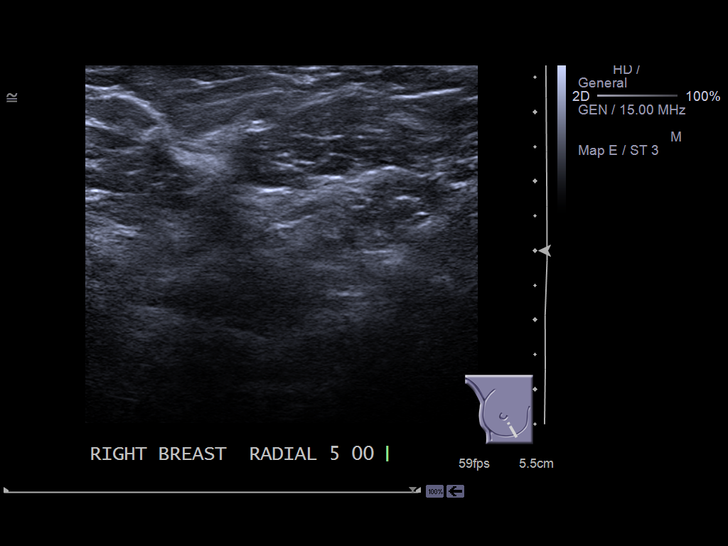
[im 3/27]
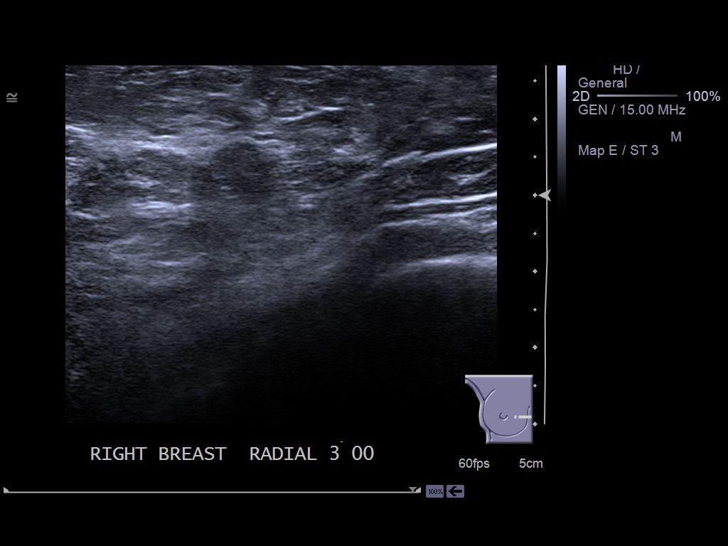
[im 5/27]
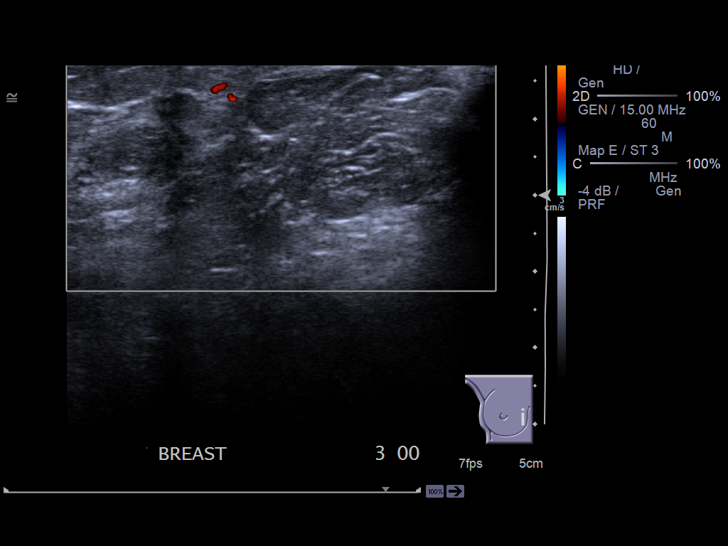
[im 7/27]
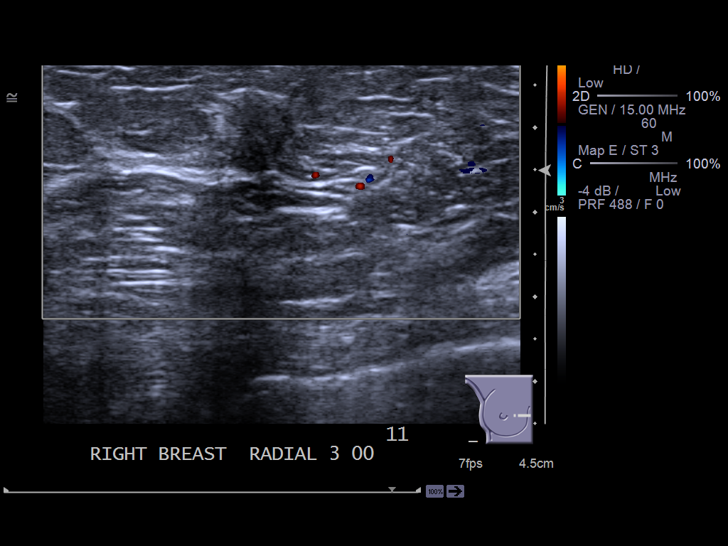
[im 9/27]
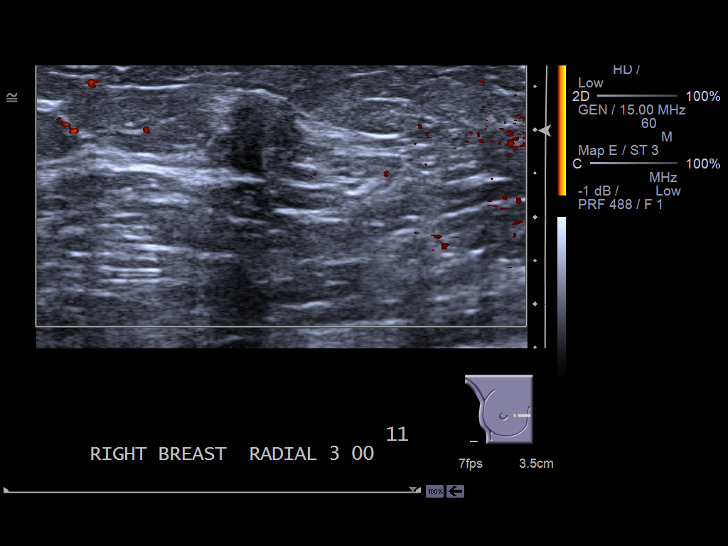
[im 10/27]
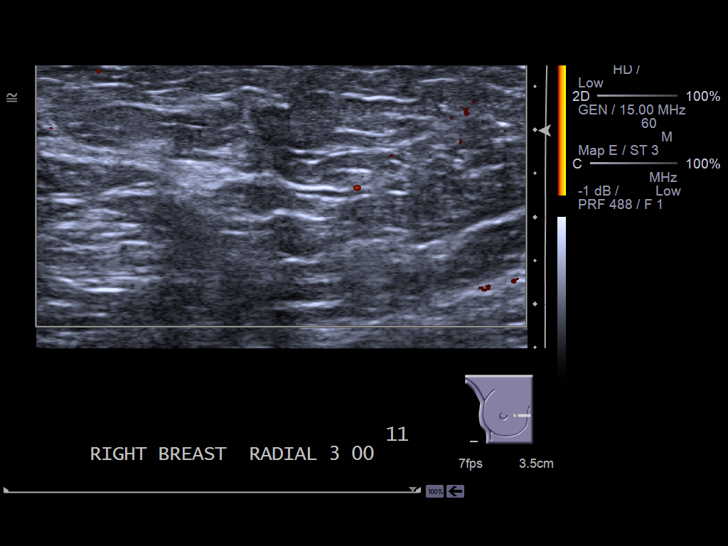
[im 12/27]
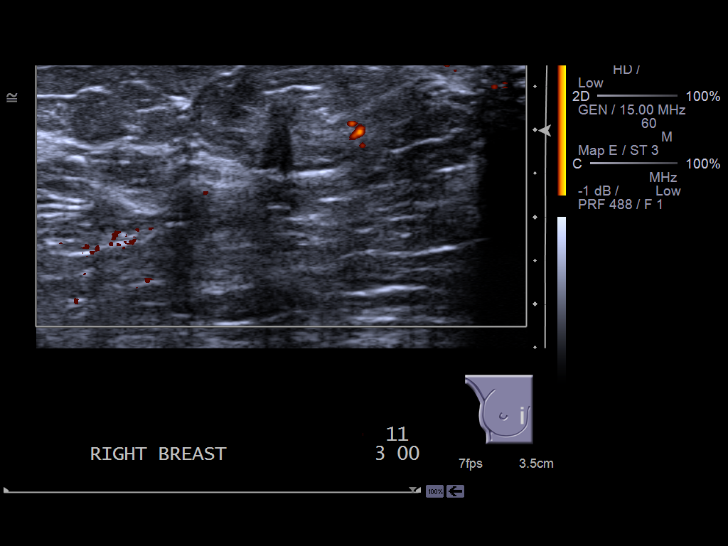
[im 15/27]
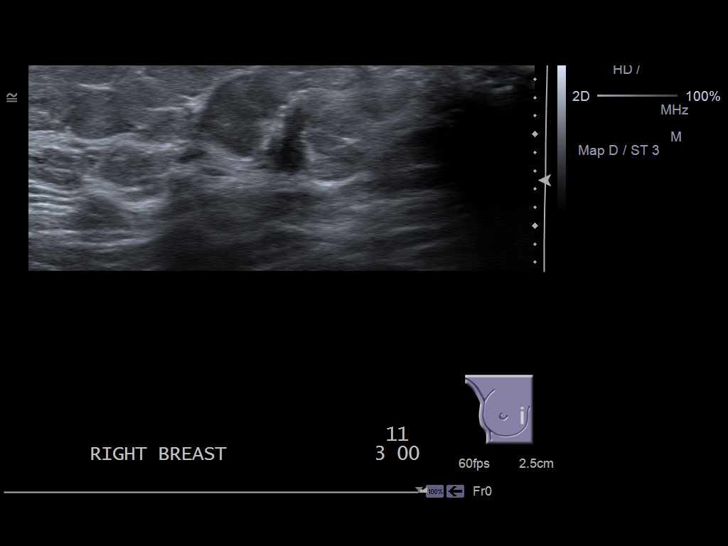
[im 17/27]
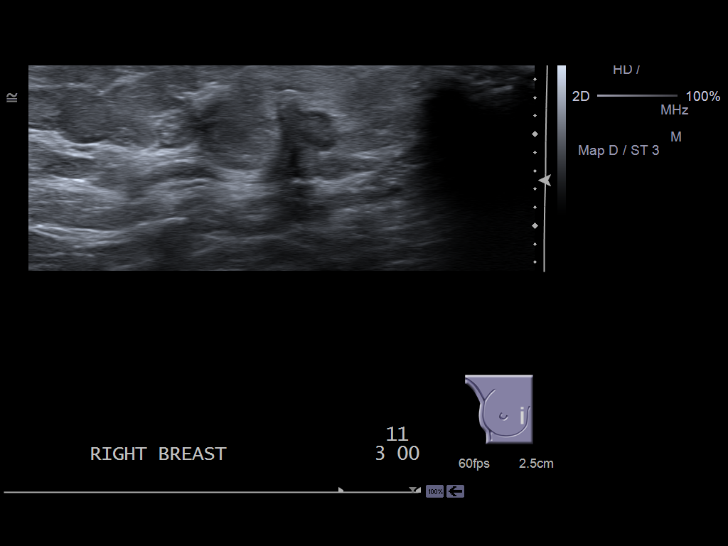
[im 18/27]
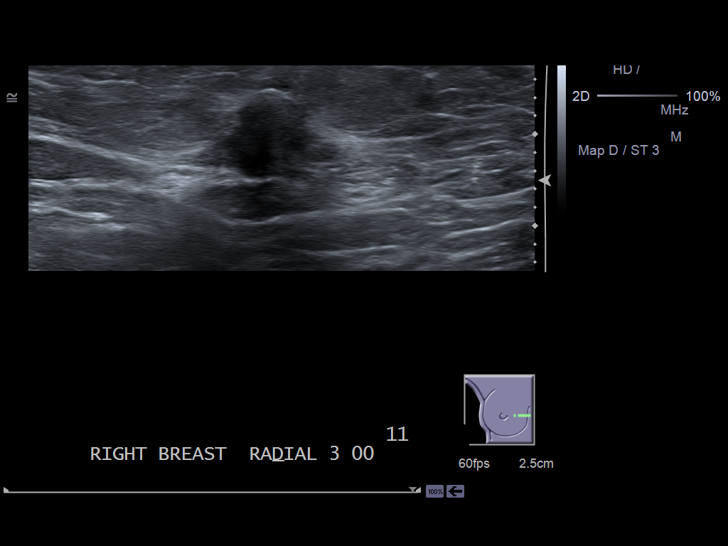
[im 20/27]
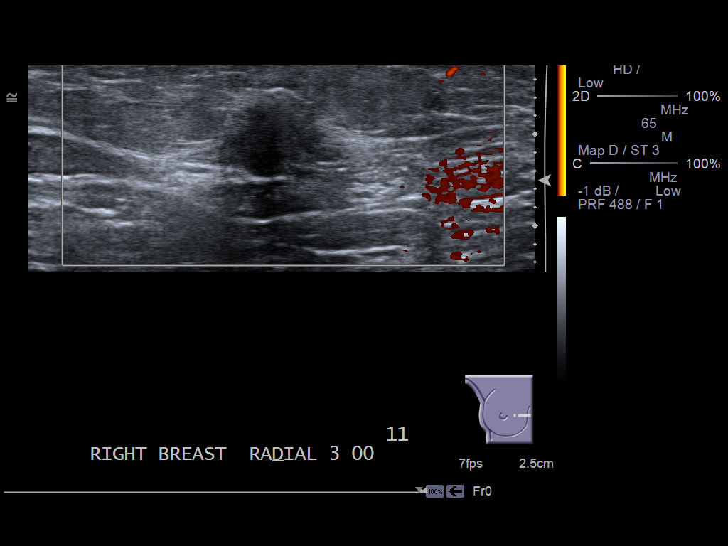
[im 22/27]
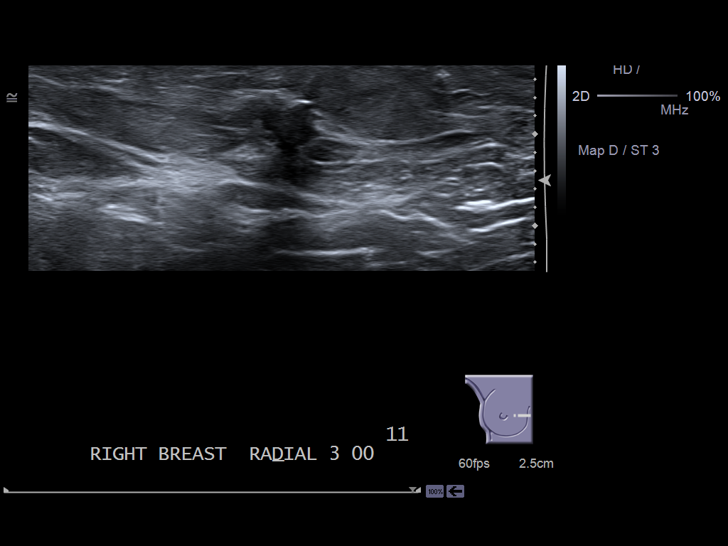
[im 24/27]
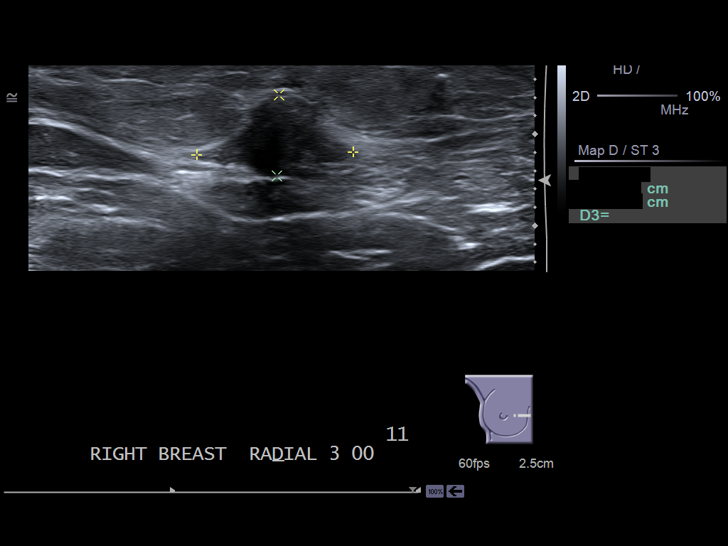
[im 27/27]
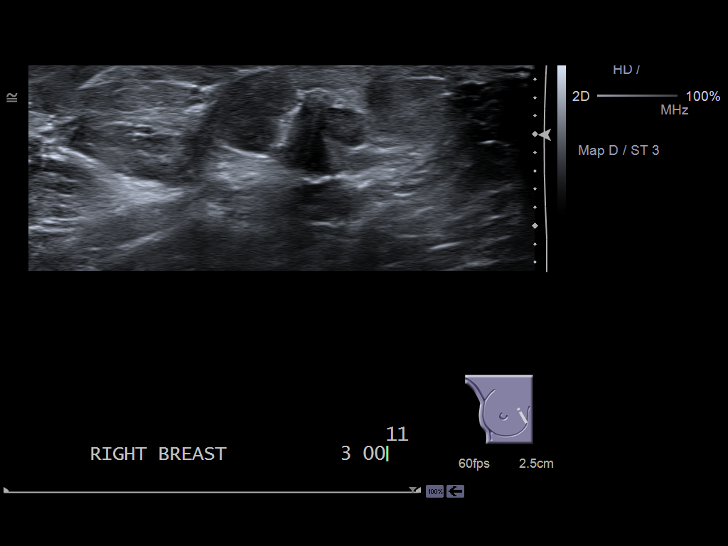

[14 of 25 positions shown; findings below may reference images not displayed]

FINDINGS: There is a new density in the medial portion of the right breast.
This is inferior medial and does not compress out on compression spot
films.. Ultrasound was performed and reveals a 1.7 cm irregular hypoechoic
lesion. This could represent a small malignancy and surgical evaluation is
suggested.
IMPRESSION: Parenchymal density in the inferior medial portion of the
right breast which is solid by ultrasound. This measures 1.7 cm. Surgical
consultation is suggested as this could represent malignancy ,biopsy should
be considered.

BI-RADS: Category 4 - Suspicious Abnormality - Biopsy Should Be Considered

Thank you for the oppurtunity to contribute to the care of your patient. .

BREAST COMPOSITION: The breast composition is HETEROGENEOUSLY DENSE
(glandular tissue is 51-75%) This may decrease the sensitivity of
mammography.

## 2014-10-11 ENCOUNTER — Other Ambulatory Visit: Payer: Self-pay | Admitting: Hematology and Oncology

## 2014-11-08 IMAGING — CT NM SENTINAL NODE INJECTION (BREAST) - NO REPORT
5 series · 23 of 40 positions shown · non-contrast
Comparison: none

REASON FOR EXAM: R br Wide exc SN BX surg 11am   [id number]
COMMENTS:

[Series 2: ac lymphosc 5.0 b08s · axial · 5.0mm · 0.98mm/px · z∈[+1520,+1804]mm · 3 of 78 slices shown]
[im 1/78  full-range]
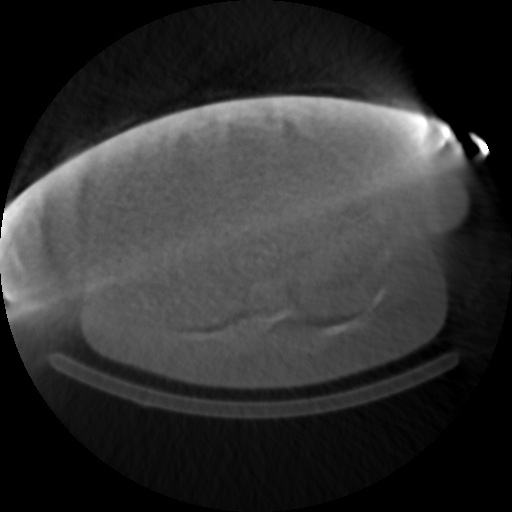
[im 38/78  mediastinal]
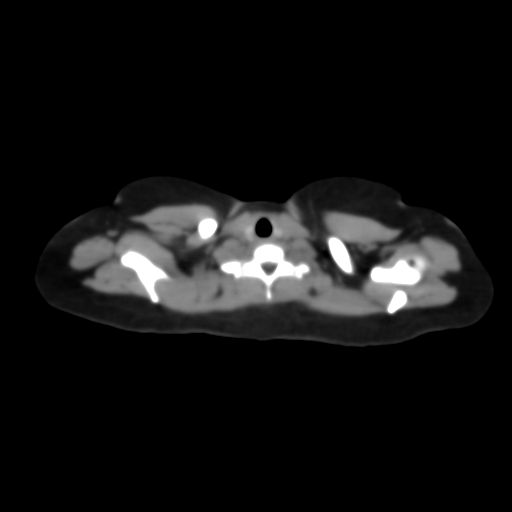
[im 58/78  mediastinal]
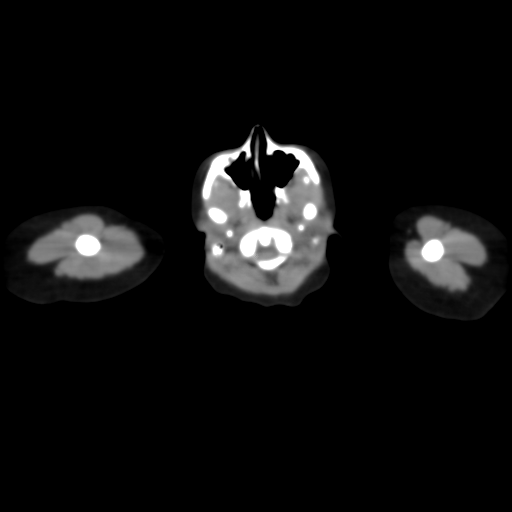

[Series 3: lymphosc 2.5 b41s · axial · 0.98mm/px · z∈[+1516,+1888]mm · 14 of 326 slices shown]
[im 1/326  mediastinal]
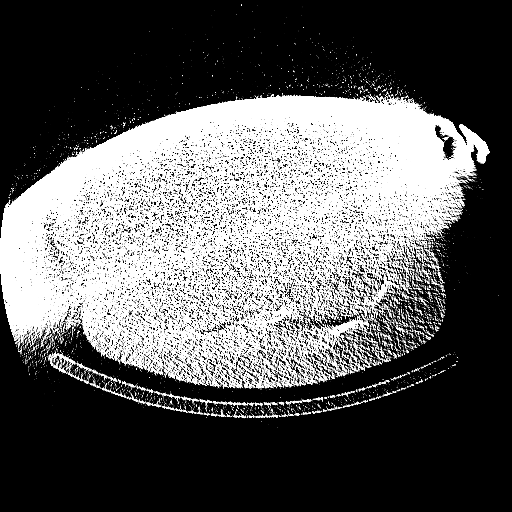
[im 15/326  mediastinal]
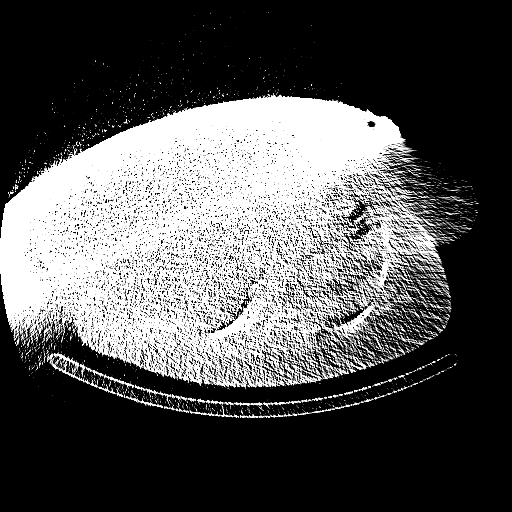
[im 45/326  mediastinal]
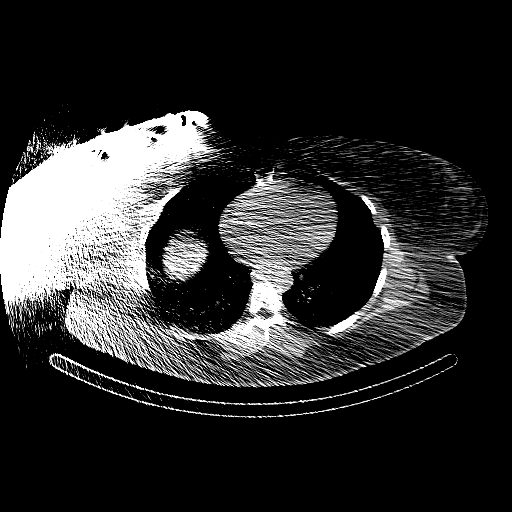
[im 74/326  mediastinal]
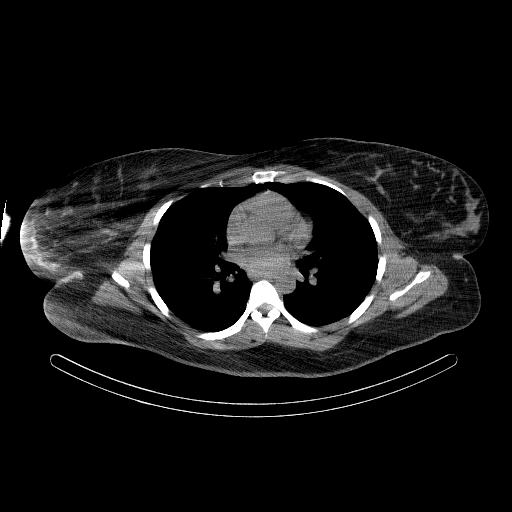
[im 104/326  mediastinal]
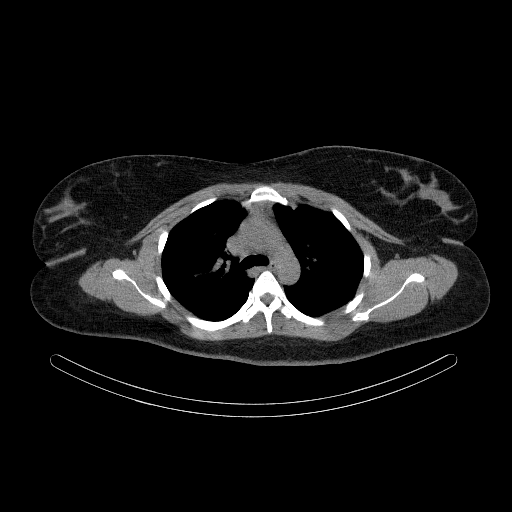
[im 119/326  mediastinal]
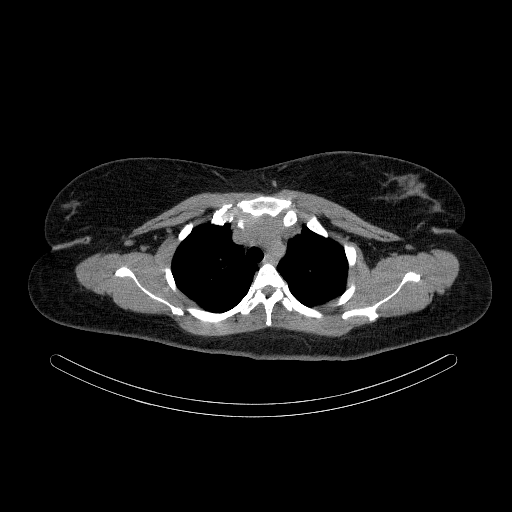
[im 148/326  mediastinal]
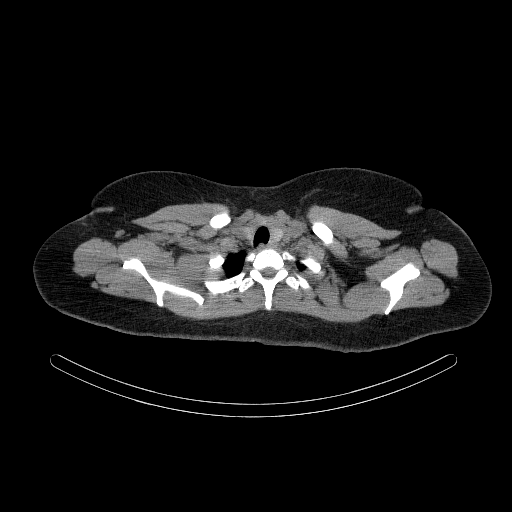
[im 163/326  mediastinal]
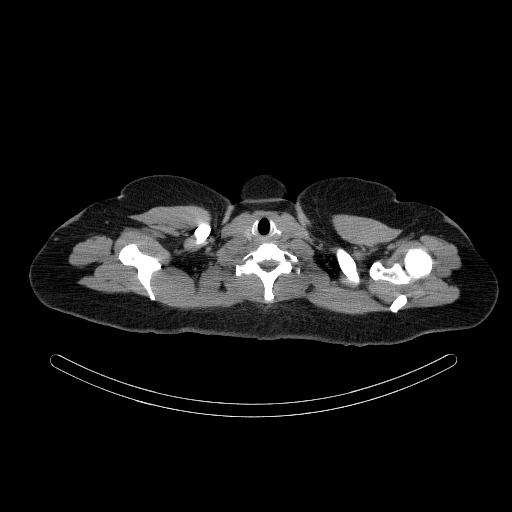
[im 193/326  mediastinal]
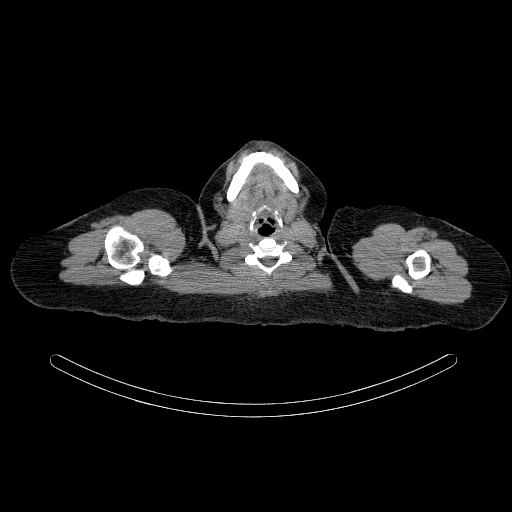
[im 207/326  mediastinal]
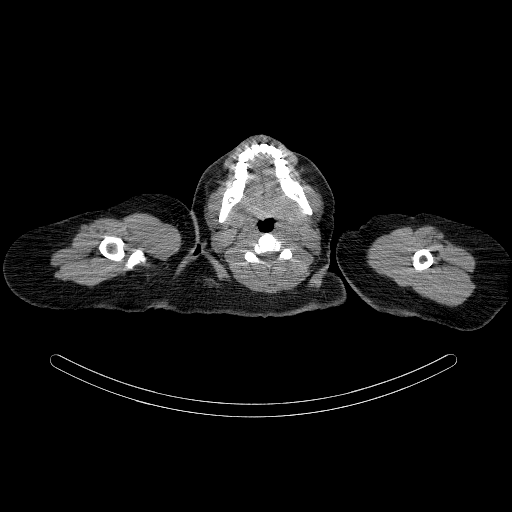
[im 237/326  mediastinal]
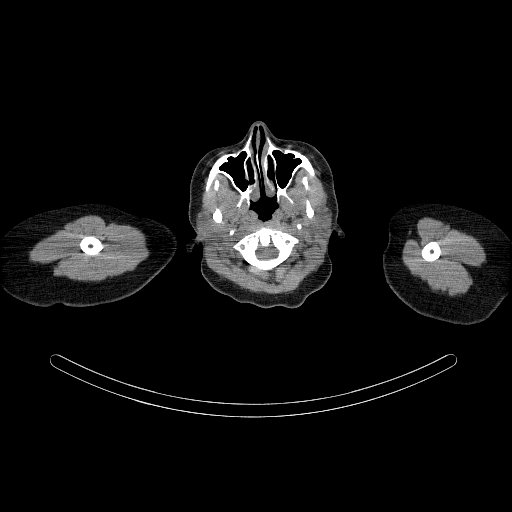
[im 266/326  mediastinal]
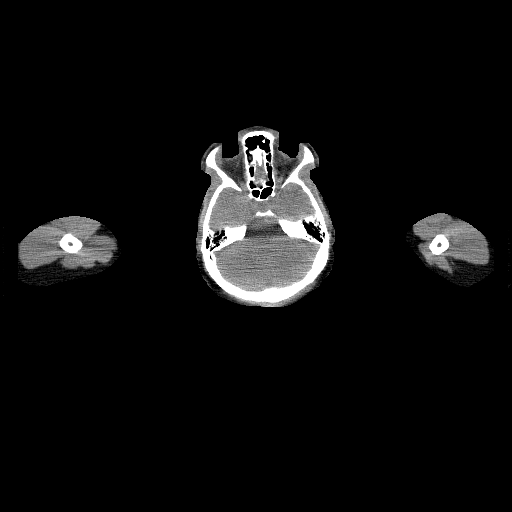
[im 281/326  mediastinal]
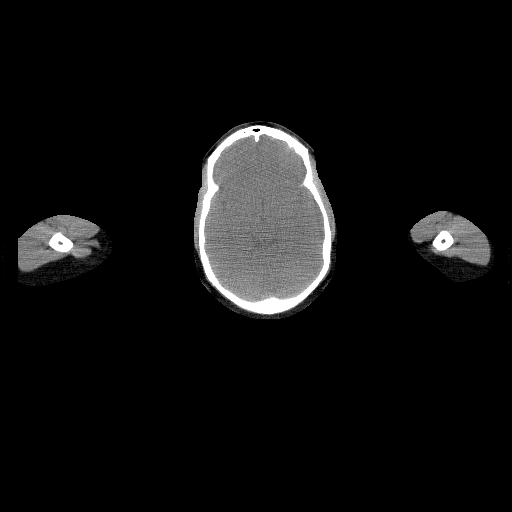
[im 311/326  mediastinal]
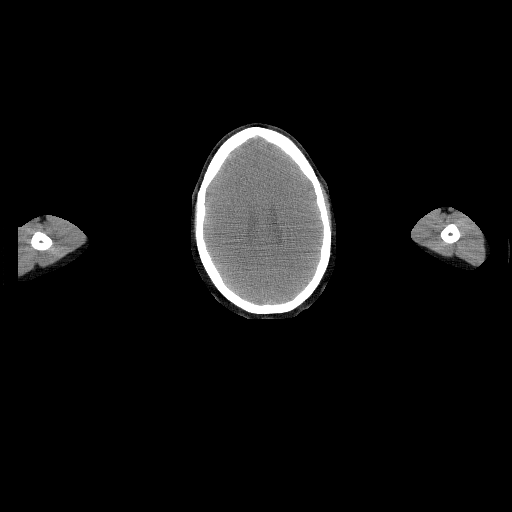

[Series 1000: sent node breast static · 2.40mm/px · 2 acquisitions, 1 frame shown (1 of 2)]
[im 1/2]
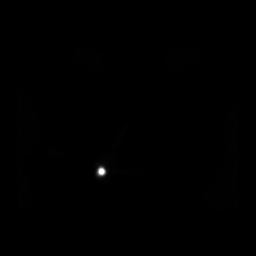

[Series 1000: lymphoscintigraphy · 4.80mm/px · 4 of 160 frames shown]
[frame 14/160]
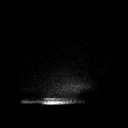
[frame 40/160]
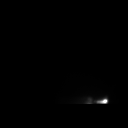
[frame 94/160]
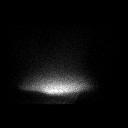
[frame 147/160]
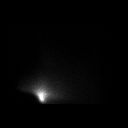

[Series 1000: sent node breast static · 2.40mm/px · 1 of 2 frames shown (2 of 2)]
[frame 2/2]
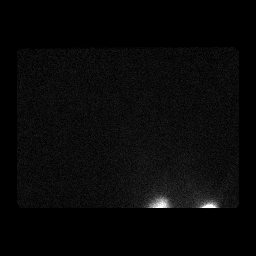

[23 of 40 positions shown; findings below may reference images not displayed]

PROCEDURE:     NM  - NM SENTINEL NODE  BREAST  - June 21, 2012 [DATE]

RESULT:      Comparison: None.

Radiopharmaceutical:  2.22 mCi Jc-TTm unfiltered sulfur colloid. The dose
was divided into 4 doses of approximately 0.3 uCi to be injected in the
region of the mass in the medial right breast and the remainder of the dose
in the lateral periareolar tissue.

Technique and findings:
The skin was prepped in sterile fashion. The mass in the inferior medial
right breast was located by palpation. Then, 4 subdermal injections were
made in the 4 quadrant surrounding the mass. Then, the standard lateral
periareolar injection was made. Planar images were obtained in the anterior
projection with the arm abducted. No definite sentinel nodes were seen on
the planar images at 65 minutes, though there was significant artifact from
the injection site. SPECT CT images were also performed per request of the
ordering physician. These images demonstrate 3 foci of radiotracer activity
in the soft tissues of the anterior thorax just to the right of midline, and
medial to the mass. On the screensaved fusion images there is some activity
in the region of the right supraclavicular fossa, but this is likely related
to scar artifact given the appearance on the coronal images and the
difficulty with SPECT related to the low counts. Their may be subtle
activity in a right axillary lymph node.

Small, 6 mm sclerotic density in the T3 vertebral body may represent a bone
island, but is nonspecific.
IMPRESSION: 1. Successful injection of radiotracer into the right breast lateral
periareolar soft tissues and in the 4 quadrants surrounding the mass in the
medial right breast for intraoperative localization of sentinel lymph nodes
with a gamma probe. There are 3 foci of radiotracer activity in the
relatively superficial the tissues just to the right of midline, medial to
the mass in the right breast.
2. Small round sclerotic density in the T3 vertebral body may represent a
bone island, but is nonspecific.

This was discussed with Dr. Alia Tiger at [DATE] hours 06/21/2012.

## 2014-11-09 ENCOUNTER — Ambulatory Visit: Payer: Managed Care, Other (non HMO) | Attending: Radiation Oncology | Admitting: Radiation Oncology

## 2014-12-21 ENCOUNTER — Encounter: Payer: Self-pay | Admitting: Radiation Oncology

## 2014-12-21 ENCOUNTER — Ambulatory Visit
Admission: RE | Admit: 2014-12-21 | Discharge: 2014-12-21 | Disposition: A | Discharge status: Home / Self Care | Payer: Managed Care, Other (non HMO) | Source: Ambulatory Visit | Attending: Radiation Oncology | Admitting: Radiation Oncology

## 2014-12-21 VITALS — BP 136/93 | HR 64 | Temp 96.6°F | Resp 20 | Wt 169.2 lb

## 2014-12-21 DIAGNOSIS — C50911 Malignant neoplasm of unspecified site of right female breast: Secondary | ICD-10-CM

## 2014-12-21 NOTE — Progress Notes (Signed)
Radiation Oncology Follow up Note  Name: Natalie Gallagher   Date:   12/21/2014 MRN:  [id number] DOB: 1970-05-17    This 44 y.o. female presents to the clinic today for follow-up for breast cancer.  REFERRING PROVIDER: No ref. provider found  HPI: Patient is a 44 year old female now out a year and a half having completed radiation therapy to her right breast stage IA lesion (T1 CN 0 M0) invasive mammary carcinoma ER/PR positive HER-2/neu not overexpressed. She received whole breast radiation. She had a limited axillary lymph node sampling. Of 13 lymph nodes. She did not receive peripheral lymphatic radiation she is seen today in routine follow-up and is doing well follow-up. Patient did receive adjuvant chemotherapy also and is currently on aromatase inhibitor tolerating that well. She is wearing a sleeve for lymphedema of her right upper extremity.  COMPLICATIONS OF TREATMENT: none  FOLLOW UP COMPLIANCE: keeps appointments   PHYSICAL EXAM:  BP 136/93 mmHg  Pulse 64  Temp(Src) 96.6 F (35.9 C)  Resp 20  Wt 169 lb 3.3 oz (76.75 kg) Well-developed female in NAD.Lungs are clear to A&P cardiac examination essentially unremarkable with regular rate and rhythm. No dominant mass or nodularity is noted in either breast in 2 positions examined. Incision is well-healed. No axillary or supraclavicular adenopathy is appreciated. Cosmetic result is excellent. She is wearing a compression sleeve of the right upper extremity. Well-developed well-nourished patient in NAD. HEENT reveals PERLA, EOMI, discs not visualized.  Oral cavity is clear. No oral mucosal lesions are identified. Neck is clear without evidence of cervical or supraclavicular adenopathy. Lungs are clear to A&P. Cardiac examination is essentially unremarkable with regular rate and rhythm without murmur rub or thrill. Abdomen is benign with no organomegaly or masses noted. Motor sensory and DTR levels are equal and symmetric in the upper and lower  extremities. Cranial nerves II through XII are grossly intact. Proprioception is intact. No peripheral adenopathy or edema is identified. No motor or sensory levels are noted. Crude visual fields are within normal range.   RADIOLOGY RESULTS: Recent mammogram results are reviewed compatible no evidence of disease  PLAN: Present time patient is doing well with no evidence of disease. I've asked her to try to going without her sleeve for a while and doing some right upper extremity exercises. I've also asked her to have surgeon evaluate her lymphedema in the near future. She had limited axillary dissection and no radiation therapy to her axillary nodes. She continues on aromatase inhibitor therapy without side effect. I have asked to see her back in 1 year for follow-up. She knows to call sooner with any concerns.  I would like to take this opportunity for allowing me to participate in the care of your patient.Armstead Peaks., MD

## 2015-01-08 ENCOUNTER — Other Ambulatory Visit: Payer: Self-pay

## 2015-01-08 DIAGNOSIS — C50911 Malignant neoplasm of unspecified site of right female breast: Secondary | ICD-10-CM

## 2015-01-12 ENCOUNTER — Inpatient Hospital Stay (HOSPITAL_BASED_OUTPATIENT_CLINIC_OR_DEPARTMENT_OTHER): Payer: Managed Care, Other (non HMO) | Admitting: Hematology and Oncology

## 2015-01-12 ENCOUNTER — Inpatient Hospital Stay: Payer: Managed Care, Other (non HMO) | Attending: Hematology and Oncology

## 2015-01-12 VITALS — BP 136/81 | HR 82 | Temp 98.0°F | Wt 170.9 lb

## 2015-01-12 DIAGNOSIS — Z170 Estrogen receptor positive status [ER+]: Secondary | ICD-10-CM | POA: Diagnosis not present

## 2015-01-12 DIAGNOSIS — F419 Anxiety disorder, unspecified: Secondary | ICD-10-CM | POA: Insufficient documentation

## 2015-01-12 DIAGNOSIS — Z79810 Long term (current) use of selective estrogen receptor modulators (SERMs): Secondary | ICD-10-CM | POA: Insufficient documentation

## 2015-01-12 DIAGNOSIS — C50911 Malignant neoplasm of unspecified site of right female breast: Secondary | ICD-10-CM

## 2015-01-12 DIAGNOSIS — I10 Essential (primary) hypertension: Secondary | ICD-10-CM

## 2015-01-12 DIAGNOSIS — Z923 Personal history of irradiation: Secondary | ICD-10-CM | POA: Diagnosis not present

## 2015-01-12 DIAGNOSIS — I890 Lymphedema, not elsewhere classified: Secondary | ICD-10-CM

## 2015-01-12 DIAGNOSIS — Z9221 Personal history of antineoplastic chemotherapy: Secondary | ICD-10-CM | POA: Insufficient documentation

## 2015-01-12 DIAGNOSIS — C50411 Malignant neoplasm of upper-outer quadrant of right female breast: Secondary | ICD-10-CM

## 2015-01-12 LAB — CBC WITH DIFFERENTIAL/PLATELET
Basophils Absolute: 0.1 10*3/uL (ref 0–0.1)
Basophils Relative: 1 %
Eosinophils Absolute: 0.1 10*3/uL (ref 0–0.7)
Eosinophils Relative: 2 %
HCT: 39.7 % (ref 35.0–47.0)
Hemoglobin: 13.5 g/dL (ref 12.0–16.0)
Lymphocytes Relative: 28 %
Lymphs Abs: 1.5 10*3/uL (ref 1.0–3.6)
MCH: 29.4 pg (ref 26.0–34.0)
MCHC: 34.1 g/dL (ref 32.0–36.0)
MCV: 86.2 fL (ref 80.0–100.0)
Monocytes Absolute: 0.3 10*3/uL (ref 0.2–0.9)
Monocytes Relative: 5 %
Neutro Abs: 3.4 10*3/uL (ref 1.4–6.5)
Neutrophils Relative %: 64 %
Platelets: 207 10*3/uL (ref 150–440)
RBC: 4.61 MIL/uL (ref 3.80–5.20)
RDW: 13 % (ref 11.5–14.5)
WBC: 5.3 10*3/uL (ref 3.6–11.0)

## 2015-01-12 LAB — COMPREHENSIVE METABOLIC PANEL
ALT: 19 U/L (ref 14–54)
AST: 22 U/L (ref 15–41)
Albumin: 3.6 g/dL (ref 3.5–5.0)
Alkaline Phosphatase: 52 U/L (ref 38–126)
Anion gap: 4 — ABNORMAL LOW (ref 5–15)
BUN: 14 mg/dL (ref 6–20)
CO2: 29 mmol/L (ref 22–32)
Calcium: 8.5 mg/dL — ABNORMAL LOW (ref 8.9–10.3)
Chloride: 103 mmol/L (ref 101–111)
Creatinine, Ser: 0.78 mg/dL (ref 0.44–1.00)
GFR calc Af Amer: >60 mL/min (ref >60)
GFR calc non Af Amer: >60 mL/min (ref >60)
Glucose, Bld: 90 mg/dL (ref 65–99)
Potassium: 3.7 mmol/L (ref 3.5–5.1)
Sodium: 136 mmol/L (ref 135–145)
Total Bilirubin: 0.4 mg/dL (ref 0.3–1.2)
Total Protein: 7.4 g/dL (ref 6.5–8.1)

## 2015-01-12 NOTE — Progress Notes (Signed)
[facility] day:  09/08/2014  Chief Complaint: Natalie Gallagher is a 44 y.o. female with stage IA right breast cancer who is seen for 4 month assessment.  HPI: The patient was last seen in the medical oncology clinic on 05/12/2014.  At that time, she denied any breast concerns or. She had lymphedema in her right arm.  She had an appointment in [facility] for the lymphedema clinic. Exam was unremarkable. She was to continue her tamoxifen.  Mammogram on 07/07/2014 revealed slight lumpectomy changes of the posterior third of the inner lower right breast. There was a 4 cm circumscribed partially visualized oval mass deep along the left chest wall along the lower central right breast just deep to the lumpectomy.  During the interim, the patient has done well. She states that she had her yearly pelvic exam in April or May. Still needs her eye exam. She performs monthly breast exams. She denies any concerns. She notes a periodic feelings of heat "surge" sees associated with postmenopausal symptoms.  Past Medical History  Diagnosis Date  . Hypertension   . Cancer 2014    right breast.right wide excision with sentinal node biopsy on 06/21/12  . Breast cancer of upper-outer quadrant of right female breast March 2014    T1c,N0,M0; BRCA neg. ER: 60%; PR: 70%, her 2 neu not over expressed.     Past Surgical History  Procedure Laterality Date  . Breast surgery Right 06/21/2012    right wide excision with sentinal node biopsy/axilary dissection.    No family history on file.  Social History:  reports that she has never smoked. She does not have any smokeless tobacco history on file. She reports that she does not drink alcohol or use illicit drugs.  The patient is alone today.  Allergies:  Allergies  Allergen Reactions  . Tape     The patient showed a cutaneous reaction to Telfa applied beneath a Tegaderm dressing. Telfa appears to be the primary offensive  material.    Current Medications: Current Outpatient Prescriptions  Medication Sig Dispense Refill  . Multiple Vitamins-Calcium (VIACTIV MULTI-VITAMIN PO) Take 2 tablets by mouth daily.    . tamoxifen (NOLVADEX) 20 MG tablet TAKE 1 TABLET BY MOUTH AT NIGHT AT BEDTIME 30 tablet 3   No current facility-administered medications for this visit.    Review of Systems:  GENERAL:  Feels good.  Active.  No fevers, sweats or weight loss. PERFORMANCE STATUS (ECOG):  1 HEENT:  No visual changes, runny nose, sore throat, mouth sores or tenderness. Lungs: No shortness of breath or cough.  No hemoptysis. Cardiac:  No chest pain, palpitations, orthopnea, or PND. GI:  No nausea, vomiting, diarrhea, constipation, melena or hematochezia. GU:  No urgency, frequency, dysuria, or hematuria. Musculoskeletal:  No back pain.  No joint pain.  No muscle tenderness. Extremities:  No pain or swelling. Skin:  No rashes or skin changes. Neuro:  No headache, numbness or weakness, balance or coordination issues. Endocrine:  No diabetes, thyroid issues, hot flashes or night sweats. Psych:  No mood changes, depression or anxiety. Pain:  No focal pain. Review of systems:  All other systems reviewed and found to be negative.  Physical Exam: Blood pressure 122/85, pulse 71, temperature 95 F (35 C), temperature source Tympanic, weight 167 lb 8.8 oz (76 kg). GENERAL:  Well developed, well nourished, sitting comfortably in the exam room in no acute distress. MENTAL STATUS:  Alert and oriented to person, place  and time. HEAD:  Curly black hair.  Normocephalic, atraumatic, face symmetric, no Cushingoid features. EYES:  Brown eyes.  Pupils equal round and reactive to light and accomodation.  No conjunctivitis or scleral icterus. ENT:  Oropharynx clear without lesion.  Tongue normal. Mucous membranes moist.  RESPIRATORY:  Clear to auscultation without rales, wheezes or rhonchi. CARDIOVASCULAR:  Regular rate and rhythm  without murmur, rub or gallop. BREAST:  Right breast with moderate inferior quadrant post-operative and post radiation changes.  Significant fibrocystic changes inferior left breast.  No discrete masses, skin changes or nipple discharge.   ABDOMEN:  Soft, non-tender, with active bowel sounds, and no hepatosplenomegaly.  No masses. SKIN:  No rashes, ulcers or lesions. EXTREMITIES: Lymphedema No edema, no skin discoloration or tenderness.  No palpable cords. LYMPH NODES: No palpable cervical, supraclavicular, axillary or inguinal adenopathy  NEUROLOGICAL: Unremarkable. PSYCH:  Appropriate.  Appointment on 09/08/2014  Component Date Value Ref Range Status  . WBC 09/08/2014 5.7  3.6 - 11.0 K/uL Final  . RBC 09/08/2014 4.48  3.80 - 5.20 MIL/uL Final  . Hemoglobin 09/08/2014 13.0  12.0 - 16.0 g/dL Final  . HCT 09/08/2014 39.4  35.0 - 47.0 % Final  . MCV 09/08/2014 87.9  80.0 - 100.0 fL Final  . MCH 09/08/2014 29.1  26.0 - 34.0 pg Final  . MCHC 09/08/2014 33.1  32.0 - 36.0 g/dL Final  . RDW 09/08/2014 13.2  11.5 - 14.5 % Final  . Platelets 09/08/2014 197  150 - 440 K/uL Final  . Neutrophils Relative % 09/08/2014 58   Final  . Neutro Abs 09/08/2014 3.4  1.4 - 6.5 K/uL Final  . Lymphocytes Relative 09/08/2014 30   Final  . Lymphs Abs 09/08/2014 1.7  1.0 - 3.6 K/uL Final  . Monocytes Relative 09/08/2014 8   Final  . Monocytes Absolute 09/08/2014 0.4  0.2 - 0.9 K/uL Final  . Eosinophils Relative 09/08/2014 3   Final  . Eosinophils Absolute 09/08/2014 0.2  0 - 0.7 K/uL Final  . Basophils Relative 09/08/2014 1   Final  . Basophils Absolute 09/08/2014 0.0  0 - 0.1 K/uL Final  . CA 27.29 09/08/2014 20.5  0.0 - 38.6 U/mL Final   Comment: (NOTE) Bayer Centaur/ACS methodology Performed At: [facility] [address] [place], [place] [id number] Lindon Romp MD PI:[id number]     Assessment:  Natalie Gallagher is a 44 y.o. female with a history of stage IA invasive mammary carcinoma right  breast status post wide excision and axillary lymph node dissection on 06/21/2012.  Pathology revealed a 1.7 cm grade III ductal carcinoma.  Thirteen lymph nodes were negative.  Tumor was ER/PR positive and HER-2/neu negative. BRCA 1 and 2 testing was negative.  Oncotype DX was 29 (high) which translates into a distant recurrence of 19% at 10 yrs with tamoxifen alone.  The patient received 4 cycles of adriamycin and Cytoxan (08/17/2012 - 10/19/2012) followed by 12 weeks of Taxol (11/19/2012 - 01/25/2013).  She completed local radiation on 04/20/2013.  She has been on tamoxifen since 04/21/2013.    Mammogram on 07/07/2013 was benign.  Mammogram and ultrasound on 07/07/2014 revealed slight lumpectomy changes of the posterior third of the inner lower right breast. There was a 4 cm circumscribed partially visualized oval mass deep along the left chest wall along the lower central right breast just deep to the lumpectomy.  Symptomatically, she is doing well.  She has right upper extremity lymphedema.  Breast exam reveals post-operative and  post radiation changes.   Plan: 1. Labs today:  CBC with diff, CMP, CA27.29 2. Continue tamoxifen. 3. Encourage follow-up eye exam. 4. Obtain copy of mammogram and ultrasound on 07/07/2014. 5. RTC in 4 months for MD assessment and labs (CBC, CMP, CA27.29)   Lequita Asal, MD  09/08/2014

## 2015-01-12 NOTE — Progress Notes (Signed)
Patient had no concerns today.  

## 2015-01-13 LAB — CANCER ANTIGEN 27.29: CA 27.29: 17 U/mL (ref 0.0–38.6)

## 2015-01-15 ENCOUNTER — Encounter: Payer: Self-pay | Admitting: Hematology and Oncology

## 2015-01-15 NOTE — Progress Notes (Signed)
[facility] day:  01/12/2015  Chief Complaint: Natalie Gallagher is a 44 y.o. female with stage IA right breast cancer who is seen for 4 month assessment.  HPI: The patient was last seen in the medical oncology clinic on 09/08/2014.  At that time, was doing well.  She had right upper extremity lymphedema.  Breast exam revealed post-operative and post radiation changes.  Labs were unremarkable.  CA27.29 was 20.5 (normal).  During the interim, she has felt fine. She is taking her tamoxifen. She notes some periodic hot flashes or anxiety. He notes a pocket of fluid for which Dr. Bary Castilla is following. She states that she missed her last appointment with him. She has not had a menstrual period.  She denies any breast concerns.  Past Medical History  Diagnosis Date  . Hypertension   . Cancer [facility]) 2014    right breast.right wide excision with sentinal node biopsy on 06/21/12  . Breast cancer of upper-outer quadrant of right female breast [facility]) March 2014    T1c,N0,M0; BRCA neg. ER: 60%; PR: 70%, her 2 neu not over expressed.     Past Surgical History  Procedure Laterality Date  . Breast surgery Right 06/21/2012    right wide excision with sentinal node biopsy/axilary dissection.    No family history on file.  Social History:  reports that she has never smoked. She does not have any smokeless tobacco history on file. She reports that she does not drink alcohol or use illicit drugs.  The patient is alone today.  Allergies:  Allergies  Allergen Reactions  . Tape     The patient showed a cutaneous reaction to Telfa applied beneath a Tegaderm dressing. Telfa appears to be the primary offensive material.    Current Medications: Current Outpatient Prescriptions  Medication Sig Dispense Refill  . Multiple Vitamins-Calcium (VIACTIV MULTI-VITAMIN PO) Take 2 tablets by mouth daily.    . tamoxifen (NOLVADEX) 20 MG tablet TAKE 1 TABLET BY MOUTH AT NIGHT AT  BEDTIME 30 tablet 3   No current facility-administered medications for this visit.    Review of Systems:  GENERAL:  Feels fine. No fevers, sweats or weight loss. PERFORMANCE STATUS (ECOG):  1 HEENT:  No visual changes, runny nose, sore throat, mouth sores or tenderness. Lungs: No shortness of breath or cough.  No hemoptysis. Cardiac:  No chest pain, palpitations, orthopnea, or PND. GI:  No nausea, vomiting, diarrhea, constipation, melena or hematochezia. GU:  No urgency, frequency, dysuria, or hematuria. Musculoskeletal:  No back pain.  No joint pain.  No muscle tenderness. Extremities:  No pain or swelling. Skin:  No rashes or skin changes. Neuro:  No headache, numbness or weakness, balance or coordination issues. Endocrine:  Anxiety or hot flashes.  No diabetes, thyroid issues, or night sweats. Psych:  No mood changes, depression or anxiety. Pain:  No focal pain. Review of systems:  All other systems reviewed and found to be negative.  Physical Exam: Blood pressure 136/81, pulse 82, temperature 98 F (36.7 C), temperature source Tympanic, weight 170 lb 13.7 oz (77.5 kg). GENERAL:  Well developed, well nourished, sitting comfortably in the exam room in no acute distress. MENTAL STATUS:  Alert and oriented to person, place and time. HEAD:  Curly black hair.  Normocephalic, atraumatic, face symmetric, no Cushingoid features. EYES:  Brown eyes.  Pupils equal round and reactive to light and accomodation.  No conjunctivitis or scleral icterus. ENT:  Oropharynx clear without lesion.  Tongue normal. Mucous membranes moist.  RESPIRATORY:  Clear to auscultation without rales, wheezes or rhonchi. CARDIOVASCULAR:  Regular rate and rhythm without murmur, rub or gallop. BREAST:  Right breast with moderate inferior quadrant post-operative and post radiation changes.  Significant fibrocystic changes inferior left breast.  No discrete masses, skin changes or nipple discharge.   ABDOMEN:  Soft,  non-tender, with active bowel sounds, and no hepatosplenomegaly.  No masses. SKIN:  No rashes, ulcers or lesions. EXTREMITIES: Lymphedema right upper extremity.  No edema, no skin discoloration or tenderness.  No palpable cords. LYMPH NODES: No palpable cervical, supraclavicular, axillary or inguinal adenopathy  NEUROLOGICAL: Unremarkable. PSYCH:  Appropriate.  Appointment on 01/12/2015  Component Date Value Ref Range Status  . WBC 01/12/2015 5.3  3.6 - 11.0 K/uL Final  . RBC 01/12/2015 4.61  3.80 - 5.20 MIL/uL Final  . Hemoglobin 01/12/2015 13.5  12.0 - 16.0 g/dL Final  . HCT 01/12/2015 39.7  35.0 - 47.0 % Final  . MCV 01/12/2015 86.2  80.0 - 100.0 fL Final  . MCH 01/12/2015 29.4  26.0 - 34.0 pg Final  . MCHC 01/12/2015 34.1  32.0 - 36.0 g/dL Final  . RDW 01/12/2015 13.0  11.5 - 14.5 % Final  . Platelets 01/12/2015 207  150 - 440 K/uL Final  . Neutrophils Relative % 01/12/2015 64   Final  . Neutro Abs 01/12/2015 3.4  1.4 - 6.5 K/uL Final  . Lymphocytes Relative 01/12/2015 28   Final  . Lymphs Abs 01/12/2015 1.5  1.0 - 3.6 K/uL Final  . Monocytes Relative 01/12/2015 5   Final  . Monocytes Absolute 01/12/2015 0.3  0.2 - 0.9 K/uL Final  . Eosinophils Relative 01/12/2015 2   Final  . Eosinophils Absolute 01/12/2015 0.1  0 - 0.7 K/uL Final  . Basophils Relative 01/12/2015 1   Final  . Basophils Absolute 01/12/2015 0.1  0 - 0.1 K/uL Final  . Sodium 01/12/2015 136  135 - 145 mmol/L Final  . Potassium 01/12/2015 3.7  3.5 - 5.1 mmol/L Final  . Chloride 01/12/2015 103  101 - 111 mmol/L Final  . CO2 01/12/2015 29  22 - 32 mmol/L Final  . Glucose, Bld 01/12/2015 90  65 - 99 mg/dL Final  . BUN 01/12/2015 14  6 - 20 mg/dL Final  . Creatinine, Ser 01/12/2015 0.78  0.44 - 1.00 mg/dL Final  . Calcium 01/12/2015 8.5* 8.9 - 10.3 mg/dL Final  . Total Protein 01/12/2015 7.4  6.5 - 8.1 g/dL Final  . Albumin 01/12/2015 3.6  3.5 - 5.0 g/dL Final  . AST 01/12/2015 22  15 - 41 U/L Final  . ALT  01/12/2015 19  14 - 54 U/L Final  . Alkaline Phosphatase 01/12/2015 52  38 - 126 U/L Final  . Total Bilirubin 01/12/2015 0.4  0.3 - 1.2 mg/dL Final  . GFR calc non Af Amer 01/12/2015 >60  >60 mL/min Final  . GFR calc Af Amer 01/12/2015 >60  >60 mL/min Final   Comment: (NOTE) The eGFR has been calculated using the CKD EPI equation. This calculation has not been validated in all clinical situations. eGFR's persistently <60 mL/min signify possible Chronic Kidney Disease.   . Anion gap 01/12/2015 4* 5 - 15 Final  . CA 27.29 01/12/2015 17.0  0.0 - 38.6 U/mL Final   Comment: (NOTE) Bayer Centaur/ACS methodology Performed At: [facility] [address] [place], [place] [id number] Lindon Romp MD YD:[id number]     Assessment:  Natalie Gallagher  is a 44 y.o. female with a history of stage IA invasive mammary carcinoma right breast status post wide excision and axillary lymph node dissection on 06/21/2012.  Pathology revealed a 1.7 cm grade III ductal carcinoma.  Thirteen lymph nodes were negative.  Tumor was ER/PR positive and HER-2/neu negative. BRCA 1 and 2 testing was negative.  Oncotype DX was 29 (high) which translates into a distant recurrence of 19% at 10 yrs with tamoxifen alone.  The patient received 4 cycles of adriamycin and Cytoxan (08/17/2012 - 10/19/2012) followed by 12 weeks of Taxol (11/19/2012 - 01/25/2013).  She completed local radiation on 04/20/2013.  She has been on tamoxifen since 04/21/2013.    Mammogram on 07/07/2013 was benign.  Mammogram and ultrasound on 07/07/2014 revealed slight lumpectomy changes of the posterior third of the inner lower right breast. There was a 4 cm circumscribed partially visualized oval mass deep along the left chest wall along the lower central right breast just deep to the lumpectomy.  Symptomatically, she is doing well.  She has right upper extremity lymphedema. She has mild vasomotor symptoms.  Plan: 1. Labs today:  CBC with diff,  CMP, CA27.29 2. Continue tamoxifen. 3. Encourage follow-up with Dr Bary Castilla. 4. Per patient, mammogram and ultrasound scheduled by Dr. Bary Castilla. 5. RTC in 6 months for MD assessment and labs (CBC, CMP, CA27.29)   Lequita Asal, MD  01/12/2015

## 2015-02-12 ENCOUNTER — Other Ambulatory Visit: Payer: Self-pay | Admitting: Hematology and Oncology

## 2015-05-12 IMAGING — MG MM MAMMO DIAGNOSTIC UNILATERAL*R*
1 series · 4 of 4 positions shown · non-contrast
Comparison: With priors

REASON FOR EXAM: RT BR CA FU
COMMENTS:

PROCEDURE:     MAM - MAM DGTL UNI MAM RT BREAST W/CAD  - December 23, 2012  [DATE]
CLINICAL DATA: 6-month follow-up of the right breast status post
lumpectomy for right breast cancer.  Lumpectomy is performed in
May 2012.
DIGITAL DIAGNOSTIC RIGHT MAMMOGRAM WITH CAD

[R CC · right · 4 of 4 slices shown]
[im 1/4]
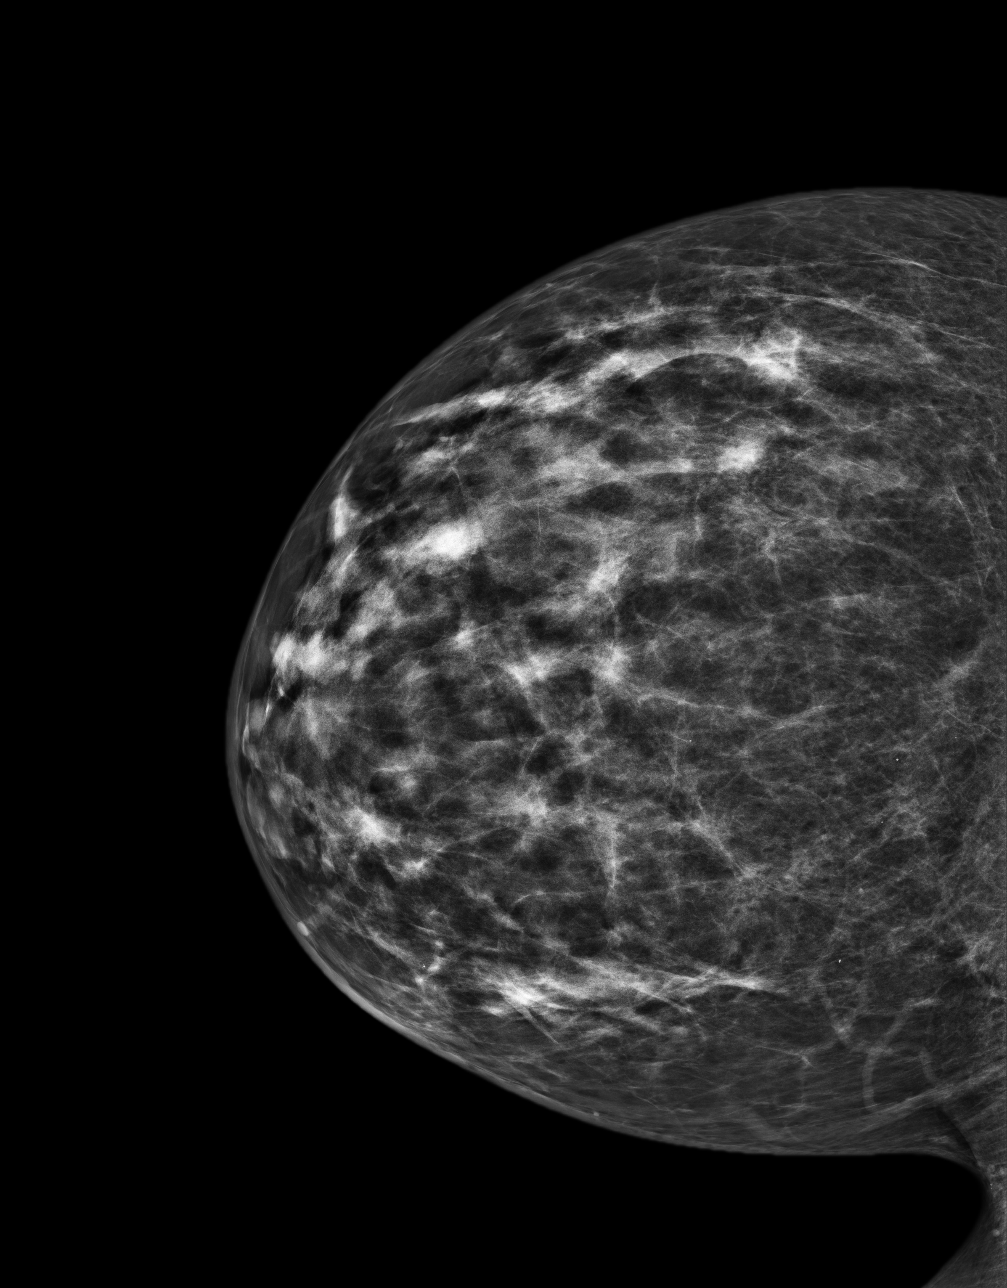
[im 2/4]
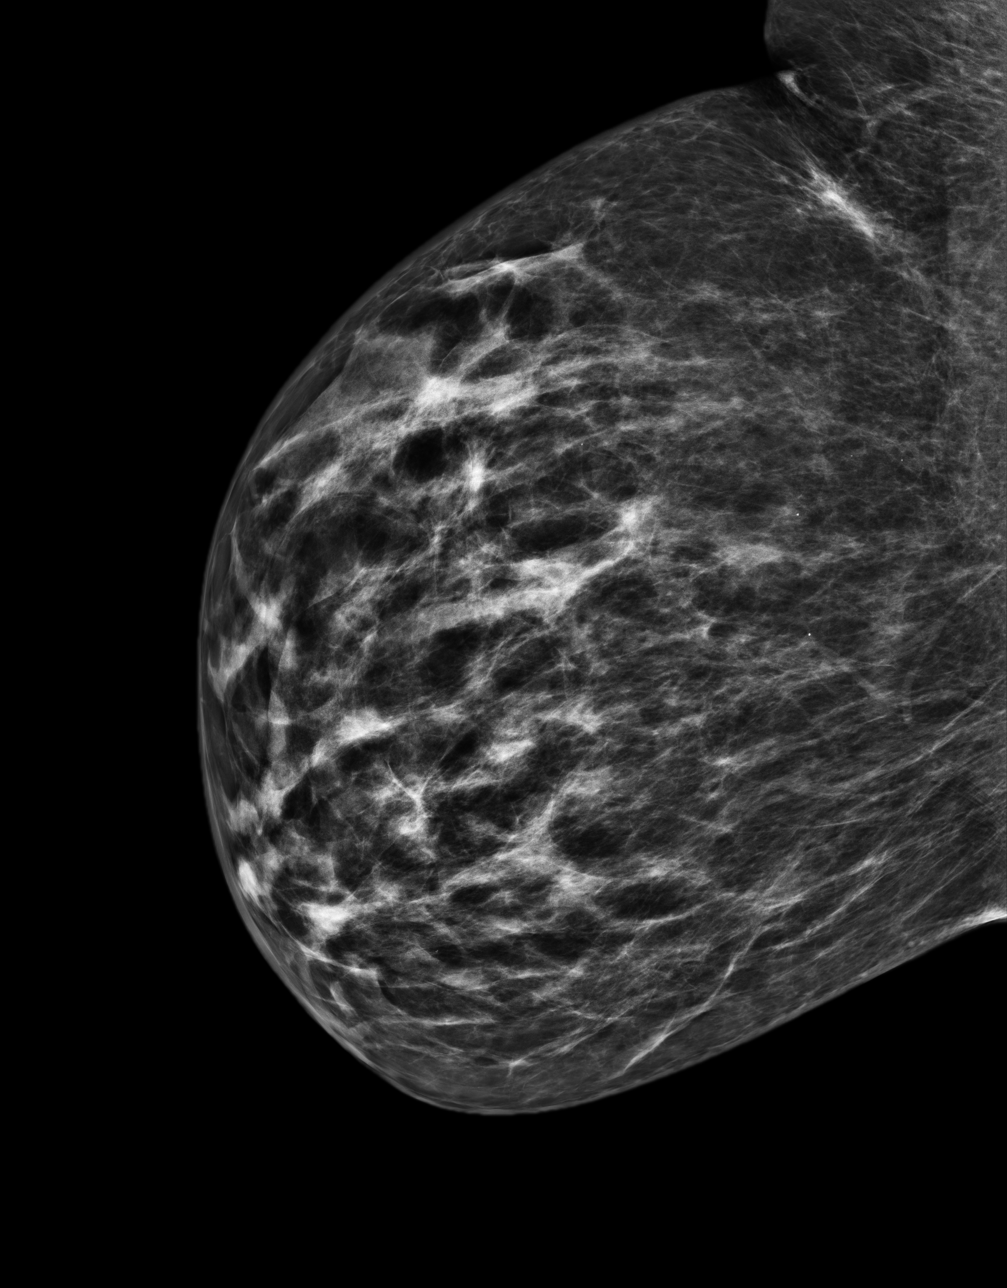
[im 3/4]
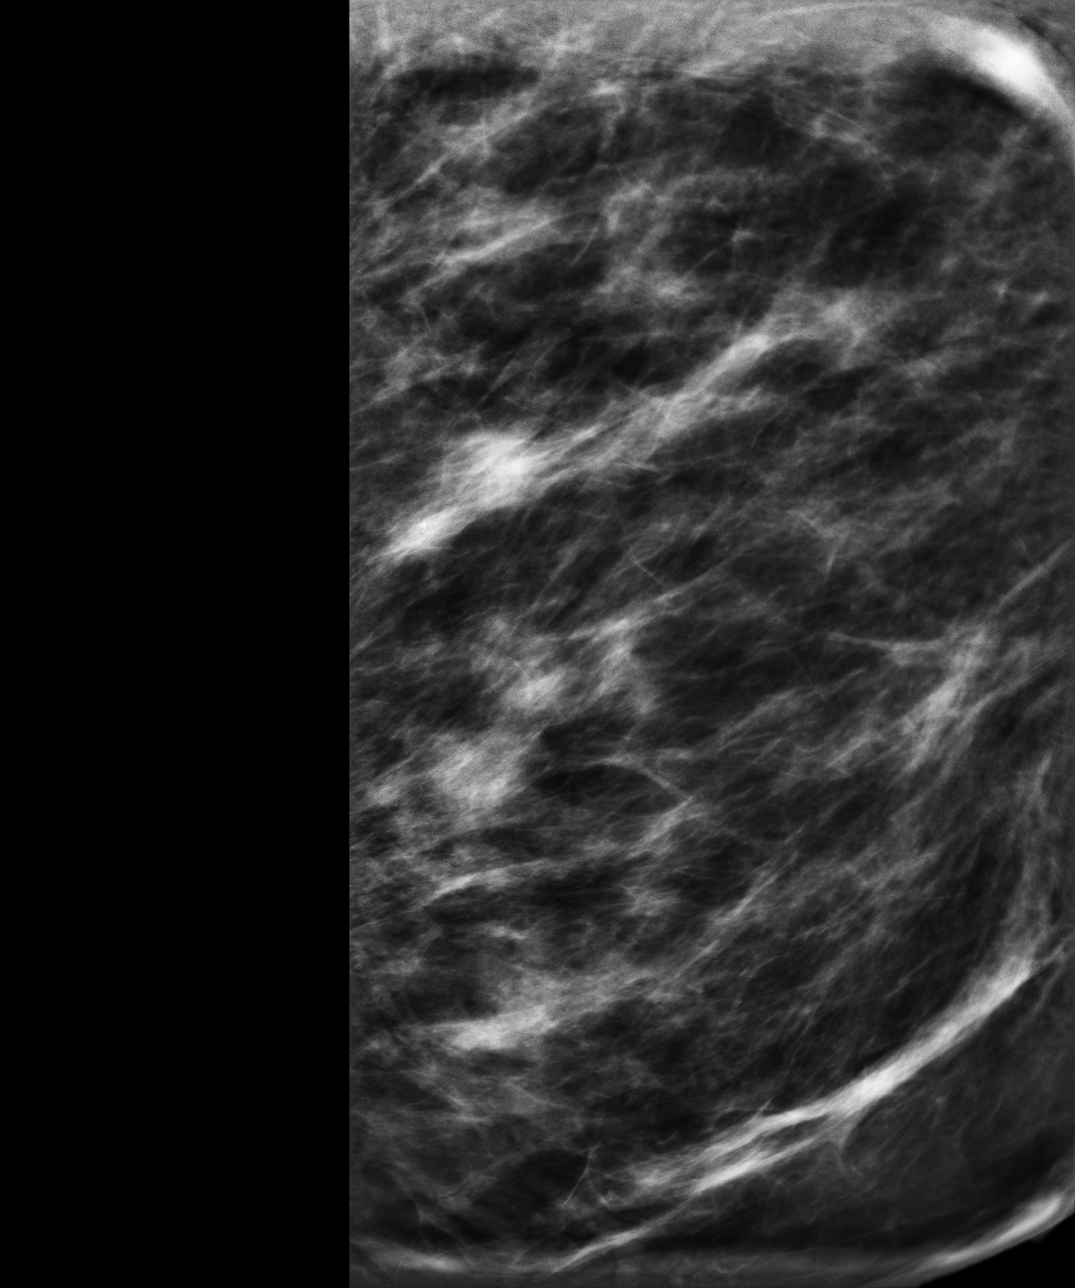
[im 4/4]
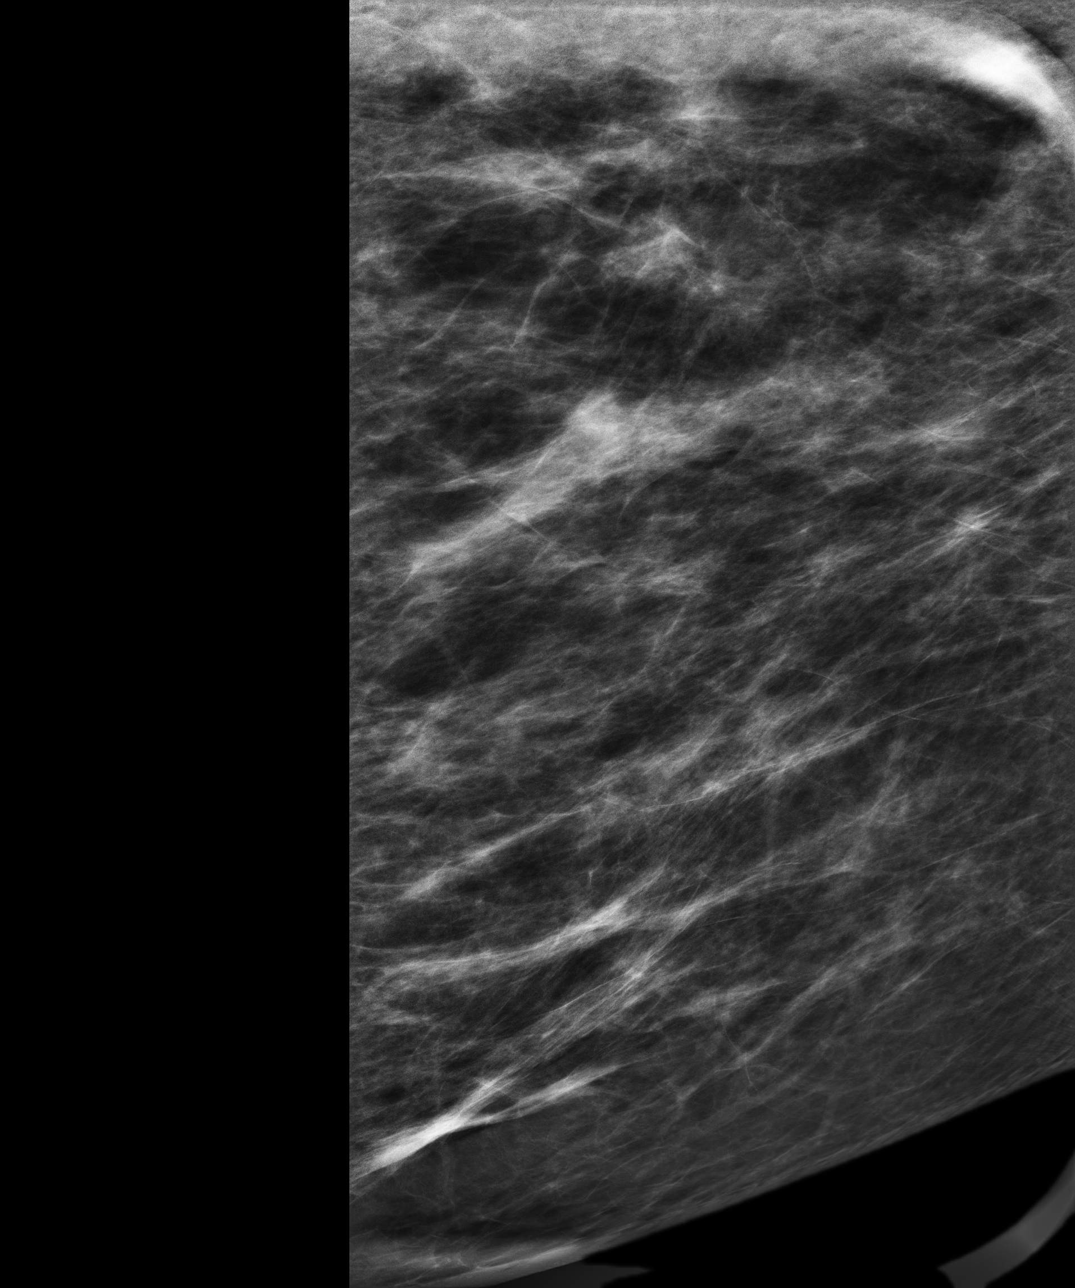

[4 of 4 positions shown; findings below may reference images not displayed]

FINDINGS: ACR Breast Density Category c:  The breast tissue is
heterogeneously dense, which may obscure small masses.

Lumpectomy changes are noted in the medial right breast.  No mass,
suspicious microcalcification, or nonsurgical distortion is
identified in the right breast to suggest malignancy.

Mammographic images were processed with CAD.
IMPRESSION: Lumpectomy changes of the right breast.  No evidence of malignancy.

RECOMMENDATION:
Bilateral diagnostic mammogram May 2013.

I have discussed the findings and recommendations with the patient.
Results were also provided in writing at the conclusion of the
visit.  If applicable, a reminder letter will be sent to the
patient regarding her next appointment.

BI-RADS CATEGORY 2:  Benign finding(s).

## 2015-06-04 ENCOUNTER — Other Ambulatory Visit: Payer: Self-pay | Admitting: Hematology and Oncology

## 2015-07-13 ENCOUNTER — Inpatient Hospital Stay: Payer: Managed Care, Other (non HMO)

## 2015-07-13 ENCOUNTER — Other Ambulatory Visit: Payer: Self-pay

## 2015-07-13 ENCOUNTER — Inpatient Hospital Stay: Payer: Managed Care, Other (non HMO) | Admitting: Hematology and Oncology

## 2015-07-13 DIAGNOSIS — C50911 Malignant neoplasm of unspecified site of right female breast: Secondary | ICD-10-CM

## 2015-07-27 ENCOUNTER — Inpatient Hospital Stay (HOSPITAL_BASED_OUTPATIENT_CLINIC_OR_DEPARTMENT_OTHER): Payer: Managed Care, Other (non HMO) | Admitting: Hematology and Oncology

## 2015-07-27 ENCOUNTER — Inpatient Hospital Stay: Payer: Managed Care, Other (non HMO) | Attending: Hematology and Oncology

## 2015-07-27 ENCOUNTER — Encounter: Payer: Self-pay | Admitting: Hematology and Oncology

## 2015-07-27 VITALS — BP 128/84 | HR 73 | Temp 96.3°F | Resp 18 | Ht 61.0 in | Wt 178.1 lb

## 2015-07-27 DIAGNOSIS — I10 Essential (primary) hypertension: Secondary | ICD-10-CM

## 2015-07-27 DIAGNOSIS — Z801 Family history of malignant neoplasm of trachea, bronchus and lung: Secondary | ICD-10-CM | POA: Insufficient documentation

## 2015-07-27 DIAGNOSIS — Z79810 Long term (current) use of selective estrogen receptor modulators (SERMs): Secondary | ICD-10-CM | POA: Insufficient documentation

## 2015-07-27 DIAGNOSIS — Z170 Estrogen receptor positive status [ER+]: Secondary | ICD-10-CM

## 2015-07-27 DIAGNOSIS — C50411 Malignant neoplasm of upper-outer quadrant of right female breast: Secondary | ICD-10-CM | POA: Insufficient documentation

## 2015-07-27 DIAGNOSIS — C50911 Malignant neoplasm of unspecified site of right female breast: Secondary | ICD-10-CM

## 2015-07-27 DIAGNOSIS — Z803 Family history of malignant neoplasm of breast: Secondary | ICD-10-CM

## 2015-07-27 DIAGNOSIS — I890 Lymphedema, not elsewhere classified: Secondary | ICD-10-CM | POA: Insufficient documentation

## 2015-07-27 LAB — CBC WITH DIFFERENTIAL/PLATELET
Basophils Absolute: 0 10*3/uL (ref 0–0.1)
Basophils Relative: 1 %
Eosinophils Absolute: 0 10*3/uL (ref 0–0.7)
Eosinophils Relative: 1 %
HCT: 39.7 % (ref 35.0–47.0)
Hemoglobin: 13.4 g/dL (ref 12.0–16.0)
Lymphocytes Relative: 20 %
Lymphs Abs: 1.5 10*3/uL (ref 1.0–3.6)
MCH: 29.6 pg (ref 26.0–34.0)
MCHC: 33.9 g/dL (ref 32.0–36.0)
MCV: 87.4 fL (ref 80.0–100.0)
Monocytes Absolute: 0.3 10*3/uL (ref 0.2–0.9)
Monocytes Relative: 4 %
Neutro Abs: 5.7 10*3/uL (ref 1.4–6.5)
Neutrophils Relative %: 74 %
Platelets: 194 10*3/uL (ref 150–440)
RBC: 4.54 MIL/uL (ref 3.80–5.20)
RDW: 13 % (ref 11.5–14.5)
WBC: 7.6 10*3/uL (ref 3.6–11.0)

## 2015-07-27 LAB — COMPREHENSIVE METABOLIC PANEL
ALT: 21 U/L (ref 14–54)
AST: 25 U/L (ref 15–41)
Albumin: 3.8 g/dL (ref 3.5–5.0)
Alkaline Phosphatase: 51 U/L (ref 38–126)
Anion gap: 3 — ABNORMAL LOW (ref 5–15)
BUN: 16 mg/dL (ref 6–20)
CO2: 25 mmol/L (ref 22–32)
Calcium: 8.7 mg/dL — ABNORMAL LOW (ref 8.9–10.3)
Chloride: 112 mmol/L — ABNORMAL HIGH (ref 101–111)
Creatinine, Ser: 0.84 mg/dL (ref 0.44–1.00)
GFR calc Af Amer: >60 mL/min (ref >60)
GFR calc non Af Amer: >60 mL/min (ref >60)
Glucose, Bld: 106 mg/dL — ABNORMAL HIGH (ref 65–99)
Potassium: 3.5 mmol/L (ref 3.5–5.1)
Sodium: 140 mmol/L (ref 135–145)
Total Bilirubin: 0.4 mg/dL (ref 0.3–1.2)
Total Protein: 7.4 g/dL (ref 6.5–8.1)

## 2015-07-27 NOTE — Progress Notes (Signed)
[facility] day:  07/27/2015  Chief Complaint: Natalie Gallagher is a 45 y.o. female with stage IA right breast cancer who is seen for 6 month assessment.  HPI: The patient was last seen in the medical oncology clinic on 01/12/2015.  At that time, she was seen for 6 month assessment.  Symptomatically, she was doing well.  She had right upper extremity lymphedema.  She had mild vasomotor symptoms.  Labs including a CBC with diff, CMP, and CA27.29 (17) were normal.  She has not had an interval mammogram.  During the interim, she has done well.  She is taking her tamoxifen.  She denies any breast concerns.  Past Medical History  Diagnosis Date  . Hypertension   . Cancer [facility]) 2014    right breast.right wide excision with sentinal node biopsy on 06/21/12  . Breast cancer of upper-outer quadrant of right female breast [facility]) March 2014    T1c,N0,M0; BRCA neg. ER: 60%; PR: 70%, her 2 neu not over expressed.     Past Surgical History  Procedure Laterality Date  . Breast surgery Right 06/21/2012    right wide excision with sentinal node biopsy/axilary dissection.    Family History  Problem Relation Age of Onset  . Cancer Paternal Uncle     Lung  . Cancer Cousin     Breast     Social History:  reports that she has never smoked. She does not have any smokeless tobacco history on file. She reports that she does not drink alcohol or use illicit drugs.  The patient is alone today.  Allergies:  Allergies  Allergen Reactions  . Tape     The patient showed a cutaneous reaction to Telfa applied beneath a Tegaderm dressing. Telfa appears to be the primary offensive material.    Current Medications: Current Outpatient Prescriptions  Medication Sig Dispense Refill  . Multiple Vitamins-Calcium (VIACTIV MULTI-VITAMIN PO) Take 2 tablets by mouth daily.    . tamoxifen (NOLVADEX) 20 MG tablet TAKE 1 TABLET BY MOUTH AT NIGHT AT BEDTIME 30 tablet 3   No  current facility-administered medications for this visit.    Review of Systems:  GENERAL:  Feels fine.  No fevers or sweats.  Weight up 8 pounds. PERFORMANCE STATUS (ECOG):  0 HEENT:  No visual changes, runny nose, sore throat, mouth sores or tenderness. Lungs: No shortness of breath or cough.  No hemoptysis. Cardiac:  No chest pain, palpitations, orthopnea, or PND. GI:  No nausea, vomiting, diarrhea, constipation, melena or hematochezia. GU:  No urgency, frequency, dysuria, or hematuria. Musculoskeletal:  No back pain.  No joint pain.  No muscle tenderness. Extremities:  No pain or swelling. Skin:  No rashes or skin changes. Neuro:  No headache, numbness or weakness, balance or coordination issues. Endocrine: Hot flashes.  No diabetes, thyroid issues, or night sweats. Psych:  No mood changes, depression or anxiety. Pain:  No focal pain. Review of systems:  All other systems reviewed and found to be negative.  Physical Exam: Blood pressure 128/84, pulse 73, temperature 96.3 F (35.7 C), temperature source Tympanic, resp. rate 18, height '5\' 1"'$  (1.549 m), weight 178 lb 2.1 oz (80.8 kg). GENERAL:  Well developed, well nourished, sitting comfortably in the exam room in no acute distress. MENTAL STATUS:  Alert and oriented to person, place and time. HEAD:  Curly black hair.  Normocephalic, atraumatic, face symmetric, no Cushingoid features. EYES:  Brown eyes.  Pupils equal round and  reactive to light and accomodation.  No conjunctivitis or scleral icterus. ENT:  Oropharynx clear without lesion.  Tongue normal. Mucous membranes moist.  RESPIRATORY:  Clear to auscultation without rales, wheezes or rhonchi. CARDIOVASCULAR:  Regular rate and rhythm without murmur, rub or gallop. BREAST:  Large breast.  Right breast with moderate inferior quadrant post-operative and post radiation changes.  Significant fibrocystic changes left breast.  No discrete masses, skin changes or nipple discharge.    ABDOMEN:  Soft, non-tender, with active bowel sounds, and no appreciable hepatosplenomegaly.  No masses. SKIN:  No rashes, ulcers or lesions. EXTREMITIES: Lymphedema right upper extremity.  No edema, no skin discoloration or tenderness.  No palpable cords. LYMPH NODES: No palpable cervical, supraclavicular, axillary or inguinal adenopathy  NEUROLOGICAL: Unremarkable. PSYCH:  Appropriate.  Appointment on 07/27/2015  Component Date Value Ref Range Status  . WBC 07/27/2015 7.6  3.6 - 11.0 K/uL Final  . RBC 07/27/2015 4.54  3.80 - 5.20 MIL/uL Final  . Hemoglobin 07/27/2015 13.4  12.0 - 16.0 g/dL Final  . HCT 81/27/5170 39.7  35.0 - 47.0 % Final  . MCV 07/27/2015 87.4  80.0 - 100.0 fL Final  . MCH 07/27/2015 29.6  26.0 - 34.0 pg Final  . MCHC 07/27/2015 33.9  32.0 - 36.0 g/dL Final  . RDW 01/74/9449 13.0  11.5 - 14.5 % Final  . Platelets 07/27/2015 194  150 - 440 K/uL Final  . Neutrophils Relative % 07/27/2015 74   Final  . Neutro Abs 07/27/2015 5.7  1.4 - 6.5 K/uL Final  . Lymphocytes Relative 07/27/2015 20   Final  . Lymphs Abs 07/27/2015 1.5  1.0 - 3.6 K/uL Final  . Monocytes Relative 07/27/2015 4   Final  . Monocytes Absolute 07/27/2015 0.3  0.2 - 0.9 K/uL Final  . Eosinophils Relative 07/27/2015 1   Final  . Eosinophils Absolute 07/27/2015 0.0  0 - 0.7 K/uL Final  . Basophils Relative 07/27/2015 1   Final  . Basophils Absolute 07/27/2015 0.0  0 - 0.1 K/uL Final  . Sodium 07/27/2015 140  135 - 145 mmol/L Final  . Potassium 07/27/2015 3.5  3.5 - 5.1 mmol/L Final  . Chloride 07/27/2015 112* 101 - 111 mmol/L Final  . CO2 07/27/2015 25  22 - 32 mmol/L Final  . Glucose, Bld 07/27/2015 106* 65 - 99 mg/dL Final  . BUN 67/59/1638 16  6 - 20 mg/dL Final  . Creatinine, Ser 07/27/2015 0.84  0.44 - 1.00 mg/dL Final  . Calcium 46/65/9935 8.7* 8.9 - 10.3 mg/dL Final  . Total Protein 07/27/2015 7.4  6.5 - 8.1 g/dL Final  . Albumin 70/17/7939 3.8  3.5 - 5.0 g/dL Final  . AST 03/00/9233 25  15  - 41 U/L Final  . ALT 07/27/2015 21  14 - 54 U/L Final  . Alkaline Phosphatase 07/27/2015 51  38 - 126 U/L Final  . Total Bilirubin 07/27/2015 0.4  0.3 - 1.2 mg/dL Final  . GFR calc non Af Amer 07/27/2015 >60  >60 mL/min Final  . GFR calc Af Amer 07/27/2015 >60  >60 mL/min Final   Comment: (NOTE) The eGFR has been calculated using the CKD EPI equation. This calculation has not been validated in all clinical situations. eGFR's persistently <60 mL/min signify possible Chronic Kidney Disease.   Eustaquio Boyden gap 07/27/2015 3* 5 - 15 Final    Assessment:  Loleta Frommelt is a 45 y.o. female with a history of stage IA invasive mammary carcinoma right breast status  post wide excision and axillary lymph node dissection on 06/21/2012.  Pathology revealed a 1.7 cm grade III ductal carcinoma.  Thirteen lymph nodes were negative.  Tumor was ER/PR positive and HER-2/neu negative. BRCA 1 and 2 testing was negative.  Oncotype DX was 29 (high) which translates into a distant recurrence of 19% at 10 yrs with tamoxifen alone.  The patient received 4 cycles of adriamycin and Cytoxan (08/17/2012 - 10/19/2012) followed by 12 weeks of Taxol (11/19/2012 - 01/25/2013).  She completed local radiation on 04/20/2013.  She has been on tamoxifen since 04/21/2013.  CA27.29 was 17 on 01/12/2015 and 18.6 on 07/27/2015.  Mammogram on 07/07/2013 was benign.  Mammogram and ultrasound on 07/07/2014 revealed slight lumpectomy changes of the posterior third of the inner lower right breast. There was a 4 cm circumscribed partially visualized oval mass deep along the left chest wall along the lower central right breast just deep to the lumpectomy.  Symptomatically, she is doing well.  She has mild vasomotor symptoms.  Exam reveals moderate fibrocystic disease.  Plan: 1.  Labs today:  CBC with diff, CMP, CA27.29 2.  Continue tamoxifen. 3.  Schedule mammogram on 08/09/2015. 4.  Contact Dr. Dwyane Luo office during clinic-done.  Appointment  with Dr. Bary Castilla to be scheduled by his staff after her mammogram.  5.  RTC in 6 months for MD assessment , labs (CBC, CMP, CA27.29), and review of mammogram.   Lequita Asal, MD  07/27/2015, 11:21 AM

## 2015-07-27 NOTE — Progress Notes (Signed)
No concerns today and no changes

## 2015-07-27 NOTE — Progress Notes (Signed)
Called [facility] office and spoke to carolyn and she states for our office to order the mammogram and it was done. It will be 5/4 and I called back and left message with her staff member Janett Billow and she will pass message along to carolyn so she can call pt and let her know so she will schedule an appt with by[facility].

## 2015-07-28 ENCOUNTER — Encounter: Payer: Self-pay | Admitting: Hematology and Oncology

## 2015-07-28 LAB — CANCER ANTIGEN 27.29: CA 27.29: 18.6 U/mL (ref 0.0–38.6)

## 2015-07-31 ENCOUNTER — Encounter: Payer: Self-pay | Admitting: Obstetrics and Gynecology

## 2015-08-09 ENCOUNTER — Ambulatory Visit
Admission: RE | Admit: 2015-08-09 | Discharge: 2015-08-09 | Disposition: A | Discharge status: Home / Self Care | Payer: Managed Care, Other (non HMO) | Source: Ambulatory Visit | Attending: Hematology and Oncology | Admitting: Hematology and Oncology

## 2015-08-09 DIAGNOSIS — C50911 Malignant neoplasm of unspecified site of right female breast: Secondary | ICD-10-CM

## 2015-08-09 DIAGNOSIS — Z853 Personal history of malignant neoplasm of breast: Secondary | ICD-10-CM | POA: Insufficient documentation

## 2015-08-09 HISTORY — DX: Malignant neoplasm of unspecified site of unspecified female breast: C50.919

## 2015-08-23 ENCOUNTER — Encounter: Payer: Self-pay | Admitting: General Surgery

## 2015-08-23 ENCOUNTER — Ambulatory Visit (INDEPENDENT_AMBULATORY_CARE_PROVIDER_SITE_OTHER): Payer: Managed Care, Other (non HMO) | Admitting: General Surgery

## 2015-08-23 VITALS — BP 128/68 | HR 76 | Resp 12 | Ht 61.0 in | Wt 188.0 lb

## 2015-08-23 DIAGNOSIS — C50411 Malignant neoplasm of upper-outer quadrant of right female breast: Secondary | ICD-10-CM | POA: Diagnosis not present

## 2015-08-23 NOTE — Progress Notes (Signed)
Patient ID: Natalie Gallagher, female   DOB: 05/16/1970, 45 y.o.   MRN: [id number]  Chief Complaint  Patient presents with  . Follow-up    mammogram    HPI Natalie Gallagher is a 45 y.o. female who presents for a breast evaluation. The most recent mammogram requested by Nolon Stalls, M.D. was done on 08/09/15 at [facility].  Patient does perform regular self breast checks and gets regular mammograms done. Patient reports no new breast issues. She reports still taking the tamoxifen and tolerating it well.  I personally reviewed the patient's history.  HPI  Past Medical History  Diagnosis Date  . Hypertension   . Cancer [facility]) 2014    right breast.right wide excision with sentinal node biopsy on 06/21/12  . Breast cancer of upper-outer quadrant of right female breast [facility]) March 2014    T1c,N0,M0; BRCA neg. ER: 60%; PR: 70%, her 2 neu not over expressed.   . Breast cancer ([place]) 2014    Chemo/radiaiton- Rt.    Past Surgical History  Procedure Laterality Date  . Breast surgery Right 06/21/2012    right wide excision with sentinal node biopsy/axilary dissection.  . Breast biopsy Right 2014    Positive    Family History  Problem Relation Age of Onset  . Cancer Paternal Uncle     Lung  . Cancer Cousin     Breast   . Cancer Maternal Aunt     lung    Social History Social History  Substance Use Topics  . Smoking status: Never Smoker   . Smokeless tobacco: None  . Alcohol Use: No    Allergies  Allergen Reactions  . Tape     The patient showed a cutaneous reaction to Telfa applied beneath a Tegaderm dressing. Telfa appears to be the primary offensive material.    Current Outpatient Prescriptions  Medication Sig Dispense Refill  . Multiple Vitamins-Calcium (VIACTIV MULTI-VITAMIN PO) Take 2 tablets by mouth daily.    . tamoxifen (NOLVADEX) 20 MG tablet TAKE 1 TABLET BY MOUTH AT NIGHT AT BEDTIME 30 tablet 3   No current facility-administered medications for this visit.    Review of  Systems Review of Systems  Constitutional: Negative.   Respiratory: Negative.   Cardiovascular: Negative.     Blood pressure 128/68, pulse 76, resp. rate 12, height 5' 1" (1.549 m), weight 188 lb (85.276 kg).  Physical Exam Physical Exam  Constitutional: She is oriented to person, place, and time. She appears well-developed and well-nourished.  Eyes: Conjunctivae are normal. No scleral icterus.  Neck: Neck supple. No thyromegaly present.  Cardiovascular: Normal rate, regular rhythm and normal heart sounds.   Pulmonary/Chest: Effort normal and breath sounds normal. Right breast exhibits no inverted nipple, no mass, no nipple discharge, no skin change and no tenderness. Left breast exhibits no inverted nipple, no mass, no nipple discharge, no skin change and no tenderness.    Well healed incision right breast at 3 o'clock Mild skin edema  Lymphadenopathy:    She has no cervical adenopathy.    She has no axillary adenopathy.  Neurological: She is alert and oriented to person, place, and time.  Skin: Skin is warm and dry.    Data Reviewed 08/09/2015 mammogram reviewed. No interval change. BI-RADS-2.  Assessment    Benign breast exam.  Candidate for screening colonoscopy.    Plan    Marek in side a gastroenterology recommends screening at age 69 for African-Americans. Patient was not directly happy to hear this. She  is at slightly increased risk for colon cancer based on her previous diagnosis of breast cancer. She'll consider her options, and at a minimum will review the recommendation at her follow-up next year.  Colonoscopy with possible biopsy/polypectomy prn: Information regarding the procedure, including its potential risks and complications (including but not limited to perforation of the bowel, which may require emergency surgery to repair, and bleeding) was verbally given to the patient. Educational information regarding lower intestinal endoscopy was given to the  patient. Written instructions for how to complete the bowel prep using Miralax were provided. The importance of drinking ample fluids to avoid dehydration as a result of the prep emphasized.  Patient will be asked to return to the office in one year with a bilateral diagnostic mammogram to be arranged by her medical oncologist.  Plan to schedule colonoscopy in the next year.       This information has been scribed by Verlene Mayer, CMA    PCP: None   Robert Bellow 08/24/2015, 7:21 AM

## 2015-08-23 NOTE — Patient Instructions (Addendum)
Continue self breast exams. Call office for any new breast issues or concerns. Colonoscopy A colonoscopy is an exam to look at the entire large intestine (colon). This exam can help find problems such as tumors, polyps, inflammation, and areas of bleeding. The exam takes about 1 hour.  LET [facility] CARE PROVIDER KNOW ABOUT:   Any allergies you have.  All medicines you are taking, including vitamins, herbs, eye drops, creams, and over-the-counter medicines.  Previous problems you or members of your family have had with the use of anesthetics.  Any blood disorders you have.  Previous surgeries you have had.  Medical conditions you have. RISKS AND COMPLICATIONS  Generally, this is a safe procedure. However, as with any procedure, complications can occur. Possible complications include:  Bleeding.  Tearing or rupture of the colon wall.  Reaction to medicines given during the exam.  Infection (rare). BEFORE THE PROCEDURE   Ask your health care provider about changing or stopping your regular medicines.  You may be prescribed an oral bowel prep. This involves drinking a large amount of medicated liquid, starting the day before your procedure. The liquid will cause you to have multiple loose stools until your stool is almost clear or light green. This cleans out your colon in preparation for the procedure.  Do not eat or drink anything else once you have started the bowel prep, unless your health care provider tells you it is safe to do so.  Arrange for someone to drive you home after the procedure. PROCEDURE   You will be given medicine to help you relax (sedative).  You will lie on your side with your knees bent.  A long, flexible tube with a light and camera on the end (colonoscope) will be inserted through the rectum and into the colon. The camera sends video back to a computer screen as it moves through the colon. The colonoscope also releases carbon dioxide gas to inflate  the colon. This helps your health care provider see the area better.  During the exam, your health care provider may take a small tissue sample (biopsy) to be examined under a microscope if any abnormalities are found.  The exam is finished when the entire colon has been viewed. AFTER THE PROCEDURE   Do not drive for 24 hours after the exam.  You may have a small amount of blood in your stool.  You may pass moderate amounts of gas and have mild abdominal cramping or bloating. This is caused by the gas used to inflate your colon during the exam.  Ask when your test results will be ready and how you will get your results. Make sure you get your test results.   This information is not intended to replace advice given to you by your health care provider. Make sure you discuss any questions you have with your health care provider.   Document Released: 03/21/2000 Document Revised: 01/12/2013 Document Reviewed: 11/29/2012 Elsevier Interactive Patient Education [employer].

## 2015-09-19 ENCOUNTER — Encounter: Payer: Self-pay | Admitting: Obstetrics and Gynecology

## 2015-09-27 ENCOUNTER — Ambulatory Visit (INDEPENDENT_AMBULATORY_CARE_PROVIDER_SITE_OTHER): Payer: Managed Care, Other (non HMO) | Admitting: Obstetrics and Gynecology

## 2015-09-27 ENCOUNTER — Encounter: Payer: Self-pay | Admitting: Obstetrics and Gynecology

## 2015-09-27 VITALS — BP 118/78 | HR 85 | Ht 61.0 in | Wt 183.2 lb

## 2015-09-27 DIAGNOSIS — Z01419 Encounter for gynecological examination (general) (routine) without abnormal findings: Secondary | ICD-10-CM | POA: Diagnosis not present

## 2015-09-27 NOTE — Progress Notes (Signed)
Subjective:   Natalie Gallagher is a 45 y.o. G0P0 African American female here for a routine well-woman exam.  No LMP recorded. Patient is not currently having periods (Reason: Chemotherapy).    Current complaints: difficulty losing weight PCP: none       Does need desire labs  Social History: Sexual: heterosexual Marital Status: single Living situation: alone Occupation: unknown occupation Tobacco/alcohol: no tobacco use Illicit drugs: no history of illicit drug use  The following portions of the patient's history were reviewed and updated as appropriate: allergies, current medications, past family history, past medical history, past social history, past surgical history and problem list.  Past Medical History Past Medical History  Diagnosis Date  . Hypertension   . Cancer [facility]) 2014    right breast.right wide excision with sentinal node biopsy on 06/21/12  . Breast cancer of upper-outer quadrant of right female breast [facility]) March 2014    T1c,N0,M0; BRCA neg. ER: 60%; PR: 70%, her 2 neu not over expressed.   . Breast cancer ([place]) 2014    Chemo/radiaiton- Rt.    Past Surgical History Past Surgical History  Procedure Laterality Date  . Breast surgery Right 06/21/2012    right wide excision with sentinal node biopsy/axilary dissection.  . Breast biopsy Right 2014    Positive    Gynecologic History G0P0  No LMP recorded. Patient is not currently having periods (Reason: Chemotherapy). Contraception: abstinence Last Pap: 2015. Results were: normal Last mammogram: 2017. Results were: normal  Obstetric History OB History  Gravida Para Term Preterm AB SAB TAB Ectopic Multiple Living  0               Obstetric Comments  Age first period 79    Current Medications Current Outpatient Prescriptions on File Prior to Visit  Medication Sig Dispense Refill  . Multiple Vitamins-Calcium (VIACTIV MULTI-VITAMIN PO) Take 2 tablets by mouth daily.    . tamoxifen (NOLVADEX) 20 MG tablet  TAKE 1 TABLET BY MOUTH AT NIGHT AT BEDTIME 30 tablet 3   No current facility-administered medications on file prior to visit.    Review of Systems Patient denies any headaches, blurred vision, shortness of breath, chest pain, abdominal pain, problems with bowel movements, urination, or intercourse.  Objective:  BP 118/78 mmHg  Pulse 85  Ht '5\' 1"'$  (1.549 m)  Wt 183 lb 3.2 oz (83.099 kg)  BMI 34.63 kg/m2 Physical Exam  General:  Well developed, well nourished, no acute distress. She is alert and oriented x3. Skin:  Warm and dry Neck:  Midline trachea, no thyromegaly or nodules Cardiovascular: Regular rate and rhythm, no murmur heard Lungs:  Effort normal, all lung fields clear to auscultation bilaterally Breasts:  No dominant palpable mass, retraction, or nipple discharge Abdomen:  Soft, non tender, no hepatosplenomegaly or masses Pelvic:  External genitalia is normal in appearance.  The vagina is normal in appearance. The cervix is bulbous, no CMT.  Thin prep pap is not done . Uterus is felt to be normal size, shape, and contour.  No adnexal masses or tenderness noted. Extremities:  No swelling or varicosities noted Psych:  She has a normal mood and affect  Assessment:   Healthy well-woman exam Obesity S/p breast cancer  Plan:  Labs obtained  F/U 1 year  for AE, or sooner if needed  Melody Rockney Ghee, CNM

## 2015-09-27 NOTE — Patient Instructions (Signed)
Place annual gynecologic exam patient instructions here.

## 2015-10-01 ENCOUNTER — Other Ambulatory Visit: Payer: Self-pay | Admitting: Hematology and Oncology

## 2015-12-21 ENCOUNTER — Telehealth: Payer: Self-pay | Admitting: *Deleted

## 2015-12-21 NOTE — Telephone Encounter (Signed)
Message left regarding appt change from 12/28/15 to 01/01/16 @ 11am.

## 2015-12-28 ENCOUNTER — Ambulatory Visit: Payer: Managed Care, Other (non HMO) | Admitting: Radiation Oncology

## 2016-01-01 ENCOUNTER — Ambulatory Visit
Admission: RE | Admit: 2016-01-01 | Discharge: 2016-01-01 | Disposition: A | Discharge status: Home / Self Care | Payer: Managed Care, Other (non HMO) | Source: Ambulatory Visit | Attending: Radiation Oncology | Admitting: Radiation Oncology

## 2016-01-01 ENCOUNTER — Encounter: Payer: Self-pay | Admitting: Radiation Oncology

## 2016-01-01 VITALS — BP 149/97 | HR 72 | Temp 96.0°F | Wt 182.5 lb

## 2016-01-01 DIAGNOSIS — Z79810 Long term (current) use of selective estrogen receptor modulators (SERMs): Secondary | ICD-10-CM | POA: Insufficient documentation

## 2016-01-01 DIAGNOSIS — C50911 Malignant neoplasm of unspecified site of right female breast: Secondary | ICD-10-CM | POA: Insufficient documentation

## 2016-01-01 DIAGNOSIS — Z923 Personal history of irradiation: Secondary | ICD-10-CM | POA: Diagnosis not present

## 2016-01-01 DIAGNOSIS — Z170 Estrogen receptor positive status [ER+]: Secondary | ICD-10-CM | POA: Insufficient documentation

## 2016-01-01 NOTE — Progress Notes (Signed)
Radiation Oncology Follow up Note  Name: Natalie Gallagher   Date:   01/01/2016 MRN:  [id number] DOB: 11/03/1970    This 45 y.o. female presents to the clinic today for 2 half year follow-up for stage I invasive mammary carcinoma status post whole breast radiation.  REFERRING PROVIDER: No ref. provider found  HPI: Patient is a 45 year old female now out 2 half years having completed whole breast radiation to her right breast for stage IA (T1 CN 0 M0) invasive mammary carcinoma ER/PR positive HER-2/neu not overexpressed seen today in routine follow-up she is doing well. She did receive adjuvant chemotherapy currently on. Tamoxifen tolerating that well. Her last mammogram in May 2017 with BI-RADS 2 benign. She specifically denies breast tenderness cough or bone pain. She one time was wearing a sleeve for lymphedema has no evidence of lymphedema in her right upper extremity is not wearing a sleeve today.  COMPLICATIONS OF TREATMENT: none  FOLLOW UP COMPLIANCE: keeps appointments   PHYSICAL EXAM:  BP (!) 149/97   Pulse 72   Temp (!) 96 F (35.6 C)   Wt 182 lb 8.7 oz (82.8 kg)   BMI 34.49 kg/m  Lungs are clear to A&P cardiac examination essentially unremarkable with regular rate and rhythm. No dominant mass or nodularity is noted in either breast in 2 positions examined. Incision is well-healed. No axillary or supraclavicular adenopathy is appreciated. Cosmetic result is excellent. Well-developed well-nourished patient in NAD. HEENT reveals PERLA, EOMI, discs not visualized.  Oral cavity is clear. No oral mucosal lesions are identified. Neck is clear without evidence of cervical or supraclavicular adenopathy. Lungs are clear to A&P. Cardiac examination is essentially unremarkable with regular rate and rhythm without murmur rub or thrill. Abdomen is benign with no organomegaly or masses noted. Motor sensory and DTR levels are equal and symmetric in the upper and lower extremities. Cranial nerves II  through XII are grossly intact. Proprioception is intact. No peripheral adenopathy or edema is identified. No motor or sensory levels are noted. Crude visual fields are within normal range.  RADIOLOGY RESULTS: Most recent mammograms are reviewed and compatible with the above-stated findings  PLAN: At the present time she is doing well with no evidence of disease 2 half years out. I've asked to see her back in 1 year for follow-up. She continues on tamoxifen without side effect. Follow-up mammograms have ordered been ordered.  I would like to take this opportunity to thank you for allowing me to participate in the care of your patient.Armstead Peaks., MD

## 2016-01-22 ENCOUNTER — Other Ambulatory Visit: Payer: Self-pay | Admitting: *Deleted

## 2016-01-22 DIAGNOSIS — Z853 Personal history of malignant neoplasm of breast: Secondary | ICD-10-CM

## 2016-01-27 NOTE — Progress Notes (Signed)
[facility] day:  01/28/16  Chief Complaint: Natalie Gallagher is a 45 y.o. female with stage IA right breast cancer who is seen for 6 month assessment.  HPI: The patient was last seen in the medical oncology clinic on 07/27/2015.  At that time, she was doing well on tamoxifen.  She denied any breast concerns.  Labs including a CBC with diff, CMP, and CA27.29 (18.6) were normal.  Bilateral mammogram on 08/09/2015 revealed post lumpectomy changes in the right breast. There were no new or suspicious abnormalities within either breast.  During the interim, she has done well.  She is taking her tamoxifen.  She denies any breast concerns.  She is going to the gym.   Past Medical History:  Diagnosis Date  . Breast cancer ([place]) 2014   Chemo/radiaiton- Rt.  . Breast cancer of upper-outer quadrant of right female breast [facility]) March 2014   T1c,N0,M0; BRCA neg. ER: 60%; PR: 70%, her 2 neu not over expressed.   . Cancer [facility]) 2014   right breast.right wide excision with sentinal node biopsy on 06/21/12  . Hypertension     Past Surgical History:  Procedure Laterality Date  . BREAST BIOPSY Right 2014   Positive  . BREAST SURGERY Right 06/21/2012   right wide excision with sentinal node biopsy/axilary dissection.    Family History  Problem Relation Age of Onset  . Cancer Paternal Uncle     Lung  . Cancer Cousin     Breast   . Cancer Maternal Aunt     lung    Social History:  reports that she has never smoked. She has never used smokeless tobacco. She reports that she does not drink alcohol or use drugs.  She is going to the gym.  She lives in [place].  The patient is alone today.  Allergies:  Allergies  Allergen Reactions  . Tape     The patient showed a cutaneous reaction to Telfa applied beneath a Tegaderm dressing. Telfa appears to be the primary offensive material.    Current Medications: Current Outpatient Prescriptions  Medication  Sig Dispense Refill  . Multiple Vitamins-Calcium (VIACTIV MULTI-VITAMIN PO) Take 2 tablets by mouth daily.    . tamoxifen (NOLVADEX) 20 MG tablet TAKE 1 TABLET BY MOUTH AT NIGHT AT BEDTIME 30 tablet 3   No current facility-administered medications for this visit.     Review of Systems:  GENERAL:  Feels good.  No fevers or sweats.  Weight up 5 pounds. PERFORMANCE STATUS (ECOG):  0 HEENT:  No visual changes, runny nose, sore throat, mouth sores or tenderness. Lungs: No shortness of breath or cough.  No hemoptysis. Cardiac:  No chest pain, palpitations, orthopnea, or PND. GI:  No nausea, vomiting, diarrhea, constipation, melena or hematochezia. GU:  No urgency, frequency, dysuria, or hematuria. Musculoskeletal:  No back pain.  No joint pain.  No muscle tenderness. Extremities:  No pain or swelling. Skin:  No rashes or skin changes. Neuro:  No headache, numbness or weakness, balance or coordination issues. Endocrine: Hot flashes.  No diabetes, thyroid issues, or night sweats. Psych:  No mood changes, depression or anxiety. Pain:  No focal pain. Review of systems:  All other systems reviewed and found to be negative.  Physical Exam: Blood pressure 129/89, pulse 76, temperature (!) 95.6 F (35.3 C), weight 183 lb 3.2 oz (83.1 kg). GENERAL:  Well developed, well nourished, sitting comfortably in the exam room in no acute distress.  MENTAL STATUS:  Alert and oriented to person, place and time. HEAD:  Curly black hair.  Normocephalic, atraumatic, face symmetric, no Cushingoid features. EYES:  Brown eyes.  Pupils equal round and reactive to light and accomodation.  No conjunctivitis or scleral icterus. ENT:  Oropharynx clear without lesion.  Tongue normal. Mucous membranes moist.  RESPIRATORY:  Clear to auscultation without rales, wheezes or rhonchi. CARDIOVASCULAR:  Regular rate and rhythm without murmur, rub or gallop. BREAST:  Large breasts.  Right breast with moderate inferior quadrant  post-operative and post radiation changes.  Significant fibrocystic changes left breast.  No discrete masses, skin changes or nipple discharge.   ABDOMEN:  Soft, non-tender, with active bowel sounds, and no appreciable hepatosplenomegaly.  No masses. SKIN:  No rashes, ulcers or lesions. EXTREMITIES: Lymphedema right upper extremity.  No edema, no skin discoloration or tenderness.  No palpable cords. LYMPH NODES: No palpable cervical, supraclavicular, axillary or inguinal adenopathy  NEUROLOGICAL: Unremarkable. PSYCH:  Appropriate.   Appointment on 01/28/2016  Component Date Value Ref Range Status  . WBC 01/28/2016 6.1  3.6 - 11.0 K/uL Final  . RBC 01/28/2016 4.58  3.80 - 5.20 MIL/uL Final  . Hemoglobin 01/28/2016 13.4  12.0 - 16.0 g/dL Final  . HCT 01/28/2016 39.3  35.0 - 47.0 % Final  . MCV 01/28/2016 85.8  80.0 - 100.0 fL Final  . MCH 01/28/2016 29.2  26.0 - 34.0 pg Final  . MCHC 01/28/2016 34.1  32.0 - 36.0 g/dL Final  . RDW 01/28/2016 13.1  11.5 - 14.5 % Final  . Platelets 01/28/2016 209  150 - 440 K/uL Final  . Neutrophils Relative % 01/28/2016 56  % Final  . Neutro Abs 01/28/2016 3.4  1.4 - 6.5 K/uL Final  . Lymphocytes Relative 01/28/2016 34  % Final  . Lymphs Abs 01/28/2016 2.1  1.0 - 3.6 K/uL Final  . Monocytes Relative 01/28/2016 8  % Final  . Monocytes Absolute 01/28/2016 0.5  0.2 - 0.9 K/uL Final  . Eosinophils Relative 01/28/2016 3  % Final  . Eosinophils Absolute 01/28/2016 0.2  0 - 0.7 K/uL Final  . Basophils Relative 01/28/2016 1  % Final  . Basophils Absolute 01/28/2016 0.0  0 - 0.1 K/uL Final  . Sodium 01/28/2016 138  135 - 145 mmol/L Final  . Potassium 01/28/2016 3.7  3.5 - 5.1 mmol/L Final  . Chloride 01/28/2016 104  101 - 111 mmol/L Final  . CO2 01/28/2016 26  22 - 32 mmol/L Final  . Glucose, Bld 01/28/2016 84  65 - 99 mg/dL Final  . BUN 01/28/2016 14  6 - 20 mg/dL Final  . Creatinine, Ser 01/28/2016 0.82  0.44 - 1.00 mg/dL Final  . Calcium 01/28/2016 8.9   8.9 - 10.3 mg/dL Final  . Total Protein 01/28/2016 7.1  6.5 - 8.1 g/dL Final  . Albumin 01/28/2016 3.7  3.5 - 5.0 g/dL Final  . AST 01/28/2016 17  15 - 41 U/L Final  . ALT 01/28/2016 17  14 - 54 U/L Final  . Alkaline Phosphatase 01/28/2016 49  38 - 126 U/L Final  . Total Bilirubin 01/28/2016 0.4  0.3 - 1.2 mg/dL Final  . GFR calc non Af Amer 01/28/2016 >60  >60 mL/min Final  . GFR calc Af Amer 01/28/2016 >60  >60 mL/min Final   Comment: (NOTE) The eGFR has been calculated using the CKD EPI equation. This calculation has not been validated in all clinical situations. eGFR's persistently <60 mL/min signify possible Chronic  Kidney Disease.   Georgiann Hahn gap 01/28/2016 8  5 - 15 Final    Assessment:  Martie Muhlbauer is a 45 y.o. female with stage IA right breast invasive mammary carcinoma status post wide excision and axillary lymph node dissection on 06/21/2012.  Pathology revealed a 1.7 cm grade III ductal carcinoma.  Thirteen lymph nodes were negative.  Tumor was ER/PR positive and HER-2/neu negative. BRCA 1 and 2 testing was negative.  Oncotype DX was 29 (high) which translates into a distant recurrence of 19% at 10 yrs with tamoxifen alone.  The patient received 4 cycles of Adriamycin and Cytoxan (08/17/2012 - 10/19/2012) followed by 12 weeks of Taxol (11/19/2012 - 01/25/2013).  She completed local radiation on 04/20/2013.  She has been on tamoxifen since 04/21/2013.    CA27.29 has been followed: 17 on 01/12/2015, 18.6 on 07/27/2015, and 14.9 on 01/28/2016.  Bilateral mammogram on 08/09/2015 revealed post lumpectomy changes in the right breast. There were no new or suspicious abnormalities within either breast.    Symptomatically, she is doing well.  She has gained weight.  She has mild vasomotor symptoms.  Exam reveals moderate fibrocystic changes.  Plan: 1.  Labs today:  CBC with diff, CMP, CA27.29 2.  Review interval mammogram. 3.  Discuss weight gain issues on tamoxifen.  Check thyroid  function tests at next visit. 4.  Continue tamoxifen. 5.  Bilateral mammogram on 08/08/2016. 6.  RTC in 6 months for MD assessment and labs (CBC with diff, CMP, TSH, CA27.29)   Lequita Asal, MD  01/28/2016, 11:03 AM

## 2016-01-28 ENCOUNTER — Other Ambulatory Visit: Payer: Self-pay | Admitting: *Deleted

## 2016-01-28 ENCOUNTER — Encounter: Payer: Self-pay | Admitting: Hematology and Oncology

## 2016-01-28 ENCOUNTER — Inpatient Hospital Stay: Payer: Managed Care, Other (non HMO) | Attending: Hematology and Oncology

## 2016-01-28 ENCOUNTER — Inpatient Hospital Stay (HOSPITAL_BASED_OUTPATIENT_CLINIC_OR_DEPARTMENT_OTHER): Payer: Managed Care, Other (non HMO) | Admitting: Hematology and Oncology

## 2016-01-28 VITALS — BP 129/89 | HR 76 | Temp 95.6°F | Wt 183.2 lb

## 2016-01-28 DIAGNOSIS — Z79810 Long term (current) use of selective estrogen receptor modulators (SERMs): Secondary | ICD-10-CM | POA: Insufficient documentation

## 2016-01-28 DIAGNOSIS — C50411 Malignant neoplasm of upper-outer quadrant of right female breast: Secondary | ICD-10-CM

## 2016-01-28 DIAGNOSIS — I10 Essential (primary) hypertension: Secondary | ICD-10-CM | POA: Insufficient documentation

## 2016-01-28 DIAGNOSIS — Z801 Family history of malignant neoplasm of trachea, bronchus and lung: Secondary | ICD-10-CM

## 2016-01-28 DIAGNOSIS — Z923 Personal history of irradiation: Secondary | ICD-10-CM | POA: Diagnosis not present

## 2016-01-28 DIAGNOSIS — R635 Abnormal weight gain: Secondary | ICD-10-CM | POA: Diagnosis not present

## 2016-01-28 DIAGNOSIS — Z853 Personal history of malignant neoplasm of breast: Secondary | ICD-10-CM

## 2016-01-28 DIAGNOSIS — Z170 Estrogen receptor positive status [ER+]: Principal | ICD-10-CM

## 2016-01-28 DIAGNOSIS — Z9221 Personal history of antineoplastic chemotherapy: Secondary | ICD-10-CM | POA: Insufficient documentation

## 2016-01-28 DIAGNOSIS — C50911 Malignant neoplasm of unspecified site of right female breast: Secondary | ICD-10-CM

## 2016-01-28 DIAGNOSIS — Z803 Family history of malignant neoplasm of breast: Secondary | ICD-10-CM

## 2016-01-28 LAB — COMPREHENSIVE METABOLIC PANEL
ALT: 17 U/L (ref 14–54)
AST: 17 U/L (ref 15–41)
Albumin: 3.7 g/dL (ref 3.5–5.0)
Alkaline Phosphatase: 49 U/L (ref 38–126)
Anion gap: 8 (ref 5–15)
BUN: 14 mg/dL (ref 6–20)
CO2: 26 mmol/L (ref 22–32)
Calcium: 8.9 mg/dL (ref 8.9–10.3)
Chloride: 104 mmol/L (ref 101–111)
Creatinine, Ser: 0.82 mg/dL (ref 0.44–1.00)
GFR calc Af Amer: >60 mL/min (ref >60)
GFR calc non Af Amer: >60 mL/min (ref >60)
Glucose, Bld: 84 mg/dL (ref 65–99)
Potassium: 3.7 mmol/L (ref 3.5–5.1)
Sodium: 138 mmol/L (ref 135–145)
Total Bilirubin: 0.4 mg/dL (ref 0.3–1.2)
Total Protein: 7.1 g/dL (ref 6.5–8.1)

## 2016-01-28 LAB — CBC WITH DIFFERENTIAL/PLATELET
Basophils Absolute: 0 10*3/uL (ref 0–0.1)
Basophils Relative: 1 %
Eosinophils Absolute: 0.2 10*3/uL (ref 0–0.7)
Eosinophils Relative: 3 %
HCT: 39.3 % (ref 35.0–47.0)
Hemoglobin: 13.4 g/dL (ref 12.0–16.0)
Lymphocytes Relative: 34 %
Lymphs Abs: 2.1 10*3/uL (ref 1.0–3.6)
MCH: 29.2 pg (ref 26.0–34.0)
MCHC: 34.1 g/dL (ref 32.0–36.0)
MCV: 85.8 fL (ref 80.0–100.0)
Monocytes Absolute: 0.5 10*3/uL (ref 0.2–0.9)
Monocytes Relative: 8 %
Neutro Abs: 3.4 10*3/uL (ref 1.4–6.5)
Neutrophils Relative %: 56 %
Platelets: 209 10*3/uL (ref 150–440)
RBC: 4.58 MIL/uL (ref 3.80–5.20)
RDW: 13.1 % (ref 11.5–14.5)
WBC: 6.1 10*3/uL (ref 3.6–11.0)

## 2016-01-28 NOTE — Progress Notes (Signed)
Patient here today for 6 month f/u regarding breast cancer.

## 2016-01-29 ENCOUNTER — Other Ambulatory Visit: Payer: Self-pay | Admitting: Hematology and Oncology

## 2016-01-29 LAB — CA 27.29 (SERIAL MONITOR): CA 27.29: 14.9 U/mL (ref 0.0–38.6)

## 2016-07-25 ENCOUNTER — Encounter: Payer: Self-pay | Admitting: Internal Medicine

## 2016-08-01 ENCOUNTER — Ambulatory Visit (INDEPENDENT_AMBULATORY_CARE_PROVIDER_SITE_OTHER): Payer: Managed Care, Other (non HMO) | Admitting: Internal Medicine

## 2016-08-01 ENCOUNTER — Encounter: Payer: Self-pay | Admitting: Internal Medicine

## 2016-08-01 VITALS — BP 122/80 | HR 75 | Ht 61.0 in | Wt 181.0 lb

## 2016-08-01 DIAGNOSIS — Z7689 Persons encountering health services in other specified circumstances: Secondary | ICD-10-CM

## 2016-08-01 DIAGNOSIS — Z170 Estrogen receptor positive status [ER+]: Secondary | ICD-10-CM

## 2016-08-01 DIAGNOSIS — C50911 Malignant neoplasm of unspecified site of right female breast: Secondary | ICD-10-CM | POA: Diagnosis not present

## 2016-08-01 NOTE — Progress Notes (Signed)
    Date:  08/01/2016   Name:  Natalie Gallagher   DOB:  [birth year]   MRN:  [id number]   Chief Complaint: Establish Care (PCP moved. Need new doctor. ) Patient is previously seen by Dr. Heber Du Bois.  She has also seen [facility] but did not care for the provider.  She is due for a Pap smear.  Mammograms are handled by her oncologist.  She will be having follow there in May and will have some bloodwork done. She feels well overall. Just needs to establish care and schedule a physical.   Review of Systems  Constitutional: Negative for chills and fatigue.  Respiratory: Negative for chest tightness, shortness of breath and wheezing.   Cardiovascular: Negative for chest pain, palpitations and leg swelling.  Gastrointestinal: Negative for blood in stool, constipation and diarrhea.  Genitourinary: Negative for dyspareunia, dysuria, hematuria and menstrual problem.  Musculoskeletal: Negative for arthralgias.  Neurological: Negative for dizziness and headaches.  Psychiatric/Behavioral: Negative for dysphoric mood and sleep disturbance.    Patient Active Problem List   Diagnosis Date Noted  . Malignant neoplasm of female breast ([place]) 07/20/2013  . Hypertension 06/05/2012  . Obesity 11/28/2011    Prior to Admission medications   Medication Sig Start Date End Date Taking? Authorizing Provider  Multiple Vitamins-Calcium (VIACTIV MULTI-VITAMIN PO) Take 2 tablets by mouth daily.   Yes Historical Provider, MD  tamoxifen (NOLVADEX) 20 MG tablet TAKE 1 TABLET BY MOUTH AT NIGHT AT BEDTIME 01/29/16  Yes Lequita Asal, MD    Allergies  Allergen Reactions  . Tape     The patient showed a cutaneous reaction to Telfa applied beneath a Tegaderm dressing. Telfa appears to be the primary offensive material.    Past Surgical History:  Procedure Laterality Date  . BREAST BIOPSY Right 2014   Positive  . BREAST SURGERY Right 06/21/2012   right wide excision with sentinal node biopsy/axilary dissection.     Social History  Substance Use Topics  . Smoking status: Never Smoker  . Smokeless tobacco: Never Used  . Alcohol use No     Medication list has been reviewed and updated.   Physical Exam  Constitutional: She is oriented to person, place, and time. She appears well-developed. No distress.  HENT:  Head: Normocephalic and atraumatic.  Neck: Normal range of motion. Neck supple.  Cardiovascular: Normal rate, regular rhythm and normal heart sounds.   Pulmonary/Chest: Effort normal and breath sounds normal. No respiratory distress. She has no wheezes.  Musculoskeletal: She exhibits no edema.  Neurological: She is alert and oriented to person, place, and time.  Skin: Skin is warm and dry. No rash noted.  Psychiatric: She has a normal mood and affect. Her behavior is normal. Thought content normal.  Nursing note and vitals reviewed.   BP 122/80 (BP Location: Left Arm, Patient Position: Sitting, Cuff Size: Large)   Pulse 75   Ht 5\' 1"  (1.549 m)   Wt 181 lb (82.1 kg)   SpO2 98%   BMI 34.20 kg/m   Assessment and Plan: 1. Encounter to establish care with new doctor Return for CPX and Pap On [place] currently to help with weight loss  2. Malignant neoplasm of right breast in female, estrogen receptor positive, unspecified site of breast ([place]) Followed by Oncology Has two more years of Tamoxifen therapy   No orders of the defined types were placed in this encounter.   Halina Maidens, MD [facility]  08/01/2016

## 2016-08-11 ENCOUNTER — Ambulatory Visit
Admission: RE | Admit: 2016-08-11 | Discharge: 2016-08-11 | Disposition: A | Discharge status: Home / Self Care | Payer: Managed Care, Other (non HMO) | Source: Ambulatory Visit | Attending: Hematology and Oncology | Admitting: Hematology and Oncology

## 2016-08-11 DIAGNOSIS — Z170 Estrogen receptor positive status [ER+]: Principal | ICD-10-CM

## 2016-08-11 DIAGNOSIS — Z9889 Other specified postprocedural states: Secondary | ICD-10-CM | POA: Diagnosis not present

## 2016-08-11 DIAGNOSIS — Z853 Personal history of malignant neoplasm of breast: Secondary | ICD-10-CM | POA: Diagnosis present

## 2016-08-11 DIAGNOSIS — C50911 Malignant neoplasm of unspecified site of right female breast: Secondary | ICD-10-CM

## 2016-08-11 HISTORY — DX: Personal history of irradiation: Z92.3

## 2016-08-11 HISTORY — DX: Personal history of antineoplastic chemotherapy: Z92.21

## 2016-08-12 ENCOUNTER — Ambulatory Visit: Payer: Managed Care, Other (non HMO) | Admitting: Hematology and Oncology

## 2016-08-12 ENCOUNTER — Other Ambulatory Visit: Payer: Managed Care, Other (non HMO)

## 2016-08-19 ENCOUNTER — Encounter: Payer: Self-pay | Admitting: General Surgery

## 2016-08-19 ENCOUNTER — Ambulatory Visit (INDEPENDENT_AMBULATORY_CARE_PROVIDER_SITE_OTHER): Payer: Managed Care, Other (non HMO) | Admitting: General Surgery

## 2016-08-19 VITALS — BP 124/80 | Resp 12 | Ht 60.0 in | Wt 181.0 lb

## 2016-08-19 DIAGNOSIS — Z1211 Encounter for screening for malignant neoplasm of colon: Secondary | ICD-10-CM

## 2016-08-19 DIAGNOSIS — Z170 Estrogen receptor positive status [ER+]: Secondary | ICD-10-CM

## 2016-08-19 DIAGNOSIS — C50411 Malignant neoplasm of upper-outer quadrant of right female breast: Secondary | ICD-10-CM

## 2016-08-19 MED ORDER — POLYETHYLENE GLYCOL 3350 17 GM/SCOOP PO POWD: 1.0000 | Freq: Once | ORAL | 0 refills | Status: AC

## 2016-08-19 NOTE — Patient Instructions (Addendum)
Colonoscopy, Adult A colonoscopy is an exam to look at the entire large intestine. During the exam, a lubricated, bendable tube is inserted into the anus and then passed into the rectum, colon, and other parts of the large intestine. A colonoscopy is often done as a part of normal colorectal screening or in response to certain symptoms, such as anemia, persistent diarrhea, abdominal pain, and blood in the stool. The exam can help screen for and diagnose medical problems, including:  Tumors.  Polyps.  Inflammation.  Areas of bleeding. Tell a health care provider about:  Any allergies you have.  All medicines you are taking, including vitamins, herbs, eye drops, creams, and over-the-counter medicines.  Any problems you or family members have had with anesthetic medicines.  Any blood disorders you have.  Any surgeries you have had.  Any medical conditions you have.  Any problems you have had passing stool. What are the risks? Generally, this is a safe procedure. However, problems may occur, including:  Bleeding.  A tear in the intestine.  A reaction to medicines given during the exam.  Infection (rare). What happens before the procedure? Eating and drinking restrictions  Follow instructions from your health care provider about eating and drinking, which may include:  A few days before the procedure - follow a low-fiber diet. Avoid nuts, seeds, dried fruit, raw fruits, and vegetables.  1-3 days before the procedure - follow a clear liquid diet. Drink only clear liquids, such as clear broth or bouillon, black coffee or tea, clear juice, clear soft drinks or sports drinks, gelatin dessert, and popsicles. Avoid any liquids that contain red or purple dye.  On the day of the procedure - do not eat or drink anything during the 2 hours before the procedure, or within the time period that your health care provider recommends. Bowel prep  If you were prescribed an oral bowel prep to  clean out your colon:  Take it as told by your health care provider. Starting the day before your procedure, you will need to drink a large amount of medicated liquid. The liquid will cause you to have multiple loose stools until your stool is almost clear or light green.  If your skin or anus gets irritated from diarrhea, you may use these to relieve the irritation:  Medicated wipes, such as adult wet wipes with aloe and vitamin E.  A skin soothing-product like petroleum jelly.  If you vomit while drinking the bowel prep, take a break for up to 60 minutes and then begin the bowel prep again. If vomiting continues and you cannot take the bowel prep without vomiting, call your health care provider. General instructions   Ask your health care provider about changing or stopping your regular medicines. This is especially important if you are taking diabetes medicines or blood thinners.  Plan to have someone take you home from the hospital or clinic. What happens during the procedure?  An IV tube may be inserted into one of your veins.  You will be given medicine to help you relax (sedative).  To reduce your risk of infection:  Your health care team will wash or sanitize their hands.  Your anal area will be washed with soap.  You will be asked to lie on your side with your knees bent.  Your health care provider will lubricate a long, thin, flexible tube. The tube will have a camera and a light on the end.  The tube will be inserted into your anus.    The tube will be gently eased through your rectum and colon.  Air will be delivered into your colon to keep it open. You may feel some pressure or cramping.  The camera will be used to take images during the procedure.  A small tissue sample may be removed from your body to be examined under a microscope (biopsy). If any potential problems are found, the tissue will be sent to a lab for testing.  If small polyps are found, your health  care provider may remove them and have them checked for cancer cells.  The tube that was inserted into your anus will be slowly removed. The procedure may vary among health care providers and hospitals. What happens after the procedure?  Your blood pressure, heart rate, breathing rate, and blood oxygen level will be monitored until the medicines you were given have worn off.  Do not drive for 24 hours after the exam.  You may have a small amount of blood in your stool.  You may pass gas and have mild abdominal cramping or bloating due to the air that was used to inflate your colon during the exam.  It is up to you to get the results of your procedure. Ask your health care provider, or the department performing the procedure, when your results will be ready. This information is not intended to replace advice given to you by your health care provider. Make sure you discuss any questions you have with your health care provider. Document Released: 03/21/2000 Document Revised: 01/23/2016 Document Reviewed: 06/05/2015 Elsevier Interactive Patient Education  2017 [employer].  The patient is scheduled for a Colonoscopy at [facility] on 10/01/16. They are aware to call the day before to get their arrival time. Miralax prescription has been sent into the patient's pharmacy. The patient is aware of date and instructions.

## 2016-08-19 NOTE — Progress Notes (Signed)
Patient ID: Natalie Gallagher, female   DOB: 1970-12-18, 46 y.o.   MRN: [id number]  Chief Complaint  Patient presents with  . Breast Cancer    HPI Natalie Gallagher is a 46 y.o. female here today for her one year follow up breast cancer. Patient states she is doing well. Mammogram and right breast ultrasound done on 08/11/2016.  Doing well on her Tamoxifen.  HPI  Past Medical History:  Diagnosis Date  . Breast cancer ([place]) 2014   Chemo/radiaiton- Rt.  . Breast cancer of upper-outer quadrant of right female breast [facility]) March 2014   T1c,N0,M0; BRCA neg. ER: 60%; PR: 70%, her 2 neu not over expressed.   . Cancer [facility]) 2014   right breast.right wide excision with sentinal node biopsy on 06/21/12  . Lymphedema 2015   Right Arm.  . Personal history of chemotherapy   . Personal history of radiation therapy     Past Surgical History:  Procedure Laterality Date  . BREAST BIOPSY Right 2014   Positive  . BREAST SURGERY Right 06/21/2012   right wide excision with sentinal node biopsy/axilary dissection.  Marland Kitchen STRABISMUS SURGERY      Family History  Problem Relation Age of Onset  . Cancer Paternal Uncle        Lung  . Cancer Cousin        Breast   . Cancer Maternal Aunt        lung  . Diabetes Mother   . Hypertension Father   . Diabetes Father   . Breast cancer Neg Hx     Social History Social History  Substance Use Topics  . Smoking status: Never Smoker  . Smokeless tobacco: Never Used  . Alcohol use No    Allergies  Allergen Reactions  . Tape     The patient showed a cutaneous reaction to Telfa applied beneath a Tegaderm dressing. Telfa appears to be the primary offensive material.    Current Outpatient Prescriptions  Medication Sig Dispense Refill  . Multiple Vitamins-Calcium (VIACTIV MULTI-VITAMIN PO) Take 2 tablets by mouth daily.    . tamoxifen (NOLVADEX) 20 MG tablet TAKE 1 TABLET BY MOUTH AT NIGHT AT BEDTIME 30 tablet 3  . polyethylene glycol powder (GLYCOLAX/MIRALAX) powder  Take 255 g by mouth once. Mix whole container with 64 ounces of clear liquids 255 g 0   No current facility-administered medications for this visit.     Review of Systems Review of Systems  Respiratory: Negative.   Cardiovascular: Negative.     Blood pressure 124/80, resp. rate 12, height 5' (1.524 m), weight 181 lb (82.1 kg), last menstrual period 07/27/2012.  Physical Exam Physical Exam  Constitutional: She is oriented to person, place, and time. She appears well-developed and well-nourished.  Eyes: Conjunctivae are normal. No scleral icterus.  Neck: Neck supple.  Cardiovascular: Normal rate, regular rhythm and normal heart sounds.   Pulmonary/Chest: Effort normal and breath sounds normal. Right breast exhibits no inverted nipple, no mass, no nipple discharge, no skin change and no tenderness. Left breast exhibits no inverted nipple, no mass, no nipple discharge, no skin change and no tenderness.    No visible upper extremity asymmetry.  Abdominal: Soft. Bowel sounds are normal. There is no tenderness.  Lymphadenopathy:    She has no cervical adenopathy.    She has no axillary adenopathy.  Neurological: She is alert and oriented to person, place, and time.  Skin: Skin is warm and dry.    Data Reviewed Bilateral mammograms  dated 08/11/2016 were reviewed. No interval change. BI-RADS-2.  Assessment    Evidence of recurrent breast cancer.  Candidate for screening colonoscopy based on past history of breast cancer an African-American ancestry.    Plan    The patient will continue on her tamoxifen as she is tolerating this well.   Mammograms and antiestrogen therapy is being managed by Nolon Stalls, M.D.    Colonoscopy with possible biopsy/polypectomy prn: Information regarding the procedure, including its potential risks and complications (including but not limited to perforation of the bowel, which may require emergency surgery to repair, and bleeding) was verbally  given to the patient. Educational information regarding lower intestinal endoscopy was given to the patient. Written instructions for how to complete the bowel prep using Miralax were provided. The importance of drinking ample fluids to avoid dehydration as a result of the prep emphasized.  Patient to return in one year breast cancer check up.   HPI, Physical Exam, Assessment and Plan have been scribed under the direction and in the presence of Hervey Ard, MD.  Gaspar Cola, CMA  The patient is scheduled for a Colonoscopy at [facility] on 10/01/16. They are aware to call the day before to get their arrival time. Miralax prescription has been sent into the patient's pharmacy. The patient is aware of date and instructions.  Documented by Lesly Rubenstein LPN   Robert Bellow 08/19/2016, 8:30 PM

## 2016-08-21 ENCOUNTER — Encounter: Payer: Self-pay | Admitting: Hematology and Oncology

## 2016-08-21 ENCOUNTER — Inpatient Hospital Stay: Payer: Managed Care, Other (non HMO) | Attending: Hematology and Oncology

## 2016-08-21 ENCOUNTER — Inpatient Hospital Stay (HOSPITAL_BASED_OUTPATIENT_CLINIC_OR_DEPARTMENT_OTHER): Payer: Managed Care, Other (non HMO) | Admitting: Hematology and Oncology

## 2016-08-21 VITALS — BP 128/85 | HR 64 | Temp 96.8°F | Resp 18 | Wt 181.1 lb

## 2016-08-21 DIAGNOSIS — Z923 Personal history of irradiation: Secondary | ICD-10-CM

## 2016-08-21 DIAGNOSIS — C50411 Malignant neoplasm of upper-outer quadrant of right female breast: Secondary | ICD-10-CM | POA: Diagnosis not present

## 2016-08-21 DIAGNOSIS — I890 Lymphedema, not elsewhere classified: Secondary | ICD-10-CM | POA: Diagnosis not present

## 2016-08-21 DIAGNOSIS — Z79810 Long term (current) use of selective estrogen receptor modulators (SERMs): Secondary | ICD-10-CM

## 2016-08-21 DIAGNOSIS — C50911 Malignant neoplasm of unspecified site of right female breast: Secondary | ICD-10-CM

## 2016-08-21 DIAGNOSIS — R635 Abnormal weight gain: Secondary | ICD-10-CM

## 2016-08-21 DIAGNOSIS — R5383 Other fatigue: Secondary | ICD-10-CM | POA: Diagnosis not present

## 2016-08-21 DIAGNOSIS — Z170 Estrogen receptor positive status [ER+]: Secondary | ICD-10-CM

## 2016-08-21 DIAGNOSIS — Z9221 Personal history of antineoplastic chemotherapy: Secondary | ICD-10-CM | POA: Diagnosis not present

## 2016-08-21 LAB — COMPREHENSIVE METABOLIC PANEL
ALT: 21 U/L (ref 14–54)
AST: 23 U/L (ref 15–41)
Albumin: 3.7 g/dL (ref 3.5–5.0)
Alkaline Phosphatase: 52 U/L (ref 38–126)
Anion gap: 6 (ref 5–15)
BUN: 12 mg/dL (ref 6–20)
CO2: 25 mmol/L (ref 22–32)
Calcium: 9.1 mg/dL (ref 8.9–10.3)
Chloride: 107 mmol/L (ref 101–111)
Creatinine, Ser: 0.85 mg/dL (ref 0.44–1.00)
GFR calc Af Amer: >60 mL/min (ref >60)
GFR calc non Af Amer: >60 mL/min (ref >60)
Glucose, Bld: 82 mg/dL (ref 65–99)
Potassium: 3.8 mmol/L (ref 3.5–5.1)
Sodium: 138 mmol/L (ref 135–145)
Total Bilirubin: 0.3 mg/dL (ref 0.3–1.2)
Total Protein: 7.1 g/dL (ref 6.5–8.1)

## 2016-08-21 LAB — CBC WITH DIFFERENTIAL/PLATELET
Basophils Absolute: 0.1 10*3/uL (ref 0–0.1)
Basophils Relative: 1 %
Eosinophils Absolute: 0.1 10*3/uL (ref 0–0.7)
Eosinophils Relative: 2 %
HCT: 38 % (ref 35.0–47.0)
Hemoglobin: 13 g/dL (ref 12.0–16.0)
Lymphocytes Relative: 36 %
Lymphs Abs: 1.9 10*3/uL (ref 1.0–3.6)
MCH: 29.5 pg (ref 26.0–34.0)
MCHC: 34.2 g/dL (ref 32.0–36.0)
MCV: 86.3 fL (ref 80.0–100.0)
Monocytes Absolute: 0.5 10*3/uL (ref 0.2–0.9)
Monocytes Relative: 8 %
Neutro Abs: 2.9 10*3/uL (ref 1.4–6.5)
Neutrophils Relative %: 53 %
Platelets: 197 10*3/uL (ref 150–440)
RBC: 4.41 MIL/uL (ref 3.80–5.20)
RDW: 13.2 % (ref 11.5–14.5)
WBC: 5.4 10*3/uL (ref 3.6–11.0)

## 2016-08-21 LAB — TSH: TSH: 2.292 u[IU]/mL (ref 0.350–4.500)

## 2016-08-21 NOTE — Progress Notes (Signed)
Patient states at times she gets really tired.  Feels drained.  Continues to have hot flashes.

## 2016-08-21 NOTE — Progress Notes (Signed)
[facility] day:  08/21/16  Chief Complaint: Natalie Gallagher is a 46 y.o. female with stage IA right breast cancer who is seen for 6 month assessment.  HPI: The patient was last seen in the medical oncology clinic on 01/28/2016.  At that time, she was doing well on tamoxifen.  She denied any breast concerns.  Labs including a CBC with diff, CMP, and CA27.29 (14.9) were normal.  Bilateral mammogram on 08/11/2016 revealed no evidence of malignancy.  During the interim, she describes feeling tired sometimes.  Hot flashes are "ok".  She has lymphedema, but does not wear her sleeve.  She states that she wraps now and then if she works out. She needs an eye exam. Pelvic exam is performed by Dr. Army Melia.   Past Medical History:  Diagnosis Date  . Breast cancer ([place]) 2014   Chemo/radiaiton- Rt.  . Breast cancer of upper-outer quadrant of right female breast [facility]) March 2014   T1c,N0,M0; BRCA neg. ER: 60%; PR: 70%, her 2 neu not over expressed.   . Cancer [facility]) 2014   right breast.right wide excision with sentinal node biopsy on 06/21/12  . Lymphedema 2015   Right Arm.  . Personal history of chemotherapy   . Personal history of radiation therapy     Past Surgical History:  Procedure Laterality Date  . BREAST BIOPSY Right 2014   Positive  . BREAST SURGERY Right 06/21/2012   right wide excision with sentinal node biopsy/axilary dissection.  Marland Kitchen STRABISMUS SURGERY      Family History  Problem Relation Age of Onset  . Cancer Paternal Uncle        Lung  . Cancer Cousin        Breast   . Cancer Maternal Aunt        lung  . Diabetes Mother   . Hypertension Father   . Diabetes Father   . Breast cancer Neg Hx     Social History:  reports that she has never smoked. She has never used smokeless tobacco. She reports that she does not drink alcohol or use drugs.  She is going to the gym.  She lives in [place].  The patient is alone  today.  Allergies:  Allergies  Allergen Reactions  . Tape     The patient showed a cutaneous reaction to Telfa applied beneath a Tegaderm dressing. Telfa appears to be the primary offensive material.    Current Medications: Current Outpatient Prescriptions  Medication Sig Dispense Refill  . Multiple Vitamins-Calcium (VIACTIV MULTI-VITAMIN PO) Take 2 tablets by mouth daily.    . tamoxifen (NOLVADEX) 20 MG tablet TAKE 1 TABLET BY MOUTH AT NIGHT AT BEDTIME 30 tablet 3   No current facility-administered medications for this visit.     Review of Systems:  GENERAL:  Feels "tired sometimes".  No fevers or sweats.  Weight down 2 pounds. PERFORMANCE STATUS (ECOG):  0 HEENT:  No visual changes, runny nose, sore throat, mouth sores or tenderness. Lungs: No shortness of breath or cough.  No hemoptysis. Cardiac:  No chest pain, palpitations, orthopnea, or PND. GI:  No nausea, vomiting, diarrhea, constipation, melena or hematochezia. GU:  No urgency, frequency, dysuria, or hematuria. Musculoskeletal:  No back pain.  No joint pain.  No muscle tenderness. Extremities:  Lymphedema (see HPI).  No pain or lower extremity swelling. Skin:  No rashes or skin changes. Neuro:  No headache, numbness or weakness, balance or coordination issues. Endocrine: Hot  flashes ("ok").  No diabetes, thyroid issues, or night sweats. Psych:  No mood changes, depression or anxiety. Pain:  No focal pain. Review of systems:  All other systems reviewed and found to be negative.  Physical Exam: Blood pressure 128/85, pulse 64, temperature (!) 96.8 F (36 C), temperature source Tympanic, resp. rate 18, weight 181 lb 1 oz (82.1 kg), last menstrual period 07/27/2012. GENERAL:  Well developed, well nourished, woman sitting comfortably in the exam room in no acute distress. MENTAL STATUS:  Alert and oriented to person, place and time. HEAD:  Curly black hair.  Normocephalic, atraumatic, face symmetric, no Cushingoid  features. EYES:  Brown eyes.  Pupils equal round and reactive to light and accomodation.  No conjunctivitis or scleral icterus. ENT:  Oropharynx clear without lesion.  Tongue normal. Mucous membranes moist.  RESPIRATORY:  Clear to auscultation without rales, wheezes or rhonchi. CARDIOVASCULAR:  Regular rate and rhythm without murmur, rub or gallop. BREAST:  Large breasts.  Right breast with moderate inferior quadrant post-operative and post radiation changes.  Significant fibrocystic changes left breast.  No discrete masses, skin changes or nipple discharge.   ABDOMEN:  Soft, non-tender, with active bowel sounds, and no appreciable hepatosplenomegaly.  No masses. SKIN:  No rashes, ulcers or lesions. EXTREMITIES: Lymphedema right upper extremity.  No edema, no skin discoloration or tenderness.  No palpable cords. LYMPH NODES: No palpable cervical, supraclavicular, axillary or inguinal adenopathy  NEUROLOGICAL: Unremarkable. PSYCH:  Appropriate.   Appointment on 08/21/2016  Component Date Value Ref Range Status  . Sodium 08/21/2016 138  135 - 145 mmol/L Final  . Potassium 08/21/2016 3.8  3.5 - 5.1 mmol/L Final  . Chloride 08/21/2016 107  101 - 111 mmol/L Final  . CO2 08/21/2016 25  22 - 32 mmol/L Final  . Glucose, Bld 08/21/2016 82  65 - 99 mg/dL Final  . BUN 08/21/2016 12  6 - 20 mg/dL Final  . Creatinine, Ser 08/21/2016 0.85  0.44 - 1.00 mg/dL Final  . Calcium 08/21/2016 9.1  8.9 - 10.3 mg/dL Final  . Total Protein 08/21/2016 7.1  6.5 - 8.1 g/dL Final  . Albumin 08/21/2016 3.7  3.5 - 5.0 g/dL Final  . AST 08/21/2016 23  15 - 41 U/L Final  . ALT 08/21/2016 21  14 - 54 U/L Final  . Alkaline Phosphatase 08/21/2016 52  38 - 126 U/L Final  . Total Bilirubin 08/21/2016 0.3  0.3 - 1.2 mg/dL Final  . GFR calc non Af Amer 08/21/2016 >60  >60 mL/min Final  . GFR calc Af Amer 08/21/2016 >60  >60 mL/min Final   Comment: (NOTE) The eGFR has been calculated using the CKD EPI equation. This  calculation has not been validated in all clinical situations. eGFR's persistently <60 mL/min signify possible Chronic Kidney Disease.   Georgiann Hahn gap 08/21/2016 6  5 - 15 Final    Assessment:  Natalie Gallagher is a 46 y.o. female with stage IA right breast invasive mammary carcinoma status post wide excision and axillary lymph node dissection on 06/21/2012.  Pathology revealed a 1.7 cm grade III ductal carcinoma.  Thirteen lymph nodes were negative.  Tumor was ER/PR positive and HER-2/neu negative. BRCA 1 and 2 testing was negative.  Oncotype DX was 29 (high) which translates into a distant recurrence of 19% at 10 yrs with tamoxifen alone.  The patient received 4 cycles of Adriamycin and Cytoxan (08/17/2012 - 10/19/2012) followed by 12 weeks of Taxol (11/19/2012 - 01/25/2013).  She completed  local radiation on 04/20/2013.  She has been on tamoxifen since 04/21/2013.    CA27.29 has been followed: 17 on 01/12/2015, 18.6 on 07/27/2015, 14.9 on 01/28/2016, and 13.9 on 08/21/2016.  Bilateral mammogram on 08/11/2016 revealed no evidence of malignancy.   Symptomatically, she notes intermittent fatigue  She has mild vasomotor symptoms.  Exam reveals moderate fibrocystic changes and right upper extremity lymphedema.  Plan: 1.  Labs today:  CBC with diff, CMP, CA27.29, TSH. 2.  Review interval mammogram- no evidence of malignancy. 3.  Discuss eye exam and pelvic exam. 4.  Contact patient with lab results today. 5.  Continue tamoxifen. 6.  RTC in 6 months for MD assessment and labs (CBC with diff, CMP, CA27.29).   Lequita Asal, MD  08/21/2016, 10:28 AM

## 2016-08-22 LAB — CANCER ANTIGEN 27.29: CA 27.29: 13.9 U/mL (ref 0.0–38.6)

## 2016-09-25 ENCOUNTER — Encounter: Payer: Self-pay | Admitting: *Deleted

## 2016-09-29 ENCOUNTER — Telehealth: Payer: Self-pay | Admitting: *Deleted

## 2016-09-29 NOTE — Telephone Encounter (Signed)
Message left for patient to call the office.   My Chart message sent last week but patient has not read it yet.   Patient scheduled for a colonoscopy on Wednesday, 10-01-16 with Dr. Bary Castilla.   We need to confirm that patient has not had a change in her medications or health since her last office visit.   Also, need to confirm she has picked up Miralax prescription.

## 2016-09-29 NOTE — Telephone Encounter (Signed)
Patient called back and no medication changes. She has her miralax prescription. No questions about the prep or procedure.

## 2016-10-01 ENCOUNTER — Ambulatory Visit
Admission: RE | Admit: 2016-10-01 | Discharge: 2016-10-01 | Disposition: A | Discharge status: Home / Self Care | Payer: Managed Care, Other (non HMO) | Source: Ambulatory Visit | Attending: General Surgery | Admitting: General Surgery

## 2016-10-01 ENCOUNTER — Encounter
Admission: RE | Disposition: A | Discharge status: Home / Self Care | Payer: Self-pay | Source: Ambulatory Visit | Attending: General Surgery

## 2016-10-01 ENCOUNTER — Ambulatory Visit: Payer: Managed Care, Other (non HMO) | Admitting: Anesthesiology

## 2016-10-01 ENCOUNTER — Encounter: Payer: Self-pay | Admitting: *Deleted

## 2016-10-01 DIAGNOSIS — Z923 Personal history of irradiation: Secondary | ICD-10-CM | POA: Insufficient documentation

## 2016-10-01 DIAGNOSIS — Z9221 Personal history of antineoplastic chemotherapy: Secondary | ICD-10-CM | POA: Insufficient documentation

## 2016-10-01 DIAGNOSIS — Z1211 Encounter for screening for malignant neoplasm of colon: Secondary | ICD-10-CM

## 2016-10-01 DIAGNOSIS — Z853 Personal history of malignant neoplasm of breast: Secondary | ICD-10-CM | POA: Insufficient documentation

## 2016-10-01 DIAGNOSIS — Z79810 Long term (current) use of selective estrogen receptor modulators (SERMs): Secondary | ICD-10-CM | POA: Diagnosis not present

## 2016-10-01 HISTORY — PX: COLONOSCOPY WITH PROPOFOL: SHX5780

## 2016-10-01 SURGERY — COLONOSCOPY WITH PROPOFOL
Anesthesia: General

## 2016-10-01 MED ORDER — PROPOFOL 500 MG/50ML IV EMUL
INTRAVENOUS | Status: AC
  Filled 2016-10-01: qty 50

## 2016-10-01 MED ORDER — SODIUM CHLORIDE 0.9 % IV SOLN
INTRAVENOUS | Status: DC
  Administered 2016-10-01 (×2): via INTRAVENOUS

## 2016-10-01 MED ORDER — PROPOFOL 10 MG/ML IV BOLUS
INTRAVENOUS | Status: DC | PRN
  Administered 2016-10-01: 40 mg via INTRAVENOUS

## 2016-10-01 MED ORDER — PROPOFOL 500 MG/50ML IV EMUL
INTRAVENOUS | Status: DC | PRN
  Administered 2016-10-01: 125 ug/kg/min via INTRAVENOUS

## 2016-10-01 NOTE — Anesthesia Preprocedure Evaluation (Signed)
Anesthesia Evaluation  Patient identified by MRN, date of birth, ID band Patient awake    Reviewed: Allergy & Precautions, NPO status , Patient's Chart, lab work & pertinent test results  History of Anesthesia Complications Negative for: history of anesthetic complications  Airway Mallampati: III  TM Distance: >3 FB Neck ROM: Full    Dental no notable dental hx.    Pulmonary neg pulmonary ROS, neg sleep apnea, neg COPD,    breath sounds clear to auscultation- rhonchi (-) wheezing      Cardiovascular Exercise Tolerance: Good (-) hypertension(-) CAD and (-) Past MI  Rhythm:Regular Rate:Normal - Systolic murmurs and - Diastolic murmurs    Neuro/Psych negative neurological ROS  negative psych ROS   GI/Hepatic negative GI ROS, Neg liver ROS,   Endo/Other  negative endocrine ROSneg diabetes  Renal/GU negative Renal ROS     Musculoskeletal negative musculoskeletal ROS (+)   Abdominal (+) + obese,   Peds  Hematology negative hematology ROS (+)   Anesthesia Other Findings Past Medical History: 2014: Breast cancer ([place])     Comment: Chemo/radiaiton- Rt. March 2014: Breast cancer of upper-outer quadrant of right*     Comment: T1c,N0,M0; BRCA neg. ER: 60%; PR: 70%, her 2               neu not over expressed.  2014: Cancer [facility])     Comment: right breast.right wide excision with sentinal              node biopsy on 06/21/12 2015: Lymphedema     Comment: Right Arm. No date: Personal history of chemotherapy No date: Personal history of radiation therapy   Reproductive/Obstetrics                             Anesthesia Physical Anesthesia Plan  ASA: II  Anesthesia Plan: General   Post-op Pain Management:    Induction: Intravenous  PONV Risk Score and Plan: 2 and Propofol  Airway Management Planned: Natural Airway  Additional Equipment:   Intra-op Plan:   Post-operative Plan:    Informed Consent: I have reviewed the patients History and Physical, chart, labs and discussed the procedure including the risks, benefits and alternatives for the proposed anesthesia with the patient or authorized representative who has indicated his/her understanding and acceptance.   Dental advisory given  Plan Discussed with: CRNA and Anesthesiologist  Anesthesia Plan Comments:         Anesthesia Quick Evaluation

## 2016-10-01 NOTE — Anesthesia Postprocedure Evaluation (Signed)
Anesthesia Post Note  Patient: Natalie Gallagher  Procedure(s) Performed: Procedure(s) (LRB): COLONOSCOPY WITH PROPOFOL (N/A)  Patient location during evaluation: Endoscopy Anesthesia Type: General Level of consciousness: awake and alert and oriented Pain management: pain level controlled Vital Signs Assessment: post-procedure vital signs reviewed and stable Respiratory status: spontaneous breathing, nonlabored ventilation and respiratory function stable Cardiovascular status: blood pressure returned to baseline and stable Postop Assessment: no signs of nausea or vomiting Anesthetic complications: no     Last Vitals:  Vitals:   10/01/16 1100 10/01/16 1110  BP: (!) 137/92 (!) 153/99  Pulse: 80 64  Resp: 10 13  Temp:      Last Pain:  Vitals:   10/01/16 0906  TempSrc: Tympanic                 Ithan Touhey

## 2016-10-01 NOTE — H&P (Signed)
Natalie Gallagher [id number] 1971-03-25     HPI: Healthy 46 year old African-American woman for a screening colonoscopy.  The patient reports tolerating the prep well.  Prescriptions Prior to Admission  Medication Sig Dispense Refill Last Dose  . tamoxifen (NOLVADEX) 20 MG tablet TAKE 1 TABLET BY MOUTH AT NIGHT AT BEDTIME 30 tablet 3 09/30/2016 at Unknown time  . Multiple Vitamins-Calcium (VIACTIV MULTI-VITAMIN PO) Take 2 tablets by mouth daily.   Taking   Allergies  Allergen Reactions  . Tape     The patient showed a cutaneous reaction to Telfa applied beneath a Tegaderm dressing. Telfa appears to be the primary offensive material.   Past Medical History:  Diagnosis Date  . Breast cancer ([place]) 2014   Chemo/radiaiton- Rt.  . Breast cancer of upper-outer quadrant of right female breast [facility]) March 2014   T1c,N0,M0; BRCA neg. ER: 60%; PR: 70%, her 2 neu not over expressed.   . Cancer [facility]) 2014   right breast.right wide excision with sentinal node biopsy on 06/21/12  . Lymphedema 2015   Right Arm.  . Personal history of chemotherapy   . Personal history of radiation therapy    Past Surgical History:  Procedure Laterality Date  . BREAST BIOPSY Right 2014   Positive  . BREAST SURGERY Right 06/21/2012   right wide excision with sentinal node biopsy/axilary dissection.  Marland Kitchen STRABISMUS SURGERY     Social History   Social History  . Marital status: Divorced    Spouse name: N/A  . Number of children: 0  . Years of education: N/A   Occupational History  . Not on file.   Social History Main Topics  . Smoking status: Never Smoker  . Smokeless tobacco: Never Used  . Alcohol use No  . Drug use: No  . Sexual activity: No   Other Topics Concern  . Not on file   Social History Narrative  . No narrative on file   Social History   Social History Narrative  . No narrative on file     ROS: Negative.     PE: HEENT: Negative. Lungs: Clear. Cardio:  RR.  Assessment/Plan:  Proceed with planned endoscopy.  Robert Bellow 10/01/2016   Assessment/Plan:  Proceed with planned endoscopy.

## 2016-10-01 NOTE — Transfer of Care (Signed)
Immediate Anesthesia Transfer of Care Note  Patient: Natalie Gallagher  Procedure(s) Performed: Procedure(s): COLONOSCOPY WITH PROPOFOL (N/A)  Patient Location: PACU and Endoscopy Unit  Anesthesia Type:General  Level of Consciousness: awake, alert  and oriented  Airway & Oxygen Therapy: Patient Spontanous Breathing and Patient connected to nasal cannula oxygen  Post-op Assessment: Report given to RN and Post -op Vital signs reviewed and stable  Post vital signs: Reviewed and stable  Last Vitals:  Vitals:   10/01/16 0906 10/01/16 1040  BP: 115/72 (P) 115/62  Pulse: 78   Resp: 18   Temp: 36.3 C     Last Pain:  Vitals:   10/01/16 0906  TempSrc: Tympanic         Complications: No apparent anesthesia complications

## 2016-10-01 NOTE — Op Note (Signed)
[facility] Gastroenterology Patient Name: Natalie Gallagher Procedure Date: 10/01/2016 10:03 AM MRN: [id number] Account #: [id number] Date of Birth: 10-Jun-1970 Admit Type: Outpatient Age: 46 Room: [facility] ENDO ROOM 1 Gender: Female Note Status: Finalized Procedure:            Colonoscopy Indications:          Screening for colorectal malignant neoplasm Providers:            Robert Bellow, MD Referring MD:         Halina Maidens, MD (Referring MD) Medicines:            Monitored Anesthesia Care Complications:        No immediate complications. Procedure:            Pre-Anesthesia Assessment:                       - Prior to the procedure, a History and Physical was                        performed, and patient medications, allergies and                        sensitivities were reviewed. The patient's tolerance of                        previous anesthesia was reviewed.                       - The risks and benefits of the procedure and the                        sedation options and risks were discussed with the                        patient. All questions were answered and informed                        consent was obtained.                       After obtaining informed consent, the colonoscope was                        passed under direct vision. Throughout the procedure,                        the patient's blood pressure, pulse, and oxygen                        saturations were monitored continuously. The                        Colonoscope was introduced through the anus and                        advanced to the the terminal ileum. The colonoscopy was                        performed without difficulty. The patient tolerated the  procedure well. The quality of the bowel preparation                        was excellent. Findings:      The entire examined colon appeared normal on direct and retroflexion       views. Impression:            - The entire examined colon is normal on direct and                        retroflexion views.                       - No specimens collected. Recommendation:       - Discharge patient to home (via wheelchair).                       - Repeat colonoscopy in 10 years for screening purposes. Procedure Code(s):    --- Professional ---                       [phone], Colonoscopy, flexible; diagnostic, including                        collection of specimen(s) by brushing or washing, when                        performed (separate procedure) Diagnosis Code(s):    --- Professional ---                       Z12.11, Encounter for screening for malignant neoplasm                        of colon CPT copyright 2016 [employer]. All rights reserved. The codes documented in this report are preliminary and upon coder review may  be revised to meet current compliance requirements. Robert Bellow, MD 10/01/2016 10:37:09 AM This report has been signed electronically. Number of Addenda: 0 Note Initiated On: 10/01/2016 10:03 AM Scope Withdrawal Time: 0 hours 8 minutes 27 seconds  Total Procedure Duration: 0 hours 17 minutes 33 seconds       [facility]

## 2016-10-01 NOTE — Anesthesia Post-op Follow-up Note (Cosign Needed)
Anesthesia QCDR form completed.        

## 2016-10-02 ENCOUNTER — Encounter: Payer: Managed Care, Other (non HMO) | Admitting: Obstetrics and Gynecology

## 2016-10-02 ENCOUNTER — Encounter: Payer: Self-pay | Admitting: General Surgery

## 2016-11-27 ENCOUNTER — Ambulatory Visit
Admission: EM | Admit: 2016-11-27 | Discharge: 2016-11-27 | Disposition: A | Discharge status: Home / Self Care | Payer: Managed Care, Other (non HMO) | Attending: Family Medicine | Admitting: Family Medicine

## 2016-11-27 ENCOUNTER — Telehealth: Payer: Self-pay | Admitting: *Deleted

## 2016-11-27 DIAGNOSIS — R21 Rash and other nonspecific skin eruption: Secondary | ICD-10-CM

## 2016-11-27 DIAGNOSIS — W57XXXA Bitten or stung by nonvenomous insect and other nonvenomous arthropods, initial encounter: Secondary | ICD-10-CM | POA: Diagnosis not present

## 2016-11-27 MED ORDER — SULFAMETHOXAZOLE-TRIMETHOPRIM 800-160 MG PO TABS: 1.0000 | ORAL_TABLET | Freq: Two times a day (BID) | ORAL | 0 refills | Status: DC

## 2016-11-27 MED ORDER — MUPIROCIN 2 % EX OINT
1.0000 | TOPICAL_OINTMENT | Freq: Two times a day (BID) | CUTANEOUS | 0 refills | Status: DC
Start: 2016-11-27 — End: 2017-01-08

## 2016-11-27 NOTE — ED Provider Notes (Signed)
[facility]    CSN: [id number] Arrival date & time: 11/27/16  1619     History   Chief Complaint Chief Complaint  Patient presents with  . Insect Bite    HPI Natalie Gallagher is a 46 y.o. female.   Patient is a 46 year old black female whose breast survivor. She reports a loss was embedded stone in the right forearm. This is the side where she had lumpectomy for breast cancer about 4 years ago. This happened 3 days ago and since then the swelling of the right forearm and redness over where she was stung. She was advised. By her oncologist to come to the urgent care be seen and evaluated. She has lymphedema in that arm since she's had lumpectomy. Patient denies being allergic to medication she does not smoke tobacco and cocaine marijuana. Other than lumpectomy and colonoscopy no other surgeries or operations she's takes tamoxifen no pertinent family medical history relevant to today's visit.   The history is provided by the patient. No language interpreter was used.  Abscess  Location:  Shoulder/arm Shoulder/arm abscess location:  R arm Abscess quality: induration, itching and warmth   Abscess quality: no fluctuance   Red streaking: no   Duration:  3 days Progression:  Worsening Chronicity:  New Context: insect bite/sting   Context: not diabetes, not immunosuppression, not injected drug use and not skin injury   Relieved by:  Nothing Worsened by:  Nothing Ineffective treatments:  None tried   Past Medical History:  Diagnosis Date  . Breast cancer ([place]) 2014   Chemo/radiaiton- Rt.  . Breast cancer of upper-outer quadrant of right female breast [facility]) March 2014   T1c,N0,M0; BRCA neg. ER: 60%; PR: 70%, her 2 neu not over expressed.   . Cancer [facility]) 2014   right breast.right wide excision with sentinal node biopsy on 06/21/12  . Lymphedema 2015   Right Arm.  . Personal history of chemotherapy   . Personal history of radiation therapy     Patient Active Problem  List   Diagnosis Date Noted  . Encounter for screening colonoscopy 08/19/2016  . Malignant neoplasm of right breast in female, estrogen receptor positive ([place]) 07/20/2013  . Obesity 11/28/2011    Past Surgical History:  Procedure Laterality Date  . BREAST BIOPSY Right 2014   Positive  . BREAST SURGERY Right 06/21/2012   right wide excision with sentinal node biopsy/axilary dissection.  . COLONOSCOPY WITH PROPOFOL N/A 10/01/2016   Procedure: COLONOSCOPY WITH PROPOFOL;  Surgeon: Robert Bellow, MD;  Location: [facility] ENDOSCOPY;  Service: Endoscopy;  Laterality: N/A;  . STRABISMUS SURGERY      OB History    Gravida Para Term Preterm AB Living   0             SAB TAB Ectopic Multiple Live Births                  Obstetric Comments   Age first period 5       Home Medications    Prior to Admission medications   Medication Sig Start Date End Date Taking? Authorizing Provider  Multiple Vitamins-Calcium (VIACTIV MULTI-VITAMIN PO) Take 2 tablets by mouth daily.    [provider]  mupirocin ointment (BACTROBAN) 2 % Apply 1 application topically 2 (two) times daily. 11/27/16   Frederich Cha, MD  sulfamethoxazole-trimethoprim (BACTRIM DS,SEPTRA DS) 800-160 MG tablet Take 1 tablet by mouth 2 (two) times daily. 11/27/16   Frederich Cha, MD  tamoxifen (NOLVADEX) 20  MG tablet TAKE 1 TABLET BY MOUTH AT NIGHT AT BEDTIME 01/29/16   Lequita Asal, MD    Family History Family History  Problem Relation Age of Onset  . Cancer Paternal Uncle        Lung  . Cancer Cousin        Breast   . Cancer Maternal Aunt        lung  . Diabetes Mother   . Hypertension Father   . Diabetes Father   . Breast cancer Neg Hx     Social History Social History  Substance Use Topics  . Smoking status: Never Smoker  . Smokeless tobacco: Never Used  . Alcohol use No     Allergies   Tape   Review of Systems Review of Systems  Skin: Positive for rash and wound.  All other systems  reviewed and are negative.    Physical Exam Triage Vital Signs ED Triage Vitals  Enc Vitals Group     BP 11/27/16 1634 125/79     Pulse Rate 11/27/16 1634 83     Resp 11/27/16 1634 16     Temp 11/27/16 1634 98.8 F (37.1 C)     Temp Source 11/27/16 1634 Oral     SpO2 11/27/16 1634 100 %     Weight 11/27/16 1632 170 lb (77.1 kg)     Height 11/27/16 1632 '5\' 1"'$  (1.549 m)     Head Circumference --      Peak Flow --      Pain Score --      Pain Loc --      Pain Edu? --      Excl. in [place]? --    No data found.   Updated Vital Signs BP 125/79 (BP Location: Left Arm)   Pulse 83   Temp 98.8 F (37.1 C) (Oral)   Resp 16   Ht '5\' 1"'$  (1.549 m)   Wt 170 lb (77.1 kg)   LMP 07/27/2012   SpO2 100%   BMI 32.12 kg/m   Visual Acuity Right Eye Distance:   Left Eye Distance:   Bilateral Distance:    Right Eye Near:   Left Eye Near:    Bilateral Near:     Physical Exam  Constitutional: She is oriented to person, place, and time. She appears well-developed and well-nourished.  HENT:  Head: Normocephalic and atraumatic.  Right Ear: External ear normal.  Left Ear: External ear normal.  Eyes: Pupils are equal, round, and reactive to light.  Neck: Normal range of motion.  Pulmonary/Chest: Effort normal.  Musculoskeletal: Normal range of motion.  Neurological: She is alert and oriented to person, place, and time.  Skin: Rash noted. There is erythema.     There is redness and swelling of the right forearm tender to palpation the redness surrounds the insect sting occurred on the right forearm nothing that requires incision and drainage at this time.  Psychiatric: She has a normal mood and affect. Her behavior is normal.  Vitals reviewed.    UC Treatments / Results  Labs (all labs ordered are listed, but only abnormal results are displayed) Labs Reviewed - No data to display  EKG  EKG Interpretation None       Radiology No results found.  Procedures Procedures  (including critical care time)  Medications Ordered in UC Medications - No data to display   Initial Impression / Assessment and Plan / UC Course  I have reviewed the triage vital  signs and the nursing notes.  Pertinent labs & imaging results that were available during my care of the patient were reviewed by me and considered in my medical decision making (see chart for details).   will place patient on Septra DS 1 tablet twice a day back pain ointment applied twice a day as well over the sling and site. Follow-up PCP 1-2 weeks if needed. She declined work note since she's gone the medication Toprol    Final Clinical Impressions(s) / UC Diagnoses   Final diagnoses:  Bite or sting by insect with infection    New Prescriptions New Prescriptions   MUPIROCIN OINTMENT (BACTROBAN) 2 %    Apply 1 application topically 2 (two) times daily.   SULFAMETHOXAZOLE-TRIMETHOPRIM (BACTRIM DS,SEPTRA DS) 800-160 MG TABLET    Take 1 tablet by mouth 2 (two) times daily.    Note: This dictation was prepared with Dragon dictation along with smaller phrase technology. Any transcriptional errors that result from this process are unintentional.  Controlled Substance Prescriptions [place] Controlled Substance Registry consulted? Not Applicable   Frederich Cha, MD 11/27/16 [phone]

## 2016-11-27 NOTE — Telephone Encounter (Signed)
Patient informed to see PCP or go to Urgent care, She stated she will go to Urgent care

## 2016-11-27 NOTE — Telephone Encounter (Signed)
   She needs to be on antibiotics.  She should be seen by her PCP.  If not, acute care clinic here.  M

## 2016-11-27 NOTE — Telephone Encounter (Signed)
Patient called and reports she got stung by a wasp on her arm with lymph edema and it now infected. Please advise

## 2016-11-27 NOTE — ED Triage Notes (Signed)
Wasp sting to left arm x2 days ago. HX of breast CA to right breast.

## 2017-01-08 ENCOUNTER — Encounter: Payer: Self-pay | Admitting: Radiation Oncology

## 2017-01-08 ENCOUNTER — Ambulatory Visit
Admission: RE | Admit: 2017-01-08 | Discharge: 2017-01-08 | Disposition: A | Discharge status: Home / Self Care | Payer: 59 | Source: Ambulatory Visit | Attending: Radiation Oncology | Admitting: Radiation Oncology

## 2017-01-08 VITALS — BP 126/91 | HR 69 | Temp 97.8°F | Resp 18 | Wt 165.6 lb

## 2017-01-08 DIAGNOSIS — Z79810 Long term (current) use of selective estrogen receptor modulators (SERMs): Secondary | ICD-10-CM | POA: Diagnosis not present

## 2017-01-08 DIAGNOSIS — Z923 Personal history of irradiation: Secondary | ICD-10-CM | POA: Diagnosis not present

## 2017-01-08 DIAGNOSIS — C50911 Malignant neoplasm of unspecified site of right female breast: Secondary | ICD-10-CM | POA: Insufficient documentation

## 2017-01-08 DIAGNOSIS — Z170 Estrogen receptor positive status [ER+]: Secondary | ICD-10-CM | POA: Insufficient documentation

## 2017-01-08 NOTE — Progress Notes (Signed)
Radiation Oncology Follow up Note  Name: Natalie Gallagher   Date:   01/08/2017 MRN:  [id number] DOB: 12-09-1970    This 46 y.o. female presents to the clinic today for 3/2 year follow-up status post whole breast radiation to her right breast for stage I invasive mammary carcinoma ER/PR positive.  REFERRING PROVIDER: No ref. provider found  HPI: patient is a 46 year old female now out 3/2 years have included whole breast radiation to her right breast for stage I invasive mammary carcinoma ER/PR positive HER-2/neu not overexpressed. She seen today in routine follow-up and is doing well specifically denies breast tenderness cough or bone pain. She continues on tamoxifen without side effect..her last mammogram which I have reviewed was back in May 2018 BI-RADS 2 benign  COMPLICATIONS OF TREATMENT: none  FOLLOW UP COMPLIANCE: keeps appointments   PHYSICAL EXAM:  BP (!) 126/91   Pulse 69   Temp 97.8 F (36.6 C)   Resp 18   Wt 165 lb 9.1 oz (75.1 kg)   LMP 07/27/2012   BMI 31.28 kg/m  Lungs are clear to A&P cardiac examination essentially unremarkable with regular rate and rhythm. No dominant mass or nodularity is noted in either breast in 2 positions examined. Incision is well-healed. No axillary or supraclavicular adenopathy is appreciated. Cosmetic result is excellent. Well-developed well-nourished patient in NAD. HEENT reveals PERLA, EOMI, discs not visualized.  Oral cavity is clear. No oral mucosal lesions are identified. Neck is clear without evidence of cervical or supraclavicular adenopathy. Lungs are clear to A&P. Cardiac examination is essentially unremarkable with regular rate and rhythm without murmur rub or thrill. Abdomen is benign with no organomegaly or masses noted. Motor sensory and DTR levels are equal and symmetric in the upper and lower extremities. Cranial nerves II through XII are grossly intact. Proprioception is intact. No peripheral adenopathy or edema is identified. No  motor or sensory levels are noted. Crude visual fields are within normal range.  RADIOLOGY RESULTS: mammograms reviewed compatible above-stated findings  PLAN: at the present time patient is doing well with no evidence of disease. I'm please were overall progress. She is a rescheduled for follow-up mammogram next year. I will see her back in 1 year and then discontinue follow-up care. She continues on tamoxifen without side effect. Patient knows to call with any concerns.  I would like to take this opportunity to thank you for allowing me to participate in the care of your patient.Armstead Peaks., MD

## 2017-02-03 ENCOUNTER — Encounter: Payer: Self-pay | Admitting: Internal Medicine

## 2017-02-03 ENCOUNTER — Ambulatory Visit (INDEPENDENT_AMBULATORY_CARE_PROVIDER_SITE_OTHER): Payer: 59 | Admitting: Internal Medicine

## 2017-02-03 ENCOUNTER — Other Ambulatory Visit
Admission: RE | Admit: 2017-02-03 | Discharge: 2017-02-03 | Disposition: A | Discharge status: Home / Self Care | Payer: 59 | Source: Ambulatory Visit | Attending: Internal Medicine | Admitting: Internal Medicine

## 2017-02-03 VITALS — BP 110/80 | HR 66 | Ht 61.0 in | Wt 165.4 lb

## 2017-02-03 DIAGNOSIS — Z6831 Body mass index (BMI) 31.0-31.9, adult: Secondary | ICD-10-CM | POA: Diagnosis not present

## 2017-02-03 DIAGNOSIS — C50911 Malignant neoplasm of unspecified site of right female breast: Secondary | ICD-10-CM | POA: Diagnosis not present

## 2017-02-03 DIAGNOSIS — Z170 Estrogen receptor positive status [ER+]: Secondary | ICD-10-CM | POA: Insufficient documentation

## 2017-02-03 DIAGNOSIS — Z0000 Encounter for general adult medical examination without abnormal findings: Secondary | ICD-10-CM | POA: Diagnosis not present

## 2017-02-03 LAB — CBC WITH DIFFERENTIAL/PLATELET
Basophils Absolute: 0.1 10*3/uL (ref 0–0.1)
Basophils Relative: 1 %
EOS ABS: 0.3 10*3/uL (ref 0–0.7)
Eosinophils Relative: 5 %
HCT: 38.7 % (ref 35.0–47.0)
HEMOGLOBIN: 13.2 g/dL (ref 12.0–16.0)
LYMPHS ABS: 2 10*3/uL (ref 1.0–3.6)
LYMPHS PCT: 27 %
MCH: 29.8 pg (ref 26.0–34.0)
MCHC: 34 g/dL (ref 32.0–36.0)
MCV: 87.6 fL (ref 80.0–100.0)
Monocytes Absolute: 0.5 10*3/uL (ref 0.2–0.9)
Monocytes Relative: 7 %
NEUTROS ABS: 4.5 10*3/uL (ref 1.4–6.5)
NEUTROS PCT: 60 %
Platelets: 227 10*3/uL (ref 150–440)
RBC: 4.42 MIL/uL (ref 3.80–5.20)
RDW: 13.1 % (ref 11.5–14.5)
WBC: 7.4 10*3/uL (ref 3.6–11.0)

## 2017-02-03 LAB — POCT URINALYSIS DIPSTICK
Bilirubin, UA: NEGATIVE
GLUCOSE UA: NEGATIVE
Ketones, UA: NEGATIVE
NITRITE UA: NEGATIVE
PH UA: 6 (ref 5.0–8.0)
Protein, UA: NEGATIVE
RBC UA: NEGATIVE
SPEC GRAV UA: 1.015 (ref 1.010–1.025)
UROBILINOGEN UA: 0.2 U/dL

## 2017-02-03 LAB — COMPREHENSIVE METABOLIC PANEL
ALK PHOS: 56 U/L (ref 38–126)
ALT: 20 U/L (ref 14–54)
AST: 20 U/L (ref 15–41)
Albumin: 3.6 g/dL (ref 3.5–5.0)
Anion gap: 7 (ref 5–15)
BUN: 19 mg/dL (ref 6–20)
CALCIUM: 8.9 mg/dL (ref 8.9–10.3)
CO2: 28 mmol/L (ref 22–32)
CREATININE: 0.89 mg/dL (ref 0.44–1.00)
Chloride: 102 mmol/L (ref 101–111)
GFR calc non Af Amer: >60 mL/min (ref >60)
GLUCOSE: 86 mg/dL (ref 65–99)
Potassium: 3.9 mmol/L (ref 3.5–5.1)
SODIUM: 137 mmol/L (ref 135–145)
Total Bilirubin: 0.3 mg/dL (ref 0.3–1.2)
Total Protein: 7.3 g/dL (ref 6.5–8.1)

## 2017-02-03 LAB — LIPID PANEL
Cholesterol: 130 mg/dL (ref 0–200)
HDL: 45 mg/dL (ref >40)
LDL CALC: 59 mg/dL (ref 0–99)
TRIGLYCERIDES: 129 mg/dL (ref <150)
Total CHOL/HDL Ratio: 2.9 RATIO
VLDL: 26 mg/dL (ref 0–40)

## 2017-02-03 LAB — TSH: TSH: 2.546 u[IU]/mL (ref 0.350–4.500)

## 2017-02-03 LAB — HEMOGLOBIN A1C
HEMOGLOBIN A1C: 5.1 % (ref 4.8–5.6)
MEAN PLASMA GLUCOSE: 99.67 mg/dL

## 2017-02-03 NOTE — Progress Notes (Signed)
Date:  02/03/2017   Name:  Natalie Gallagher   DOB:  [birth year]   MRN:  [id number]   Chief Complaint: Annual Exam (Breast exam and pap. Not fasting. ) Natalie Gallagher is a 46 y.o. female who presents today for her Complete Annual Exam. She feels well. She reports exercising regularly with dance, gym and boot camp. She reports she is sleeping well.   Breast cancer - followed by Oncology.  On Tamoxifen.  Last mammogram May 2018.  Should finish tamoxifen in late 2019.   Review of Systems  Constitutional: Negative for chills, fatigue and fever.  HENT: Negative for congestion, hearing loss, tinnitus, trouble swallowing and voice change.   Eyes: Negative for visual disturbance.  Respiratory: Negative for cough, chest tightness, shortness of breath and wheezing.   Cardiovascular: Negative for chest pain, palpitations and leg swelling.  Gastrointestinal: Negative for abdominal pain, constipation, diarrhea and vomiting.  Endocrine: Negative for polydipsia and polyuria.  Genitourinary: Negative for dysuria, frequency, genital sores, vaginal bleeding and vaginal discharge.  Musculoskeletal: Negative for arthralgias, gait problem and joint swelling.  Skin: Negative for color change and rash.  Neurological: Negative for dizziness, tremors, light-headedness and headaches.  Hematological: Negative for adenopathy. Does not bruise/bleed easily.  Psychiatric/Behavioral: Negative for dysphoric mood and sleep disturbance. The patient is not nervous/anxious.     Patient Active Problem List   Diagnosis Date Noted  . Malignant neoplasm of right breast in female, estrogen receptor positive ([place]) 07/20/2013  . Obesity 11/28/2011    Prior to Admission medications   Medication Sig Start Date End Date Taking? Authorizing Provider  Multiple Vitamins-Calcium (VIACTIV MULTI-VITAMIN PO) Take 2 tablets by mouth daily.   Yes [provider]  tamoxifen (NOLVADEX) 20 MG tablet TAKE 1 TABLET BY MOUTH AT NIGHT AT  BEDTIME 01/29/16  Yes Lequita Asal, MD    Allergies  Allergen Reactions  . Tape     The patient showed a cutaneous reaction to Telfa applied beneath a Tegaderm dressing. Telfa appears to be the primary offensive material.    Past Surgical History:  Procedure Laterality Date  . BREAST BIOPSY Right 2014   Positive  . BREAST SURGERY Right 06/21/2012   right wide excision with sentinal node biopsy/axilary dissection.  . COLONOSCOPY WITH PROPOFOL N/A 10/01/2016   Procedure: COLONOSCOPY WITH PROPOFOL;  Surgeon: Robert Bellow, MD;  Location: [facility] ENDOSCOPY;  Service: Endoscopy;  Laterality: N/A;  . STRABISMUS SURGERY      Social History  Substance Use Topics  . Smoking status: Never Smoker  . Smokeless tobacco: Never Used  . Alcohol use No     Medication list has been reviewed and updated.  PHQ 2/9 Scores 01/08/2017 12/21/2014  PHQ - 2 Score 0 0    Physical Exam  Constitutional: She is oriented to person, place, and time. She appears well-developed and well-nourished. No distress.  HENT:  Head: Normocephalic and atraumatic.  Right Ear: Tympanic membrane and ear canal normal.  Left Ear: Tympanic membrane and ear canal normal.  Nose: Right sinus exhibits no maxillary sinus tenderness. Left sinus exhibits no maxillary sinus tenderness.  Mouth/Throat: Uvula is midline and oropharynx is clear and moist.  Eyes: Conjunctivae and EOM are normal. Right eye exhibits no discharge. Left eye exhibits no discharge. No scleral icterus.  Neck: Normal range of motion. Carotid bruit is not present. No erythema present. No thyromegaly present.  Cardiovascular: Normal rate, regular rhythm, normal heart sounds and normal pulses.   Pulmonary/Chest: Effort normal.  No respiratory distress. She has no wheezes. Right breast exhibits skin change (with tissue thickening and scar formation). Right breast exhibits no mass, no nipple discharge and no tenderness. Left breast exhibits no mass, no nipple  discharge, no skin change and no tenderness.    Abdominal: Soft. Bowel sounds are normal. There is no hepatosplenomegaly. There is no tenderness. There is no CVA tenderness.  Musculoskeletal: Normal range of motion.  Lymphadenopathy:    She has no cervical adenopathy.    She has no axillary adenopathy.  Neurological: She is alert and oriented to person, place, and time. She has normal reflexes. No cranial nerve deficit or sensory deficit.  Skin: Skin is warm, dry and intact. No rash noted.  Psychiatric: She has a normal mood and affect. Her speech is normal and behavior is normal. Thought content normal.  Nursing note and vitals reviewed.   BP 110/80   Pulse 66   Ht 5\' 1"  (1.549 m)   Wt 165 lb 6.4 oz (75 kg)   LMP 07/27/2012   SpO2 99%   BMI 31.25 kg/m   Assessment and Plan: 1. Annual physical exam - Pap IG and HPV (high risk) DNA detection - Lipid panel - POCT urinalysis dipstick  2. BMI 31.0-31.9,adult Work on diet and exercise - TSH - Hemoglobin A1c  3. Malignant neoplasm of right breast in female, estrogen receptor positive, unspecified site of breast ([place]) Followed by oncology - will get labs in advance of oncology appt - CBC with Differential/Platelet - Comprehensive metabolic panel - Cancer Antigen 27.29   No orders of the defined types were placed in this encounter.   Partially dictated using Editor, commissioning. Any errors are unintentional.  Halina Maidens, MD [facility]  02/03/2017

## 2017-02-04 LAB — CANCER ANTIGEN 27.29: CAN 27.29: 15.1 U/mL (ref 0.0–38.6)

## 2017-02-05 LAB — PAP IG AND HPV HIGH-RISK
HPV, HIGH-RISK: NEGATIVE
PAP SMEAR COMMENT: 0

## 2017-02-20 ENCOUNTER — Other Ambulatory Visit: Payer: Self-pay | Admitting: *Deleted

## 2017-02-20 DIAGNOSIS — C50911 Malignant neoplasm of unspecified site of right female breast: Secondary | ICD-10-CM

## 2017-02-20 DIAGNOSIS — Z170 Estrogen receptor positive status [ER+]: Principal | ICD-10-CM

## 2017-02-23 ENCOUNTER — Encounter: Payer: Self-pay | Admitting: Hematology and Oncology

## 2017-02-23 ENCOUNTER — Other Ambulatory Visit: Payer: Self-pay | Admitting: *Deleted

## 2017-02-23 ENCOUNTER — Inpatient Hospital Stay: Payer: 59

## 2017-02-23 ENCOUNTER — Inpatient Hospital Stay: Payer: 59 | Attending: Hematology and Oncology | Admitting: Hematology and Oncology

## 2017-02-23 VITALS — BP 121/78 | HR 62 | Temp 97.6°F | Resp 18 | Wt 163.2 lb

## 2017-02-23 DIAGNOSIS — Z923 Personal history of irradiation: Secondary | ICD-10-CM | POA: Diagnosis not present

## 2017-02-23 DIAGNOSIS — Z9221 Personal history of antineoplastic chemotherapy: Secondary | ICD-10-CM

## 2017-02-23 DIAGNOSIS — Z803 Family history of malignant neoplasm of breast: Secondary | ICD-10-CM | POA: Diagnosis not present

## 2017-02-23 DIAGNOSIS — Z170 Estrogen receptor positive status [ER+]: Principal | ICD-10-CM

## 2017-02-23 DIAGNOSIS — Z79810 Long term (current) use of selective estrogen receptor modulators (SERMs): Secondary | ICD-10-CM

## 2017-02-23 DIAGNOSIS — C50911 Malignant neoplasm of unspecified site of right female breast: Secondary | ICD-10-CM

## 2017-02-23 DIAGNOSIS — Z801 Family history of malignant neoplasm of trachea, bronchus and lung: Secondary | ICD-10-CM | POA: Diagnosis not present

## 2017-02-23 DIAGNOSIS — I890 Lymphedema, not elsewhere classified: Secondary | ICD-10-CM | POA: Diagnosis not present

## 2017-02-23 DIAGNOSIS — C50411 Malignant neoplasm of upper-outer quadrant of right female breast: Secondary | ICD-10-CM | POA: Insufficient documentation

## 2017-02-23 NOTE — Progress Notes (Signed)
[facility] day:  02/23/17  Chief Complaint: Natalie Gallagher is a 46 y.o. female with stage IA right breast cancer who is seen for 6 month assessment.  HPI: The patient was last seen in the medical oncology clinic on 08/21/2016.  At that time, she noted intermittent fatigue  She had mild vasomotor symptoms.  Exam revealed moderate fibrocystic changes and right upper extremity lymphedema.  She continued tamoxifen.  During the interim, patient is doing well. She denies any physical complaints today. Patient denies any increase in the previously reported vasomotor symptoms.  Patient has not experienced any B symptoms or interval infections. Patient is eating well. She is intentionally trying to lose weight on Weight Watchers. Patient has lost 16 pounds since May.   Patient continues on her Tamoxifen without any perceived side effects.  She denies any breast concerns.   Past Medical History:  Diagnosis Date  . Breast cancer ([place]) 2014   Chemo/radiaiton- Rt.  . Breast cancer of upper-outer quadrant of right female breast [facility]) March 2014   T1c,N0,M0; BRCA neg. ER: 60%; PR: 70%, her 2 neu not over expressed.   . Cancer [facility]) 2014   right breast.right wide excision with sentinal node biopsy on 06/21/12  . Lymphedema 2015   Right Arm.  . Personal history of chemotherapy   . Personal history of radiation therapy     Past Surgical History:  Procedure Laterality Date  . BREAST BIOPSY Right 2014   Positive  . BREAST SURGERY Right 06/21/2012   right wide excision with sentinal node biopsy/axilary dissection.  . COLONOSCOPY WITH PROPOFOL N/A 10/01/2016   Performed by Robert Bellow, MD at [facility]  . STRABISMUS SURGERY      Family History  Problem Relation Age of Onset  . Cancer Paternal Uncle        Lung  . Cancer Cousin        Breast   . Cancer Maternal Aunt        lung  . Diabetes Mother   . Hypertension Father   . Diabetes Father    . Breast cancer Neg Hx     Social History:  reports that  has never smoked. she has never used smokeless tobacco. She reports that she does not drink alcohol or use drugs.  She is going to the gym.  She lives in [place].  She is losing weight on Weight Watchers.  The patient is alone today.  Allergies:  Allergies  Allergen Reactions  . Tape     The patient showed a cutaneous reaction to Telfa applied beneath a Tegaderm dressing. Telfa appears to be the primary offensive material.    Current Medications: Current Outpatient Medications  Medication Sig Dispense Refill  . Multiple Vitamins-Calcium (VIACTIV MULTI-VITAMIN PO) Take 2 tablets by mouth daily.    . tamoxifen (NOLVADEX) 20 MG tablet TAKE 1 TABLET BY MOUTH AT NIGHT AT BEDTIME 30 tablet 3   No current facility-administered medications for this visit.     Review of Systems:  GENERAL:  Feels "good".  No fevers or sweats.  Weight down 16 pounds; intentional. PERFORMANCE STATUS (ECOG):  0 HEENT:  No visual changes, runny nose, sore throat, mouth sores or tenderness. Lungs: No shortness of breath or cough.  No hemoptysis. Cardiac:  No chest pain, palpitations, orthopnea, or PND. GI:  No nausea, vomiting, diarrhea, constipation, melena or hematochezia. GU:  No urgency, frequency, dysuria, or hematuria. Musculoskeletal:  No back  pain.  No joint pain.  No muscle tenderness. Extremities:  Lymphedema.  No pain or lower extremity swelling. Skin:  No rashes or skin changes. Neuro:  No headache, numbness or weakness, balance or coordination issues. Endocrine: Hot flashes.  No diabetes, thyroid issues, or night sweats. Psych:  No mood changes, depression or anxiety. Pain:  No focal pain. Review of systems:  All other systems reviewed and found to be negative.  Physical Exam: Blood pressure 121/78, pulse 62, temperature 97.6 F (36.4 C), temperature source Tympanic, resp. rate 18, weight 163 lb 3 oz (74 kg), last menstrual period  07/27/2012. GENERAL:  Well developed, well nourished, woman sitting comfortably in the exam room in no acute distress. MENTAL STATUS:  Alert and oriented to person, place and time. HEAD:  Curly black hair.  Normocephalic, atraumatic, face symmetric, no Cushingoid features. EYES:  Brown eyes.  Pupils equal round and reactive to light and accomodation.  No conjunctivitis or scleral icterus. ENT:  Oropharynx clear without lesion.  Tongue normal. Mucous membranes moist.  RESPIRATORY:  Clear to auscultation without rales, wheezes or rhonchi. CARDIOVASCULAR:  Regular rate and rhythm without murmur, rub or gallop. BREAST:  Large breasts.  Right breast with moderate post-operative and post radiation changes.  Significant/extensive fibrocystic changes left breast.  No discrete masses, skin changes or nipple discharge.   ABDOMEN:  Soft, non-tender, with active bowel sounds, and no appreciable hepatosplenomegaly.  No masses. SKIN:  No rashes, ulcers or lesions. EXTREMITIES:  Mild lymphedema right upper extremity.  No edema, no skin discoloration or tenderness.  No palpable cords. LYMPH NODES: No palpable cervical, supraclavicular, axillary or inguinal adenopathy  NEUROLOGICAL: Unremarkable. PSYCH:  Appropriate.   No visits with results within 3 Day(s) from this visit.  Latest known visit with results is:  Office Visit on 02/03/2017  Component Date Value Ref Range Status  . WBC 02/03/2017 7.4  3.6 - 11.0 K/uL Final  . RBC 02/03/2017 4.42  3.80 - 5.20 MIL/uL Final  . Hemoglobin 02/03/2017 13.2  12.0 - 16.0 g/dL Final  . HCT 02/03/2017 38.7  35.0 - 47.0 % Final  . MCV 02/03/2017 87.6  80.0 - 100.0 fL Final  . MCH 02/03/2017 29.8  26.0 - 34.0 pg Final  . MCHC 02/03/2017 34.0  32.0 - 36.0 g/dL Final  . RDW 02/03/2017 13.1  11.5 - 14.5 % Final  . Platelets 02/03/2017 227  150 - 440 K/uL Final  . Neutrophils Relative % 02/03/2017 60  % Final  . Neutro Abs 02/03/2017 4.5  1.4 - 6.5 K/uL Final  .  Lymphocytes Relative 02/03/2017 27  % Final  . Lymphs Abs 02/03/2017 2.0  1.0 - 3.6 K/uL Final  . Monocytes Relative 02/03/2017 7  % Final  . Monocytes Absolute 02/03/2017 0.5  0.2 - 0.9 K/uL Final  . Eosinophils Relative 02/03/2017 5  % Final  . Eosinophils Absolute 02/03/2017 0.3  0 - 0.7 K/uL Final  . Basophils Relative 02/03/2017 1  % Final  . Basophils Absolute 02/03/2017 0.1  0 - 0.1 K/uL Final  . Sodium 02/03/2017 137  135 - 145 mmol/L Final  . Potassium 02/03/2017 3.9  3.5 - 5.1 mmol/L Final  . Chloride 02/03/2017 102  101 - 111 mmol/L Final  . CO2 02/03/2017 28  22 - 32 mmol/L Final  . Glucose, Bld 02/03/2017 86  65 - 99 mg/dL Final  . BUN 02/03/2017 19  6 - 20 mg/dL Final  . Creatinine, Ser 02/03/2017 0.89  0.44 -  1.00 mg/dL Final  . Calcium 02/03/2017 8.9  8.9 - 10.3 mg/dL Final  . Total Protein 02/03/2017 7.3  6.5 - 8.1 g/dL Final  . Albumin 02/03/2017 3.6  3.5 - 5.0 g/dL Final  . AST 02/03/2017 20  15 - 41 U/L Final  . ALT 02/03/2017 20  14 - 54 U/L Final  . Alkaline Phosphatase 02/03/2017 56  38 - 126 U/L Final  . Total Bilirubin 02/03/2017 0.3  0.3 - 1.2 mg/dL Final  . GFR calc non Af Amer 02/03/2017 >60  >60 mL/min Final  . GFR calc Af Amer 02/03/2017 >60  >60 mL/min Final   Comment: (NOTE) The eGFR has been calculated using the CKD EPI equation. This calculation has not been validated in all clinical situations. eGFR's persistently <60 mL/min signify possible Chronic Kidney Disease.   . Anion gap 02/03/2017 7  5 - 15 Final  . DIAGNOSIS: 02/03/2017 Comment   Final   NEGATIVE FOR INTRAEPITHELIAL LESION AND MALIGNANCY.  Marland Kitchen Specimen adequacy: 02/03/2017 Comment   Final   Comment: Satisfactory for evaluation. Endocervical and/or squamous metaplastic cells (endocervical component) are present.   . Clinician Provided ICD10 02/03/2017 Comment   Final   Z00.00  . Performed by: 02/03/2017 Comment   Final   Chrisandra Netters, Cytotechnologist (ASCP)  . PAP Smear Comment  02/03/2017 .   Final  . Note: 02/03/2017 Comment   Final   Comment: The Pap smear is a screening test designed to aid in the detection of premalignant and malignant conditions of the uterine cervix.  It is not a diagnostic procedure and should not be used as the sole means of detecting cervical cancer.  Both false-positive and false-negative reports do occur.   . Test Methodology 02/03/2017 Comment   Final   Comment: This liquid based ThinPrep(R) pap test was screened with the use of an image guided system.   . HPV, high-risk 02/03/2017 Negative  Negative Final   Comment: This high-risk HPV test detects thirteen high-risk types (16/18/31/33/35/39/45/51/52/56/58/59/68) without differentiation.   . Cholesterol 02/03/2017 130  0 - 200 mg/dL Final  . Triglycerides 02/03/2017 129  <150 mg/dL Final  . HDL 02/03/2017 45  >40 mg/dL Final  . Total CHOL/HDL Ratio 02/03/2017 2.9  RATIO Final  . VLDL 02/03/2017 26  0 - 40 mg/dL Final  . LDL Cholesterol 02/03/2017 59  0 - 99 mg/dL Final   Comment:        Total Cholesterol/HDL:CHD Risk Coronary Heart Disease Risk Table                     Men   Women  1/2 Average Risk   3.4   3.3  Average Risk       5.0   4.4  2 X Average Risk   9.6   7.1  3 X Average Risk  23.4   11.0        Use the calculated Patient Ratio above and the CHD Risk Table to determine the patient's CHD Risk.        ATP III CLASSIFICATION (LDL):  <100     mg/dL   Optimal  100-129  mg/dL   Near or Above                    Optimal  130-159  mg/dL   Borderline  160-189  mg/dL   High  >190     mg/dL   Very High   . TSH 02/03/2017  2.546  0.350 - 4.500 uIU/mL Final   Performed by a 3rd Generation assay with a functional sensitivity of <=0.01 uIU/mL.  . Color, UA 02/03/2017 orange   Final  . Clarity, UA 02/03/2017 cloudy   Final  . Glucose, UA 02/03/2017 neg   Final  . Bilirubin, UA 02/03/2017 neg   Final  . Ketones, UA 02/03/2017 neg   Final  . Spec Grav, UA 02/03/2017  1.015  1.010 - 1.025 Final  . Blood, UA 02/03/2017 neg   Final  . pH, UA 02/03/2017 6.0  5.0 - 8.0 Final  . Protein, UA 02/03/2017 neg   Final  . Urobilinogen, UA 02/03/2017 0.2  0.2 or 1.0 E.U./dL Final  . Nitrite, UA 02/03/2017 neg   Final  . Leukocytes, UA 02/03/2017 Trace* Negative Final  . Hgb A1c MFr Bld 02/03/2017 5.1  4.8 - 5.6 % Final   Comment: (NOTE) Pre diabetes:          5.7%-6.4% Diabetes:              >6.4% Glycemic control for   <7.0% adults with diabetes   . Mean Plasma Glucose 02/03/2017 99.67  mg/dL Final   Performed at [facility] [address] [place], [place] [address]  . CA 27.29 02/03/2017 15.1  0.0 - 38.6 U/mL Final   Comment: (NOTE) Bayer Centaur/ACS methodology Performed At: [facility] [address] [place], [place] [id number] Lindon Romp MD SK:[id number]     Assessment:  Natalie Gallagher is a 46 y.o. female with stage IA right breast invasive mammary carcinoma status post wide excision and axillary lymph node dissection on 06/21/2012.  Pathology revealed a 1.7 cm grade III ductal carcinoma.  Thirteen lymph nodes were negative.  Tumor was ER/PR positive and HER-2/neu negative. BRCA 1 and 2 testing was negative.  Oncotype DX was 29 (high) which translates into a distant recurrence of 19% at 10 yrs with tamoxifen alone.  The patient received 4 cycles of Adriamycin and Cytoxan (08/17/2012 - 10/19/2012) followed by 12 weeks of Taxol (11/19/2012 - 01/25/2013).  She completed local radiation on 04/20/2013.  She has been on tamoxifen since 04/21/2013.    CA27.29 has been followed: 17 on 01/12/2015, 18.6 on 07/27/2015, 14.9 on 01/28/2016, 13.9 on 08/21/2016, and 15.1 on 02/03/2017.  Bilateral mammogram on 08/11/2016 revealed no evidence of malignancy.   Symptomatically, she feels good.  She denies any physical complaints.  Exam reveals significant post-operative and post radiation changes in the right breast and significant/extensive  fibrocystic changes in the left breast.  Labs are normal.   Plan: 1. Review labs from 02/03/2017.  CBC, CMP, CA27.29 are normal. 2.  Bilateral mammogram on 08/11/2017. 3.  Continue tamoxifen as previously prescribed.  4.  Discuss plan for yearly breast MRI secondary to significant fibrocystic changes and post treatment changes.  Exam difficult because of extensive changes. 5.  Schedule bilateral breast MRI with and without contrast.   6.  Discuss eye exam and pelvic exam; no issues.  7.  RTC in 6 months for MD assessment and labs (CBC with diff, CMP, CA27.29).   Honor Loh, NP  02/23/2017, 11:17 AM   I saw and evaluated the patient, participating in the key portions of the service and reviewing pertinent diagnostic studies and records.  I reviewed the nurse practitioner's note and agree with the findings and the plan.  The assessment and plan were discussed with the patient.  Additional diagnostic studies of a breast MRI  are needed to adequately assess her breasts and would change the clinical management.  A few questions were asked by the patient and answered.   Nolon Stalls, MD 02/23/2017,11:17 AM

## 2017-02-23 NOTE — Progress Notes (Signed)
In today for 6 month follow up, pt denies any difficulties, verbalized no concerns.

## 2017-03-05 ENCOUNTER — Ambulatory Visit (HOSPITAL_COMMUNITY): Admission: RE | Admit: 2017-03-05 | Payer: 59 | Source: Ambulatory Visit

## 2017-03-11 ENCOUNTER — Ambulatory Visit (HOSPITAL_COMMUNITY): Admission: RE | Admit: 2017-03-11 | Payer: 59 | Source: Ambulatory Visit

## 2017-03-20 ENCOUNTER — Ambulatory Visit (HOSPITAL_COMMUNITY): Payer: 59

## 2017-03-27 ENCOUNTER — Other Ambulatory Visit: Payer: Self-pay | Admitting: Urgent Care

## 2017-03-27 ENCOUNTER — Encounter (HOSPITAL_COMMUNITY): Payer: Self-pay

## 2017-03-27 ENCOUNTER — Ambulatory Visit (HOSPITAL_COMMUNITY): Payer: 59

## 2017-03-27 ENCOUNTER — Telehealth: Payer: Self-pay | Admitting: *Deleted

## 2017-03-27 ENCOUNTER — Ambulatory Visit (HOSPITAL_COMMUNITY)
Admission: RE | Admit: 2017-03-27 | Discharge: 2017-03-27 | Disposition: A | Discharge status: Home / Self Care | Payer: 59 | Source: Ambulatory Visit | Attending: Urgent Care | Admitting: Urgent Care

## 2017-03-27 DIAGNOSIS — C50911 Malignant neoplasm of unspecified site of right female breast: Secondary | ICD-10-CM | POA: Insufficient documentation

## 2017-03-27 DIAGNOSIS — Z170 Estrogen receptor positive status [ER+]: Secondary | ICD-10-CM | POA: Insufficient documentation

## 2017-03-27 MED ORDER — ONDANSETRON HCL 4 MG PO TABS: 4.0000 mg | ORAL_TABLET | Freq: Three times a day (TID) | ORAL | 0 refills | Status: DC | PRN

## 2017-03-27 MED ORDER — GADOBENATE DIMEGLUMINE 529 MG/ML IV SOLN
15.0000 mL | Freq: Once | INTRAVENOUS | Status: AC | PRN
  Administered 2017-03-27: 15 mL via INTRAVENOUS

## 2017-03-27 NOTE — Telephone Encounter (Signed)
Had to reschedule her MRI of breast secondary to nausea and not being able to complete it this morning. Asking for nausea medicine prior to next appointment 04/09/17. Please advise

## 2017-03-27 NOTE — Telephone Encounter (Signed)
Patient informed of prescription sent for nausea

## 2017-03-27 NOTE — Telephone Encounter (Signed)
I will do some Ondansetron for her. I entered and sent a Rx for Ondansetron 4mg  PO q8 PRN. Ask her to let us know if that doesn't work for her and we can look into other options.  Gaspar Bidding

## 2017-03-28 ENCOUNTER — Ambulatory Visit (HOSPITAL_COMMUNITY): Payer: 59

## 2017-04-09 ENCOUNTER — Ambulatory Visit (HOSPITAL_COMMUNITY)
Admission: RE | Admit: 2017-04-09 | Discharge: 2017-04-09 | Disposition: A | Discharge status: Home / Self Care | Payer: 59 | Source: Ambulatory Visit | Attending: Urgent Care | Admitting: Urgent Care

## 2017-04-09 DIAGNOSIS — C50911 Malignant neoplasm of unspecified site of right female breast: Secondary | ICD-10-CM | POA: Diagnosis present

## 2017-04-09 DIAGNOSIS — Z170 Estrogen receptor positive status [ER+]: Secondary | ICD-10-CM | POA: Diagnosis present

## 2017-04-09 MED ORDER — GADOBENATE DIMEGLUMINE 529 MG/ML IV SOLN
15.0000 mL | Freq: Once | INTRAVENOUS | Status: AC | PRN
  Administered 2017-04-09: 15 mL via INTRAVENOUS

## 2017-05-04 ENCOUNTER — Telehealth: Payer: Self-pay

## 2017-05-04 NOTE — Telephone Encounter (Signed)
Patient called stating she called a week ago and spoke with somebody about getting a letter from Dr Army Melia. Wants a letter for weight watchers stating that she is okay to get to weight of 140. Informed her Dr Army Melia stated a healthy weight for her height would be 110-130. Weight watchers says basically the same at 101-132. She stated she does not want to be that small. I informed her if she wants a letter then she will need to come to a [facility] to discuss weight loss letter with Dr Army Melia. She stated " I'm not coming in there again, I was just there in October." Informed her we can't give a letter without seeing her to discuss it first.  She verbalized understanding.

## 2017-05-22 ENCOUNTER — Other Ambulatory Visit: Payer: Self-pay | Admitting: Hematology and Oncology

## 2017-08-13 ENCOUNTER — Ambulatory Visit
Admission: RE | Admit: 2017-08-13 | Discharge: 2017-08-13 | Disposition: A | Discharge status: Home / Self Care | Payer: 59 | Source: Ambulatory Visit | Attending: Urgent Care | Admitting: Urgent Care

## 2017-08-13 DIAGNOSIS — C50911 Malignant neoplasm of unspecified site of right female breast: Secondary | ICD-10-CM | POA: Insufficient documentation

## 2017-08-13 DIAGNOSIS — Z170 Estrogen receptor positive status [ER+]: Secondary | ICD-10-CM | POA: Diagnosis present

## 2017-08-13 DIAGNOSIS — Z1231 Encounter for screening mammogram for malignant neoplasm of breast: Secondary | ICD-10-CM | POA: Diagnosis not present

## 2017-08-16 NOTE — Progress Notes (Signed)
[facility] day:  08/17/17  Chief Complaint: Natalie Gallagher is a 47 y.o. female with stage IA right breast cancer who is seen for 6 month assessment on tamoxifen.   HPI: The patient was last seen in the medical oncology clinic on 02/23/2017.  At that time, patient was doing well. Vasomotor symptoms were stable. She was intentionally trying to lose weight on Weight Watchers plan. She had lost 16 pounds in the preceding 6 months. Exam revealed significant post-operative and post radiation changes in the RIGHT breast and significant/extensive fibrocystic changes in the LEFT breast.  Labs were normal.   Patient called into the clinic on 03/27/2017 advising that she had to reschedule her breast MRI due to nausea while on the table. Rx was sent in for ondansetron '4mg'$  q8h PRN. Exam was rescheduled for 04/09/2017.   Patient had MRI of her breasts on 04/09/2017 that demonstrated no evidence of malignancy.   Routine mammogram done on 08/13/2017 revealing no mammographic evidence of malignancy in either breast. There were stable post lumpectomy changes noted in the RIGHT breast, including a far posterior seroma.   In the interim, patient is doing well today. There are no acute concerns. Patient denies B symptoms and interval infections.  Patient does not verbalize any concerns with regards to her breasts today. Patient is eating well. Weight has intentionally decreased by 9 pounds. Patient denies pain in the clinic today.   She denies pain in the clinic.    Past Medical History:  Diagnosis Date  . Breast cancer ([place]) 2014   Chemo/radiaiton- Rt.  . Breast cancer of upper-outer quadrant of right female breast [facility]) March 2014   T1c,N0,M0; BRCA neg. ER: 60%; PR: 70%, her 2 neu not over expressed.   . Cancer [facility]) 2014   right breast.right wide excision with sentinal node biopsy on 06/21/12  . Lymphedema 2015   Right Arm.  . Personal history of chemotherapy   .  Personal history of radiation therapy     Past Surgical History:  Procedure Laterality Date  . BREAST BIOPSY Right 2014   Positive  . BREAST LUMPECTOMY Right 2014  . BREAST SURGERY Right 06/21/2012   right wide excision with sentinal node biopsy/axilary dissection.  . COLONOSCOPY WITH PROPOFOL N/A 10/01/2016   Procedure: COLONOSCOPY WITH PROPOFOL;  Surgeon: Robert Bellow, MD;  Location: [facility] ENDOSCOPY;  Service: Endoscopy;  Laterality: N/A;  . STRABISMUS SURGERY      Family History  Problem Relation Age of Onset  . Cancer Paternal Uncle        Lung  . Cancer Cousin        Breast   . Cancer Maternal Aunt        lung  . Diabetes Mother   . Hypertension Father   . Diabetes Father   . Breast cancer Neg Hx     Social History:  reports that she has never smoked. She has never used smokeless tobacco. She reports that she does not drink alcohol or use drugs.  She is going to the gym.  She lives in [place].  She is losing weight on Weight Watchers.  The patient is alone today.  Allergies:  Allergies  Allergen Reactions  . Tape     The patient showed a cutaneous reaction to Telfa applied beneath a Tegaderm dressing. Telfa appears to be the primary offensive material.    Current Medications: Current Outpatient Medications  Medication Sig Dispense Refill  .  Multiple Vitamins-Calcium (VIACTIV MULTI-VITAMIN PO) Take 2 tablets by mouth daily.    . tamoxifen (NOLVADEX) 20 MG tablet TAKE 1 TABLET BY MOUTH AT NIGHT AT BEDTIME 90 tablet 3   No current facility-administered medications for this visit.     Review of Systems  Constitutional: Positive for weight loss (intentional). Negative for diaphoresis, fever and malaise/fatigue.       Feels good.  HENT: Negative.  Negative for congestion, hearing loss, nosebleeds, sinus pain and sore throat.   Eyes: Negative.  Negative for blurred vision, double vision, discharge and redness.  Respiratory: Negative.  Negative for cough,  hemoptysis, sputum production and shortness of breath.   Cardiovascular: Negative.  Negative for chest pain, palpitations, orthopnea, leg swelling and PND.  Gastrointestinal: Negative.  Negative for abdominal pain, blood in stool, constipation, diarrhea, melena, nausea and vomiting.  Genitourinary: Negative.  Negative for dysuria, frequency, hematuria and urgency.  Musculoskeletal: Negative.  Negative for back pain, falls, joint pain and myalgias.  Skin: Negative.  Negative for itching and rash.  Neurological: Negative.  Negative for dizziness, tremors, focal weakness, weakness and headaches.  Endo/Heme/Allergies: Negative.  Does not bruise/bleed easily.       Vasomotor symptoms; stable  Psychiatric/Behavioral: Negative for depression, memory loss and suicidal ideas. The patient is not nervous/anxious and does not have insomnia.   All other systems reviewed and are negative.  Physical Exam: Blood pressure 134/88, pulse 67, temperature (!) 97.2 F (36.2 C), temperature source Tympanic, resp. rate 18, weight 154 lb 7 oz (70.1 kg), last menstrual period 07/27/2012. GENERAL:  Well developed, well nourished, woman sitting comfortably in the exam room in no acute distress. MENTAL STATUS:  Alert and oriented to person, place and time. HEAD:  Curly black hair.  Normocephalic, atraumatic, face symmetric, no Cushingoid features. EYES:  Brown eyes.  Pupils equal round and reactive to light and accomodation.  No conjunctivitis or scleral icterus. ENT:  Oropharynx clear without lesion.  Tongue normal. Mucous membranes moist.  RESPIRATORY:  Clear to auscultation without rales, wheezes or rhonchi. CARDIOVASCULAR:  Regular rate and rhythm without murmur, rub or gallop. BREAST:  Large breasts.  Right breast with moderate mass-like post-operative and post radiation changes (no change).  No skin changes or nipple discharge.  Significant left breast fibrocystic changes.  Left breast without discrete masses, skin  changes or nipple discharge. ABDOMEN:  Soft, non-tender, with active bowel sounds, and no hepatosplenomegaly.  No masses. SKIN:  No rashes, ulcers or lesions. EXTREMITIES: No edema, no skin discoloration or tenderness.  No palpable cords. LYMPH NODES: No palpable cervical, supraclavicular, axillary or inguinal adenopathy  NEUROLOGICAL: Unremarkable. PSYCH:  Appropriate.    Appointment on 08/17/2017  Component Date Value Ref Range Status  . Sodium 08/17/2017 138  135 - 145 mmol/L Final  . Potassium 08/17/2017 3.9  3.5 - 5.1 mmol/L Final  . Chloride 08/17/2017 104  101 - 111 mmol/L Final  . CO2 08/17/2017 24  22 - 32 mmol/L Final  . Glucose, Bld 08/17/2017 103* 65 - 99 mg/dL Final  . BUN 08/17/2017 15  6 - 20 mg/dL Final  . Creatinine, Ser 08/17/2017 0.86  0.44 - 1.00 mg/dL Final  . Calcium 08/17/2017 9.1  8.9 - 10.3 mg/dL Final  . Total Protein 08/17/2017 7.4  6.5 - 8.1 g/dL Final  . Albumin 08/17/2017 3.7  3.5 - 5.0 g/dL Final  . AST 08/17/2017 27  15 - 41 U/L Final  . ALT 08/17/2017 22  14 - 54 U/L  Final  . Alkaline Phosphatase 08/17/2017 52  38 - 126 U/L Final  . Total Bilirubin 08/17/2017 0.3  0.3 - 1.2 mg/dL Final  . GFR calc non Af Amer 08/17/2017 >60  >60 mL/min Final  . GFR calc Af Amer 08/17/2017 >60  >60 mL/min Final   Comment: (NOTE) The eGFR has been calculated using the CKD EPI equation. This calculation has not been validated in all clinical situations. eGFR's persistently <60 mL/min signify possible Chronic Kidney Disease.   Georgiann Hahn gap 08/17/2017 10  5 - 15 Final   Performed at [facility], [address] [place], [place] [address]  . WBC 08/17/2017 5.6  3.6 - 11.0 K/uL Final  . RBC 08/17/2017 4.58  3.80 - 5.20 MIL/uL Final  . Hemoglobin 08/17/2017 13.9  12.0 - 16.0 g/dL Final  . HCT 08/17/2017 40.4  35.0 - 47.0 % Final  . MCV 08/17/2017 88.2  80.0 - 100.0 fL Final  . MCH 08/17/2017 30.3  26.0 - 34.0 pg Final  . MCHC 08/17/2017 34.4  32.0 - 36.0 g/dL  Final  . RDW 08/17/2017 12.9  11.5 - 14.5 % Final  . Platelets 08/17/2017 201  150 - 440 K/uL Final  . Neutrophils Relative % 08/17/2017 54  % Final  . Neutro Abs 08/17/2017 3.1  1.4 - 6.5 K/uL Final  . Lymphocytes Relative 08/17/2017 36  % Final  . Lymphs Abs 08/17/2017 2.0  1.0 - 3.6 K/uL Final  . Monocytes Relative 08/17/2017 7  % Final  . Monocytes Absolute 08/17/2017 0.4  0.2 - 0.9 K/uL Final  . Eosinophils Relative 08/17/2017 2  % Final  . Eosinophils Absolute 08/17/2017 0.1  0 - 0.7 K/uL Final  . Basophils Relative 08/17/2017 1  % Final  . Basophils Absolute 08/17/2017 0.1  0 - 0.1 K/uL Final   Performed at [facility], [address], [place], [place] [address]    Assessment:  Oasis Goehring is a 47 y.o. female with stage IA right breast invasive mammary carcinoma status post wide excision and axillary lymph node dissection on 06/21/2012.  Pathology revealed a 1.7 cm grade III ductal carcinoma.  Thirteen lymph nodes were negative.  Tumor was ER/PR positive and HER-2/neu negative. BRCA 1 and 2 testing was negative.  Oncotype DX was 29 (high) which translates into a distant recurrence of 19% at 10 yrs with tamoxifen alone.  The patient received 4 cycles of Adriamycin and Cytoxan (08/17/2012 - 10/19/2012) followed by 12 weeks of Taxol (11/19/2012 - 01/25/2013).  She completed local radiation on 04/20/2013.  She has been on tamoxifen since 04/21/2013.    CA27.29 has been followed: 17 on 01/12/2015, 18.6 on 07/27/2015, 14.9 on 01/28/2016, 13.9 on 08/21/2016, 15.1 on 02/03/2017, and 17.8 on 08/17/2017.  Bilateral mammogram on 08/11/2016 revealed no evidence of malignancy. Breast MRI on 04/09/2017 demonstrated no evidence of malignancy. Bilateral mammogram on 08/13/2017 revealing no mammographic evidence of malignancy in either breast. There were stable post lumpectomy changes noted in the RIGHT breast, including a far posterior seroma.   Symptomatically, she feels good.  She denies any  physical complaints.  Exam reveals significant post-operative and post radiation changes in the right breast and extensive fibrocystic changes in the left breast.  Labs are normal.   Plan: 1.  Labs today: CBC with diff, CMP, CA27.29, FSH, estradiol. 2.  Review breast MRI - no evidence of malignancy. 3.  Review mammogram - no evidence of malignancy. Far posterior seroma noted in RIGHT breast.  4.  Discuss BCI testing to assess the benefit of extended (5 versus 10 years) adjuvant hormonal therapy. Patient provided with written information on the testing today and encouraged to contact their insurance company to inquire about coverage and out of pocket costs.  5.  Discuss change in therapy to aromatase inhibitor. Discussed bone thinning as a side effect. Will require a bone density. In the interim, we will continue tamoxifen as previously prescribed.  6.  Discuss plan for yearly breast MRI secondary to significant fibrocystic changes and post treatment changes.  Exam difficult because of extensive changes. 7.  Schedule bilateral breast MRI with and without contrast for 04/09/2018.  8.  RTC in 6 months for MD assessment and labs (CBC with diff, CMP, CA27.29).   Honor Loh, NP  08/17/2017, 11:51 AM   I saw and evaluated the patient, participating in the key portions of the service and reviewing pertinent diagnostic studies and records.  I reviewed the nurse practitioner's note and agree with the findings and the plan.  The assessment and plan were discussed with the patient.  Additional diagnostic studies of a breast MRI are needed to adequately assess her breasts and would change the clinical management.  A few questions were asked by the patient and answered.   Nolon Stalls, MD 08/17/2017,11:51 AM

## 2017-08-17 ENCOUNTER — Inpatient Hospital Stay (HOSPITAL_BASED_OUTPATIENT_CLINIC_OR_DEPARTMENT_OTHER): Payer: 59 | Admitting: Hematology and Oncology

## 2017-08-17 ENCOUNTER — Inpatient Hospital Stay: Payer: 59 | Attending: Hematology and Oncology

## 2017-08-17 ENCOUNTER — Encounter: Payer: Self-pay | Admitting: Hematology and Oncology

## 2017-08-17 VITALS — BP 134/88 | HR 67 | Temp 97.2°F | Resp 18 | Wt 154.4 lb

## 2017-08-17 DIAGNOSIS — Z8249 Family history of ischemic heart disease and other diseases of the circulatory system: Secondary | ICD-10-CM

## 2017-08-17 DIAGNOSIS — Z801 Family history of malignant neoplasm of trachea, bronchus and lung: Secondary | ICD-10-CM

## 2017-08-17 DIAGNOSIS — Z5181 Encounter for therapeutic drug level monitoring: Secondary | ICD-10-CM

## 2017-08-17 DIAGNOSIS — C50911 Malignant neoplasm of unspecified site of right female breast: Secondary | ICD-10-CM | POA: Insufficient documentation

## 2017-08-17 DIAGNOSIS — Z79810 Long term (current) use of selective estrogen receptor modulators (SERMs): Secondary | ICD-10-CM

## 2017-08-17 DIAGNOSIS — Z803 Family history of malignant neoplasm of breast: Secondary | ICD-10-CM | POA: Diagnosis not present

## 2017-08-17 DIAGNOSIS — Z79899 Other long term (current) drug therapy: Secondary | ICD-10-CM

## 2017-08-17 DIAGNOSIS — Z170 Estrogen receptor positive status [ER+]: Secondary | ICD-10-CM | POA: Diagnosis not present

## 2017-08-17 LAB — COMPREHENSIVE METABOLIC PANEL
ALT: 22 U/L (ref 14–54)
AST: 27 U/L (ref 15–41)
Albumin: 3.7 g/dL (ref 3.5–5.0)
Alkaline Phosphatase: 52 U/L (ref 38–126)
Anion gap: 10 (ref 5–15)
BUN: 15 mg/dL (ref 6–20)
CO2: 24 mmol/L (ref 22–32)
Calcium: 9.1 mg/dL (ref 8.9–10.3)
Chloride: 104 mmol/L (ref 101–111)
Creatinine, Ser: 0.86 mg/dL (ref 0.44–1.00)
GFR calc Af Amer: >60 mL/min (ref >60)
GFR calc non Af Amer: >60 mL/min (ref >60)
Glucose, Bld: 103 mg/dL — ABNORMAL HIGH (ref 65–99)
Potassium: 3.9 mmol/L (ref 3.5–5.1)
Sodium: 138 mmol/L (ref 135–145)
Total Bilirubin: 0.3 mg/dL (ref 0.3–1.2)
Total Protein: 7.4 g/dL (ref 6.5–8.1)

## 2017-08-17 LAB — CBC WITH DIFFERENTIAL/PLATELET
Basophils Absolute: 0.1 10*3/uL (ref 0–0.1)
Basophils Relative: 1 %
Eosinophils Absolute: 0.1 10*3/uL (ref 0–0.7)
Eosinophils Relative: 2 %
HCT: 40.4 % (ref 35.0–47.0)
Hemoglobin: 13.9 g/dL (ref 12.0–16.0)
Lymphocytes Relative: 36 %
Lymphs Abs: 2 10*3/uL (ref 1.0–3.6)
MCH: 30.3 pg (ref 26.0–34.0)
MCHC: 34.4 g/dL (ref 32.0–36.0)
MCV: 88.2 fL (ref 80.0–100.0)
Monocytes Absolute: 0.4 10*3/uL (ref 0.2–0.9)
Monocytes Relative: 7 %
Neutro Abs: 3.1 10*3/uL (ref 1.4–6.5)
Neutrophils Relative %: 54 %
Platelets: 201 10*3/uL (ref 150–440)
RBC: 4.58 MIL/uL (ref 3.80–5.20)
RDW: 12.9 % (ref 11.5–14.5)
WBC: 5.6 10*3/uL (ref 3.6–11.0)

## 2017-08-17 NOTE — Progress Notes (Signed)
Patient offers no complaints today. 

## 2017-08-18 LAB — FOLLICLE STIMULATING HORMONE: FSH: 34.9 m[IU]/mL

## 2017-08-18 LAB — ESTRADIOL: Estradiol: 22.8 pg/mL

## 2017-08-18 LAB — CA 27.29 (SERIAL MONITOR): CA 27.29: 17.8 U/mL (ref 0.0–38.6)

## 2017-08-20 ENCOUNTER — Ambulatory Visit: Payer: Managed Care, Other (non HMO) | Admitting: General Surgery

## 2017-08-21 ENCOUNTER — Encounter: Payer: Self-pay | Admitting: Hematology and Oncology

## 2017-09-07 ENCOUNTER — Encounter: Payer: Self-pay | Admitting: Hematology and Oncology

## 2017-09-09 ENCOUNTER — Telehealth: Payer: Self-pay | Admitting: *Deleted

## 2017-09-09 NOTE — Telephone Encounter (Signed)
-----   Message from Lequita Asal, MD sent at 09/09/2017  8:22 AM EDT ----- Regarding: BCI testing back  We can have the patient come in for a brief review and decision about continuation of tamoxifen.  M  ----- Message ----- From: Darrin Nipper Sent: 09/08/2017   8:20 AM To: Lequita Asal, MD

## 2017-09-09 NOTE — Telephone Encounter (Signed)
Called patient to inform her that her BCI testing is back and MD would like to see her for a brief appointment to go over results and discuss Tamoxifen.  Patient is in agreement for one day next week.  Will send message to scheduling.

## 2017-09-13 NOTE — Progress Notes (Signed)
[place] [facility] day:  09/14/17  Chief Complaint: Natalie Gallagher is a 47 y.o. female with stage IA right breast cancer who is seen for 4 week assessment to discuss BCI results and direction of future endocrine therapy (tamoxifen).   HPI: The patient was last seen in the medical oncology clinic on 08/21/2017.  At that time, patient was doing well. She denies acute concerns. She denied breast concerns. Patient intentionally losing weight; weight down 9 pounds. Exam revealed significant post-operative and post radiation changes in the RIGHT breast and extensive fibrocystic changes in the LEFT breast.  Labs were unremarkable.   Hormone levels were drawn on 08/21/2017. Estradiol level was 22.8 pg/mL and FSH was 34.9 mIU/mL and consistent with a post-menopausal state.   Breast cancer index (BCI) testing sent on the patient's original tumor following her last visit. BCI testing is used to assess the benefit of extended (5 versus 10 years) of adjuvant hormonal therapy. Results received on 09/07/2017 demonstrated a high risk of recurrence at 8.3% (3.3% - 13.1%), but a low likelihood of benefit from extended endocrine therapy.  In the interim, patient is doing well today, and does not express any acute concerns. Patient denies B symptoms. She has not experienced any significant interval infections. Patient notes that she is eating well. Weight has decreased by 1 pound.   Patient denies pain in the clinic today.    Past Medical History:  Diagnosis Date  . Breast cancer ([place]) 2014   Chemo/radiaiton- Rt.  . Breast cancer of upper-outer quadrant of right female breast [facility]) March 2014   T1c,N0,M0; BRCA neg. ER: 60%; PR: 70%, her 2 neu not over expressed.   . Cancer [facility]) 2014   right breast.right wide excision with sentinal node biopsy on 06/21/12  . Lymphedema 2015   Right Arm.  . Personal history of chemotherapy   . Personal history of radiation therapy     Past  Surgical History:  Procedure Laterality Date  . BREAST BIOPSY Right 2014   Positive  . BREAST LUMPECTOMY Right 2014  . BREAST SURGERY Right 06/21/2012   right wide excision with sentinal node biopsy/axilary dissection.  . COLONOSCOPY WITH PROPOFOL N/A 10/01/2016   Procedure: COLONOSCOPY WITH PROPOFOL;  Surgeon: Robert Bellow, MD;  Location: [facility] ENDOSCOPY;  Service: Endoscopy;  Laterality: N/A;  . STRABISMUS SURGERY      Family History  Problem Relation Age of Onset  . Cancer Paternal Uncle        Lung  . Cancer Cousin        Breast   . Cancer Maternal Aunt        lung  . Diabetes Mother   . Hypertension Father   . Diabetes Father   . Breast cancer Neg Hx     Social History:  reports that she has never smoked. She has never used smokeless tobacco. She reports that she does not drink alcohol or use drugs.  She is going to the gym.  She lives in [place].  She is losing weight on Weight Watchers.  The patient is alone today.  Allergies:  Allergies  Allergen Reactions  . Tape     The patient showed a cutaneous reaction to Telfa applied beneath a Tegaderm dressing. Telfa appears to be the primary offensive material.    Current Medications: Current Outpatient Medications  Medication Sig Dispense Refill  . Multiple Vitamins-Calcium (VIACTIV MULTI-VITAMIN PO) Take 2 tablets by mouth daily.    Marland Kitchen  tamoxifen (NOLVADEX) 20 MG tablet TAKE 1 TABLET BY MOUTH AT NIGHT AT BEDTIME 90 tablet 3   No current facility-administered medications for this visit.     Review of Systems  Constitutional: Positive for weight loss (intentional; down 1 pound). Negative for diaphoresis, fever and malaise/fatigue.  HENT: Negative.  Negative for ear pain, hearing loss, nosebleeds, sinus pain and sore throat.   Eyes: Negative.  Negative for blurred vision, double vision, photophobia and pain.  Respiratory: Negative for cough, hemoptysis, sputum production and shortness of breath.   Cardiovascular:  Negative for chest pain, palpitations, orthopnea, leg swelling and PND.  Gastrointestinal: Negative for abdominal pain, blood in stool, constipation, diarrhea, melena, nausea and vomiting.  Genitourinary: Negative for dysuria, frequency, hematuria and urgency.  Musculoskeletal: Negative for back pain, falls, joint pain and myalgias.  Skin: Negative for itching and rash.  Neurological: Negative for dizziness, tremors, weakness and headaches.  Endo/Heme/Allergies: Does not bruise/bleed easily.       Vasomotor symptoms; stable. Hormone levels consistent with post-menopausal state.   Psychiatric/Behavioral: Negative for depression, memory loss and suicidal ideas. The patient is not nervous/anxious and does not have insomnia.   All other systems reviewed and are negative.  Performance status (ECOG): 0 - Asymptomatic  Physical Exam: Blood pressure 116/76, pulse 70, temperature 97.6 F (36.4 C), temperature source Tympanic, resp. rate 20, height '5\' 1"'$  (1.549 m), weight 153 lb (69.4 kg), last menstrual period 07/27/2012. GENERAL:  Well developed, well nourished, woman sitting comfortably in the exam room in no acute distress. MENTAL STATUS:  Alert and oriented to person, place and time. HEAD:  Curly black hair.  Normocephalic, atraumatic, face symmetric, no Cushingoid features. EYES:  Brown eyes.  No conjunctivitis or scleral icterus. NEUROLOGICAL: Unremarkable. PSYCH:  Appropriate.    No visits with results within 3 Day(s) from this visit.  Latest known visit with results is:  Appointment on 08/17/2017  Component Date Value Ref Range Status  . CA 27.29 08/17/2017 17.8  0.0 - 38.6 U/mL Final   Comment: (NOTE) Siemens Centaur Immunochemiluminometric Methodology [facility]) Values obtained with different assay methods or kits cannot be used interchangeably. Results cannot be interpreted as absolute evidence of the presence or absence of malignant disease. Performed At: [facility] [place], [place] [id number] Rush Farmer MD JK:[id number] Performed at [facility], [address] [place], [place] [address]   . Sodium 08/17/2017 138  135 - 145 mmol/L Final  . Potassium 08/17/2017 3.9  3.5 - 5.1 mmol/L Final  . Chloride 08/17/2017 104  101 - 111 mmol/L Final  . CO2 08/17/2017 24  22 - 32 mmol/L Final  . Glucose, Bld 08/17/2017 103* 65 - 99 mg/dL Final  . BUN 08/17/2017 15  6 - 20 mg/dL Final  . Creatinine, Ser 08/17/2017 0.86  0.44 - 1.00 mg/dL Final  . Calcium 08/17/2017 9.1  8.9 - 10.3 mg/dL Final  . Total Protein 08/17/2017 7.4  6.5 - 8.1 g/dL Final  . Albumin 08/17/2017 3.7  3.5 - 5.0 g/dL Final  . AST 08/17/2017 27  15 - 41 U/L Final  . ALT 08/17/2017 22  14 - 54 U/L Final  . Alkaline Phosphatase 08/17/2017 52  38 - 126 U/L Final  . Total Bilirubin 08/17/2017 0.3  0.3 - 1.2 mg/dL Final  . GFR calc non Af Amer 08/17/2017 >60  >60 mL/min Final  . GFR calc Af Amer 08/17/2017 >60  >60 mL/min Final   Comment: (NOTE) The eGFR  has been calculated using the CKD EPI equation. This calculation has not been validated in all clinical situations. eGFR's persistently <60 mL/min signify possible Chronic Kidney Disease.   Georgiann Hahn gap 08/17/2017 10  5 - 15 Final   Performed at [facility], [address] [place], [place] [address]  . WBC 08/17/2017 5.6  3.6 - 11.0 K/uL Final  . RBC 08/17/2017 4.58  3.80 - 5.20 MIL/uL Final  . Hemoglobin 08/17/2017 13.9  12.0 - 16.0 g/dL Final  . HCT 08/17/2017 40.4  35.0 - 47.0 % Final  . MCV 08/17/2017 88.2  80.0 - 100.0 fL Final  . MCH 08/17/2017 30.3  26.0 - 34.0 pg Final  . MCHC 08/17/2017 34.4  32.0 - 36.0 g/dL Final  . RDW 08/17/2017 12.9  11.5 - 14.5 % Final  . Platelets 08/17/2017 201  150 - 440 K/uL Final  . Neutrophils Relative % 08/17/2017 54  % Final  . Neutro Abs 08/17/2017 3.1  1.4 - 6.5 K/uL Final  . Lymphocytes Relative 08/17/2017 36  % Final  . Lymphs Abs 08/17/2017 2.0  1.0 - 3.6 K/uL  Final  . Monocytes Relative 08/17/2017 7  % Final  . Monocytes Absolute 08/17/2017 0.4  0.2 - 0.9 K/uL Final  . Eosinophils Relative 08/17/2017 2  % Final  . Eosinophils Absolute 08/17/2017 0.1  0 - 0.7 K/uL Final  . Basophils Relative 08/17/2017 1  % Final  . Basophils Absolute 08/17/2017 0.1  0 - 0.1 K/uL Final   Performed at [facility], [address] [place], [place] [address]  . Estradiol 08/17/2017 22.8  pg/mL Final   Comment: (NOTE)                    Adult Female:                      Follicular phase   38.1 -   166.0                      Ovulation phase    85.8 -   498.0                      Luteal phase       43.8 -   211.0                      Postmenopausal     <6.0 -    54.7                    Pregnancy                      1st trimester     215.0 - >4300.0                    Girls (1-10 years)    6.0 -    27.0 Roche ECLIA methodology Performed At: [facility] [place], [place] [id number] Rush Farmer MD NI:[id number] Performed at [facility], [address] [place], [place] [address]   . [facility] 08/17/2017 34.9  mIU/mL Final   Comment: (NOTE)                    Adult Female:                      Follicular  phase      3.5 -  12.5                      Ovulation phase       4.7 -  21.5                      Luteal phase          1.7 -   7.7                      Postmenopausal       25.8 - 134.8 Performed At: [facility] [place], [place] [id number] Rush Farmer MD WU:[id number] Performed at [facility], [address] [place],  [address]     Assessment:  Natalie Gallagher is a 47 y.o. female with stage IA right breast invasive mammary carcinoma status post wide excision and axillary lymph node dissection on 06/21/2012.  Pathology revealed a 1.7 cm grade III ductal carcinoma.  Thirteen lymph nodes were negative.  Tumor was ER/PR positive and HER-2/neu negative. BRCA 1 and 2 testing was  negative.  Oncotype DX was 29 (high) which translates into a distant recurrence of 19% at 10 yrs with tamoxifen alone.  The patient received 4 cycles of Adriamycin and Cytoxan (08/17/2012 - 10/19/2012) followed by 12 weeks of Taxol (11/19/2012 - 01/25/2013).  She completed local radiation on 04/20/2013.  She has been on tamoxifen since 04/21/2013.    CA27.29 has been followed: 17 on 01/12/2015, 18.6 on 07/27/2015, 14.9 on 01/28/2016, 13.9 on 08/21/2016, 15.1 on 02/03/2017, and 17.8 on 08/17/2017.  Bilateral mammogram on 08/11/2016 revealed no evidence of malignancy. Breast MRI on 04/09/2017 demonstrated no evidence of malignancy. Bilateral mammogram on 08/13/2017 revealing no mammographic evidence of malignancy in either breast. There were stable post lumpectomy changes noted in the RIGHT breast, including a far posterior seroma.   Hormone levels on 08/21/2017 revealed an estradiol level of 22.8 pg/mL and an [place] of34.9 mIU/mL, both of which are consistent with a post-menopausal state.   Breast cancer index (BCI) on 09/07/2017 demonstrated a a high risk of recurrence at 8.3%  (3.3% - 13.1%), but a low likelihood of benefit from extended endocrine therapy.  Symptomatically, she feels good.  Exam is stable.  Labs are normal.   Plan: 1. Review labs from 08/21/2017. Tumor marker stable. FSH and estradiol suggest post-menopausal state. 2. Review BCI testing. Results suggest a low likelihood that the patient would benefit from extended endocrine therapy. Patient has a 8.3% risk for late recurrence between years 5 through 55.  3. Discuss discontinuation of endocrine therapy vs. continued therapy (risk versus benefit reviewed). Patient is now post menopausal, which would make switching to aromatase inhibitor therapy appropriate. Side effects discussed. Discuss need for bone density testing should she wish to pursue extended endocrine therapy using an AI.   4. Review plan for yearly breast MRI secondary to  significant fibrocystic changes and post treatment changes.  Exam difficult because of extensive changes. Next bilateral breast MRI with and without contrast has been ordered for 04/09/2018.  5. RTC as already scheduled for MD assessment and labs (CBC with diff, CMP, CA27.29).   Honor Loh, NP  09/14/2017, 10:47 AM   I saw and evaluated the patient, participating in the key portions of the service and reviewing pertinent diagnostic studies and records.  I reviewed the nurse practitioner's note and agree with the findings and  the plan.  The assessment and plan were discussed with the patient. Several questions were asked by the patient and answered.   Nolon Stalls, MD 09/14/2017,10:47 AM

## 2017-09-14 ENCOUNTER — Inpatient Hospital Stay: Payer: 59 | Attending: Hematology and Oncology | Admitting: Hematology and Oncology

## 2017-09-14 ENCOUNTER — Encounter: Payer: Self-pay | Admitting: Hematology and Oncology

## 2017-09-14 ENCOUNTER — Other Ambulatory Visit: Payer: Self-pay

## 2017-09-14 VITALS — BP 116/76 | HR 70 | Temp 97.6°F | Resp 20 | Ht 61.0 in | Wt 153.0 lb

## 2017-09-14 DIAGNOSIS — Z9221 Personal history of antineoplastic chemotherapy: Secondary | ICD-10-CM | POA: Insufficient documentation

## 2017-09-14 DIAGNOSIS — Z923 Personal history of irradiation: Secondary | ICD-10-CM | POA: Insufficient documentation

## 2017-09-14 DIAGNOSIS — Z170 Estrogen receptor positive status [ER+]: Secondary | ICD-10-CM | POA: Diagnosis not present

## 2017-09-14 DIAGNOSIS — Z79810 Long term (current) use of selective estrogen receptor modulators (SERMs): Secondary | ICD-10-CM | POA: Insufficient documentation

## 2017-09-14 DIAGNOSIS — C50911 Malignant neoplasm of unspecified site of right female breast: Secondary | ICD-10-CM | POA: Diagnosis not present

## 2017-09-14 DIAGNOSIS — N6489 Other specified disorders of breast: Secondary | ICD-10-CM | POA: Insufficient documentation

## 2017-09-14 DIAGNOSIS — Z780 Asymptomatic menopausal state: Secondary | ICD-10-CM | POA: Diagnosis not present

## 2017-09-24 ENCOUNTER — Encounter: Payer: Self-pay | Admitting: General Surgery

## 2017-09-24 ENCOUNTER — Ambulatory Visit: Payer: 59 | Admitting: General Surgery

## 2017-09-24 VITALS — BP 132/84 | HR 66 | Resp 16 | Ht 61.0 in | Wt 155.0 lb

## 2017-09-24 DIAGNOSIS — C50411 Malignant neoplasm of upper-outer quadrant of right female breast: Secondary | ICD-10-CM

## 2017-09-24 DIAGNOSIS — Z170 Estrogen receptor positive status [ER+]: Secondary | ICD-10-CM | POA: Diagnosis not present

## 2017-09-24 NOTE — Patient Instructions (Addendum)
The patient is aware to call back for any questions or concerns. Continue with her gym/exercise routine. Patient will be asked to return to the office in one year with a bilateral screening mammogram.

## 2017-09-24 NOTE — Progress Notes (Signed)
Patient ID: Natalie Gallagher, female   DOB: Feb 27, 1971, 47 y.o.   MRN: [id number]  Chief Complaint  Patient presents with  . Follow-up    HPI Natalie Gallagher is a 47 y.o. female who presents for her follow up right breast cancer and a breast evaluation. The most recent mammogram was done on 08/13/17. Patient does perform regular self breast checks and gets regular mammograms done.  BCI results reviewed. No new breast issues.  HPI  Past Medical History:  Diagnosis Date  . Breast cancer ([facility]) 2014   Chemo/radiaiton- Rt.  . Breast cancer of upper-outer quadrant of right female breast [facility]) March 2014   T1c,N0,M0; BRCA neg. ER: 60%; PR: 70%, her 2 neu not over expressed.   . Cancer [facility]) 2014   right breast.right wide excision with sentinal node biopsy on 06/21/12  . Lymphedema 2015   Right Arm.  . Personal history of chemotherapy   . Personal history of radiation therapy     Past Surgical History:  Procedure Laterality Date  . BREAST BIOPSY Right 2014   Positive  . BREAST LUMPECTOMY Right 2014  . BREAST SURGERY Right 06/21/2012   right wide excision with sentinal node biopsy/axilary dissection.  . COLONOSCOPY WITH PROPOFOL N/A 10/01/2016   Procedure: COLONOSCOPY WITH PROPOFOL;  Surgeon: [name], MD;  Location: [facility] ENDOSCOPY;  Service: Endoscopy;  Laterality: N/A;  . STRABISMUS SURGERY      Family History  Problem Relation Age of Onset  . Cancer Paternal Uncle        Lung  . Cancer Cousin        Breast   . Cancer Maternal Aunt        lung  . Diabetes Mother   . Hypertension Father   . Diabetes Father   . Breast cancer Neg Hx     Social History Social History   Tobacco Use  . Smoking status: Never Smoker  . Smokeless tobacco: Never Used  Substance Use Topics  . Alcohol use: No  . Drug use: No    Allergies  Allergen Reactions  . Tape     The patient showed a cutaneous reaction to Telfa applied beneath a Tegaderm dressing. Telfa appears to be the primary  offensive material.    Current Outpatient Medications  Medication Sig Dispense Refill  . Multiple Vitamins-Calcium (VIACTIV MULTI-VITAMIN PO) Take 2 tablets by mouth daily.    . tamoxifen (NOLVADEX) 20 MG tablet TAKE 1 TABLET BY MOUTH AT NIGHT AT BEDTIME 90 tablet 3   No current facility-administered medications for this visit.     Review of Systems Review of Systems  Blood pressure 132/84, pulse 66, resp. rate 16, height 5\' 1"  (1.549 m), weight 155 lb (70.3 kg), last menstrual period 07/27/2012, SpO2 98 %.  Physical Exam Physical Exam  Constitutional: She is oriented to person, place, and time. She appears well-developed and well-nourished.  HENT:  Mouth/Throat: Oropharynx is clear and moist.  Eyes: Conjunctivae are normal. No scleral icterus.  Neck: Neck supple.  Cardiovascular: Normal rate, regular rhythm and normal heart sounds.  Pulmonary/Chest: Effort normal and breath sounds normal. Right breast exhibits no inverted nipple, no mass, no nipple discharge, no skin change and no tenderness. Left breast exhibits no inverted nipple, no mass, no nipple discharge, no skin change and no tenderness.  Right breast lumpectomy site well healed.    Lymphadenopathy:    She has no cervical adenopathy.    She has no axillary adenopathy.  Neurological: She  is alert and oriented to person, place, and time.  Skin: Skin is warm and dry.  Psychiatric: Her behavior is normal.    Data Reviewed Bilateral diagnostic mammograms dated Aug 13, 2017 were reviewed.  BI-RADS-2.  Postsurgical changes. The patient has fatty replaced breast.  Bilateral MRI of April 09, 2017 was negative.  BCI testing showed low benefit from extended antiestrogen therapy.  Assessment    Doing well now 5 years out from treatment of her T1c, N0 breast cancer.      Plan    BCI low risk. Continue with her gym/exercise routine. Patient will be asked to return to the office in one year with a bilateral screening  mammogram.  The patient has fatty replaced breasts, and her initial pathology showed invasive mammary carcinoma without lobular features.  I think the benefit from annual MRIs as being ordered by medical oncology is extremely small in this patient unless being done under a research protocol.      HPI, Physical Exam, Assessment and Plan have been scribed under the direction and in the presence of [name], MD. Natalie Daft, RN  I have completed the exam and reviewed the above documentation for accuracy and completeness.  I agree with the above.  Museum/gallery conservator has been used and any errors in dictation or transcription are unintentional.  Donnalee Curry, M.D., F.A.C.S.  Natalie Gallagher 09/25/2017, 10:13 AM

## 2017-09-28 ENCOUNTER — Encounter: Payer: Self-pay | Admitting: Hematology and Oncology

## 2017-09-29 ENCOUNTER — Encounter: Payer: Self-pay | Admitting: Urgent Care

## 2017-09-29 ENCOUNTER — Telehealth: Payer: Self-pay | Admitting: *Deleted

## 2017-09-29 DIAGNOSIS — I890 Lymphedema, not elsewhere classified: Secondary | ICD-10-CM | POA: Insufficient documentation

## 2017-09-29 NOTE — Telephone Encounter (Signed)
Natalie Gallagher  to Lequita Asal, MD        09/28/17 3:09 PM  Ok so I am in need of a new compression sleeve and I went to the place I had gotten my last one. They have gone out of business and I was told to go to a new location I will need a new prescription for one. Would It be possible for one to be written for me and I pick it up tomorrow?

## 2017-09-29 NOTE — Telephone Encounter (Signed)
I have responded to the patient's message online. I have the order completed. She can either come by and pick it up, or I can fax it. I am waiting to hear back which should would like me to do.

## 2017-09-29 NOTE — Telephone Encounter (Signed)
  OK for sleeve.  M

## 2017-09-30 ENCOUNTER — Other Ambulatory Visit: Payer: Self-pay | Admitting: Urgent Care

## 2017-09-30 DIAGNOSIS — R195 Other fecal abnormalities: Secondary | ICD-10-CM

## 2017-12-09 ENCOUNTER — Encounter: Payer: Self-pay | Admitting: Hematology and Oncology

## 2018-01-28 ENCOUNTER — Ambulatory Visit
Admission: RE | Admit: 2018-01-28 | Discharge: 2018-01-28 | Disposition: A | Discharge status: Home / Self Care | Payer: 59 | Source: Ambulatory Visit | Attending: Radiation Oncology | Admitting: Radiation Oncology

## 2018-01-28 ENCOUNTER — Other Ambulatory Visit: Payer: Self-pay

## 2018-01-28 ENCOUNTER — Encounter: Payer: Self-pay | Admitting: Radiation Oncology

## 2018-01-28 DIAGNOSIS — Z923 Personal history of irradiation: Secondary | ICD-10-CM | POA: Diagnosis not present

## 2018-01-28 DIAGNOSIS — Z170 Estrogen receptor positive status [ER+]: Secondary | ICD-10-CM | POA: Diagnosis not present

## 2018-01-28 DIAGNOSIS — C50911 Malignant neoplasm of unspecified site of right female breast: Secondary | ICD-10-CM | POA: Diagnosis present

## 2018-01-28 DIAGNOSIS — Z79810 Long term (current) use of selective estrogen receptor modulators (SERMs): Secondary | ICD-10-CM | POA: Diagnosis not present

## 2018-01-28 NOTE — Progress Notes (Signed)
Radiation Oncology Follow up Note  Name: Natalie Gallagher   Date:   01/28/2018 MRN:  [id number] DOB: 1970/07/02    This 47 y.o. female presents to the clinic today for 4-1/2 year follow-up status post whole breast radiation to right breast for stage I ER/PR positive invasive mammary carcinoma.  REFERRING PROVIDER: Glean Hess, MD  HPI: patient is a 47 year old female now 4-1/2 years out having completed whole breast radiation to her right breast for stage I ER/PR positive HER-2/neu negativeinvasive mammary carcinoma seen today in routine follow-up she is doing well. She specifically denies breast tenderness cough or bone pain..she's currently on tamoxifen tolerating that well without side effect.last mammogram was back in May I have reviewed was BI-RADS 2 benign.  COMPLICATIONS OF TREATMENT: none  FOLLOW UP COMPLIANCE: keeps appointments   PHYSICAL EXAM:  BP (!) (P) 149/76 (BP Location: Left Arm, Patient Position: Sitting)   Pulse (P) 85   Temp (!) (P) 97.1 F (36.2 C) (Tympanic)   Wt (P) 155 lb 13.8 oz (70.7 kg)   LMP 07/27/2012   BMI (P) 29.45 kg/m  Lungs are clear to A&P cardiac examination essentially unremarkable with regular rate and rhythm. No dominant mass or nodularity is noted in either breast in 2 positions examined. Incision is well-healed. No axillary or supraclavicular adenopathy is appreciated. Cosmetic result is excellent.Well-developed well-nourished patient in NAD. HEENT reveals PERLA, EOMI, discs not visualized.  Oral cavity is clear. No oral mucosal lesions are identified. Neck is clear without evidence of cervical or supraclavicular adenopathy. Lungs are clear to A&P. Cardiac examination is essentially unremarkable with regular rate and rhythm without murmur rub or thrill. Abdomen is benign with no organomegaly or masses noted. Motor sensory and DTR levels are equal and symmetric in the upper and lower extremities. Cranial nerves II through XII are grossly intact.  Proprioception is intact. No peripheral adenopathy or edema is identified. No motor or sensory levels are noted. Crude visual fields are within normal range.  RADIOLOGY RESULTS: mammograms reviewed and compatible with the above-stated findings  PLAN: present time patient continues to do well close to 5 years out with no evidence of disease. I'm going to discontinue follow-up care. I would be happy to reevaluate the patient any time should further consultation be indicated. Patient knows to call with any concerns at any time. She will be discontinuing tamoxifen at the five-year interval.  I would like to take this opportunity to thank you for allowing me to participate in the care of your patient.Noreene Filbert, MD

## 2018-02-04 ENCOUNTER — Ambulatory Visit (INDEPENDENT_AMBULATORY_CARE_PROVIDER_SITE_OTHER): Payer: 59 | Admitting: Internal Medicine

## 2018-02-04 ENCOUNTER — Encounter: Payer: Self-pay | Admitting: Internal Medicine

## 2018-02-04 VITALS — BP 118/78 | HR 62 | Ht 61.0 in | Wt 156.0 lb

## 2018-02-04 DIAGNOSIS — C50911 Malignant neoplasm of unspecified site of right female breast: Secondary | ICD-10-CM | POA: Diagnosis not present

## 2018-02-04 DIAGNOSIS — Z6829 Body mass index (BMI) 29.0-29.9, adult: Secondary | ICD-10-CM

## 2018-02-04 DIAGNOSIS — Z170 Estrogen receptor positive status [ER+]: Secondary | ICD-10-CM

## 2018-02-04 DIAGNOSIS — Z0000 Encounter for general adult medical examination without abnormal findings: Secondary | ICD-10-CM | POA: Diagnosis not present

## 2018-02-04 LAB — POCT URINALYSIS DIPSTICK
BILIRUBIN UA: NEGATIVE
Blood, UA: NEGATIVE
GLUCOSE UA: NEGATIVE
KETONES UA: NEGATIVE
Leukocytes, UA: NEGATIVE
NITRITE UA: NEGATIVE
PROTEIN UA: POSITIVE — AB
Urobilinogen, UA: 0.2 E.U./dL
pH, UA: 8 (ref 5.0–8.0)

## 2018-02-04 NOTE — Progress Notes (Signed)
Date:  02/04/2018   Name:  Edia Pursifull   DOB:  08-19-1970   MRN:  [id number]   Chief Complaint: Annual Exam (Breast Exam. No pap.) Natalee Tomkiewicz is a 47 y.o. female who presents today for her Complete Annual Exam. She feels well. She reports exercising regularly. She reports she is sleeping well. She has a breast MRI scheduled in January. She has been doing [employer] to lose weight and has a goal of 150 lbs.  They say she should be 114-132 which she feels is too low for her.  She is requesting a letter. She continues to see Oncology every 6 months - appt is next month.  She anticipates that she can stop Tamoxifen in January.   Review of Systems  Constitutional: Negative for chills, fatigue, fever and unexpected weight change.  HENT: Negative for congestion, hearing loss, tinnitus, trouble swallowing and voice change.   Eyes: Negative for visual disturbance.  Respiratory: Negative for cough, chest tightness, shortness of breath and wheezing.   Cardiovascular: Negative for chest pain, palpitations and leg swelling.  Gastrointestinal: Negative for abdominal pain, constipation, diarrhea and vomiting.  Endocrine: Negative for polydipsia and polyuria.  Genitourinary: Negative for dysuria, frequency, genital sores, vaginal bleeding and vaginal discharge.  Musculoskeletal: Negative for arthralgias, gait problem and joint swelling.  Skin: Negative for color change and rash.  Neurological: Negative for dizziness, tremors, light-headedness and headaches.  Hematological: Negative for adenopathy. Does not bruise/bleed easily.  Psychiatric/Behavioral: Negative for dysphoric mood and sleep disturbance. The patient is not nervous/anxious.     Patient Active Problem List   Diagnosis Date Noted  . Lymphedema of right upper extremity 09/29/2017  . Malignant neoplasm of right breast in female, estrogen receptor positive ([place]) 07/20/2013  . BMI 29.0-29.9,adult 11/28/2011    Allergies  Allergen Reactions    . Tape     The patient showed a cutaneous reaction to Telfa applied beneath a Tegaderm dressing. Telfa appears to be the primary offensive material.    Past Surgical History:  Procedure Laterality Date  . BREAST BIOPSY Right 2014   Positive  . BREAST LUMPECTOMY Right 2014  . BREAST SURGERY Right 06/21/2012   right wide excision with sentinal node biopsy/axilary dissection.  . COLONOSCOPY WITH PROPOFOL N/A 10/01/2016   Procedure: COLONOSCOPY WITH PROPOFOL;  Surgeon: Robert Bellow, MD;  Location: [facility] ENDOSCOPY;  Service: Endoscopy;  Laterality: N/A;  . STRABISMUS SURGERY      Social History   Tobacco Use  . Smoking status: Never Smoker  . Smokeless tobacco: Never Used  Substance Use Topics  . Alcohol use: No  . Drug use: No     Medication list has been reviewed and updated.  Current Meds  Medication Sig  . Multiple Vitamins-Calcium (VIACTIV MULTI-VITAMIN PO) Take 2 tablets by mouth daily.  . tamoxifen (NOLVADEX) 20 MG tablet TAKE 1 TABLET BY MOUTH AT NIGHT AT BEDTIME    PHQ 2/9 Scores 02/04/2018 01/08/2017 12/21/2014  PHQ - 2 Score 0 0 0    Physical Exam  Constitutional: She is oriented to person, place, and time. She appears well-developed and well-nourished. No distress.  HENT:  Head: Normocephalic and atraumatic.  Right Ear: Tympanic membrane and ear canal normal.  Left Ear: Tympanic membrane and ear canal normal.  Nose: Right sinus exhibits no maxillary sinus tenderness. Left sinus exhibits no maxillary sinus tenderness.  Mouth/Throat: Uvula is midline and oropharynx is clear and moist.  Eyes: Conjunctivae and EOM are normal. Right eye exhibits  no discharge. Left eye exhibits no discharge. No scleral icterus.  Neck: Normal range of motion. Carotid bruit is not present. No erythema present. No thyromegaly present.  Cardiovascular: Normal rate, regular rhythm, normal heart sounds and normal pulses.  Pulmonary/Chest: Effort normal. No respiratory distress. She has  no wheezes. Right breast exhibits skin change. Right breast exhibits no mass, no nipple discharge and no tenderness. Left breast exhibits no mass, no nipple discharge, no skin change and no tenderness.    Abdominal: Soft. Bowel sounds are normal. There is no hepatosplenomegaly. There is no tenderness. There is no CVA tenderness.  Musculoskeletal: Normal range of motion.  Lymphadenopathy:    She has no cervical adenopathy.    She has no axillary adenopathy.  Neurological: She is alert and oriented to person, place, and time. She has normal reflexes. No cranial nerve deficit or sensory deficit.  Skin: Skin is warm, dry and intact. No rash noted.  Psychiatric: She has a normal mood and affect. Her speech is normal and behavior is normal. Thought content normal.  Nursing note and vitals reviewed.   BP 118/78 (BP Location: Right Arm, Patient Position: Sitting, Cuff Size: Normal)   Pulse 62   Ht 5\' 1"  (1.549 m)   Wt 156 lb (70.8 kg)   LMP 07/27/2012   SpO2 98%   BMI 29.48 kg/m   Assessment and Plan: 1. Annual physical exam Continue regular exercise  Colonoscopy next year - Lipid panel - TSH - POCT urinalysis dipstick  2. BMI 29.0-29.9,adult [employer] letter sent  3. Malignant neoplasm of right breast in female, estrogen receptor positive, unspecified site of breast ([place]) - CBC with Differential/Platelet - Comprehensive metabolic panel - Cancer Antigen 27.29   Partially dictated using Dragon software. Any errors are unintentional.  Halina Maidens, MD [facility]  02/04/2018

## 2018-02-05 LAB — LIPID PANEL
CHOL/HDL RATIO: 2.7 ratio (ref 0.0–4.4)
Cholesterol, Total: 146 mg/dL (ref 100–199)
HDL: 54 mg/dL (ref >39)
LDL Calculated: 67 mg/dL (ref 0–99)
TRIGLYCERIDES: 123 mg/dL (ref 0–149)
VLDL Cholesterol Cal: 25 mg/dL (ref 5–40)

## 2018-02-05 LAB — CBC WITH DIFFERENTIAL/PLATELET

## 2018-02-05 LAB — COMPREHENSIVE METABOLIC PANEL
A/G RATIO: 1.4 (ref 1.2–2.2)
ALT: 20 IU/L (ref 0–32)
AST: 16 IU/L (ref 0–40)
Albumin: 3.9 g/dL (ref 3.5–5.5)
Alkaline Phosphatase: 53 IU/L (ref 39–117)
BUN/Creatinine Ratio: 13 (ref 9–23)
BUN: 12 mg/dL (ref 6–24)
Bilirubin Total: <0.2 mg/dL (ref 0.0–1.2)
CHLORIDE: 104 mmol/L (ref 96–106)
CO2: 24 mmol/L (ref 20–29)
Calcium: 9.1 mg/dL (ref 8.7–10.2)
Creatinine, Ser: 0.96 mg/dL (ref 0.57–1.00)
GFR calc Af Amer: 81 mL/min/{1.73_m2} (ref >59)
GFR, EST NON AFRICAN AMERICAN: 70 mL/min/{1.73_m2} (ref >59)
GLUCOSE: 76 mg/dL (ref 65–99)
Globulin, Total: 2.8 g/dL (ref 1.5–4.5)
POTASSIUM: 4.2 mmol/L (ref 3.5–5.2)
SODIUM: 142 mmol/L (ref 134–144)
Total Protein: 6.7 g/dL (ref 6.0–8.5)

## 2018-02-05 LAB — TSH: TSH: 2.48 u[IU]/mL (ref 0.450–4.500)

## 2018-02-05 LAB — CANCER ANTIGEN 27.29: CA 27.29: 13.3 U/mL (ref 0.0–38.6)

## 2018-02-12 ENCOUNTER — Other Ambulatory Visit: Payer: Self-pay | Admitting: *Deleted

## 2018-02-12 DIAGNOSIS — Z170 Estrogen receptor positive status [ER+]: Principal | ICD-10-CM

## 2018-02-12 DIAGNOSIS — C50911 Malignant neoplasm of unspecified site of right female breast: Secondary | ICD-10-CM

## 2018-02-15 ENCOUNTER — Inpatient Hospital Stay (HOSPITAL_BASED_OUTPATIENT_CLINIC_OR_DEPARTMENT_OTHER): Payer: 59 | Admitting: Hematology and Oncology

## 2018-02-15 ENCOUNTER — Inpatient Hospital Stay: Payer: 59 | Attending: Hematology and Oncology

## 2018-02-15 ENCOUNTER — Other Ambulatory Visit: Payer: Self-pay | Admitting: Hematology and Oncology

## 2018-02-15 ENCOUNTER — Encounter: Payer: Self-pay | Admitting: Hematology and Oncology

## 2018-02-15 VITALS — BP 142/86 | HR 62 | Temp 97.7°F | Resp 18 | Ht 61.0 in | Wt 156.0 lb

## 2018-02-15 DIAGNOSIS — Z9221 Personal history of antineoplastic chemotherapy: Secondary | ICD-10-CM | POA: Diagnosis not present

## 2018-02-15 DIAGNOSIS — Z170 Estrogen receptor positive status [ER+]: Secondary | ICD-10-CM

## 2018-02-15 DIAGNOSIS — Z923 Personal history of irradiation: Secondary | ICD-10-CM

## 2018-02-15 DIAGNOSIS — C50911 Malignant neoplasm of unspecified site of right female breast: Secondary | ICD-10-CM | POA: Insufficient documentation

## 2018-02-15 DIAGNOSIS — Z79810 Long term (current) use of selective estrogen receptor modulators (SERMs): Secondary | ICD-10-CM | POA: Insufficient documentation

## 2018-02-15 DIAGNOSIS — Z780 Asymptomatic menopausal state: Secondary | ICD-10-CM | POA: Insufficient documentation

## 2018-02-15 DIAGNOSIS — I890 Lymphedema, not elsewhere classified: Secondary | ICD-10-CM

## 2018-02-15 LAB — CBC WITH DIFFERENTIAL/PLATELET
Abs Immature Granulocytes: 0.01 10*3/uL (ref 0.00–0.07)
Basophils Absolute: 0 10*3/uL (ref 0.0–0.1)
Basophils Relative: 0 %
Eosinophils Absolute: 0.1 10*3/uL (ref 0.0–0.5)
Eosinophils Relative: 2 %
HCT: 38 % (ref 36.0–46.0)
Hemoglobin: 12.6 g/dL (ref 12.0–15.0)
Immature Granulocytes: 0 %
Lymphocytes Relative: 35 %
Lymphs Abs: 1.8 10*3/uL (ref 0.7–4.0)
MCH: 29.4 pg (ref 26.0–34.0)
MCHC: 33.2 g/dL (ref 30.0–36.0)
MCV: 88.8 fL (ref 80.0–100.0)
Monocytes Absolute: 0.4 10*3/uL (ref 0.1–1.0)
Monocytes Relative: 8 %
Neutro Abs: 2.9 10*3/uL (ref 1.7–7.7)
Neutrophils Relative %: 55 %
Platelets: 178 10*3/uL (ref 150–400)
RBC: 4.28 MIL/uL (ref 3.87–5.11)
RDW: 12 % (ref 11.5–15.5)
WBC: 5.3 10*3/uL (ref 4.0–10.5)
nRBC: 0 % (ref 0.0–0.2)

## 2018-02-15 NOTE — Progress Notes (Signed)
No new changes noted today 

## 2018-02-15 NOTE — Progress Notes (Signed)
[facility] day:  02/15/2018  Chief Complaint: Natalie Gallagher is a 47 y.o. female with stage IA right breast cancer who is seen for 6 month assessment.   HPI: The patient was last seen in the medical oncology clinic on 09/14/2017.  At that time, she felt good.  Exam was stable.  Labs were normal.  FSH and estradiol suggested a post-menopausal state.  At last visit, we discussed results from her BCI testing- a high risk of recurrence at 8.3% (3.3% - 13.1%), but a low likelihood of benefit from extended endocrine therapy.  During the interim, she has done well.  She is taking tamoxifen.     Past Medical History:  Diagnosis Date  . Breast cancer ([place]) 2014   Chemo/radiaiton- Rt.  . Breast cancer of upper-outer quadrant of right female breast [facility]) March 2014   T1c,N0,M0; BRCA neg. ER: 60%; PR: 70%, her 2 neu not over expressed.   . Cancer [facility]) 2014   right breast.right wide excision with sentinal node biopsy on 06/21/12  . Lymphedema 2015   Right Arm.  . Personal history of chemotherapy   . Personal history of radiation therapy     Past Surgical History:  Procedure Laterality Date  . BREAST BIOPSY Right 2014   Positive  . BREAST LUMPECTOMY Right 2014  . BREAST SURGERY Right 06/21/2012   right wide excision with sentinal node biopsy/axilary dissection.  . COLONOSCOPY WITH PROPOFOL N/A 10/01/2016   Procedure: COLONOSCOPY WITH PROPOFOL;  Surgeon: Robert Bellow, MD;  Location: [facility] ENDOSCOPY;  Service: Endoscopy;  Laterality: N/A;  . STRABISMUS SURGERY      Family History  Problem Relation Age of Onset  . Cancer Paternal Uncle        Lung  . Cancer Cousin        Breast   . Cancer Maternal Aunt        lung  . Diabetes Mother   . Hypertension Father   . Diabetes Father   . Breast cancer Neg Hx     Social History:  reports that she has never smoked. She has never used smokeless tobacco. She reports that she does not drink alcohol  or use drugs.  She is going to the gym.  She lives in [place].  She is losing weight on [employer].  The patient is alone today.  Allergies:  Allergies  Allergen Reactions  . Tape     The patient showed a cutaneous reaction to Telfa applied beneath a Tegaderm dressing. Telfa appears to be the primary offensive material.    Current Medications: Current Outpatient Medications  Medication Sig Dispense Refill  . Multiple Vitamins-Calcium (VIACTIV MULTI-VITAMIN PO) Take 2 tablets by mouth daily.    . tamoxifen (NOLVADEX) 20 MG tablet TAKE 1 TABLET BY MOUTH AT NIGHT AT BEDTIME 90 tablet 3   No current facility-administered medications for this visit.     Review of Systems  Constitutional: Positive for weight loss (up 3 pounds). Negative for chills, diaphoresis, fever and malaise/fatigue.       Doing well.  HENT: Negative.  Negative for congestion, ear pain, hearing loss, nosebleeds and sinus pain.   Eyes: Negative.  Negative for blurred vision, double vision, photophobia and pain.  Respiratory: Negative.  Negative for cough, hemoptysis, sputum production and shortness of breath.   Cardiovascular: Negative.  Negative for palpitations, orthopnea, leg swelling and PND.  Gastrointestinal: Negative.  Negative for abdominal pain, blood in stool,  constipation, diarrhea, melena, nausea and vomiting.  Genitourinary: Negative.  Negative for dysuria, frequency and hematuria.  Musculoskeletal: Negative.  Negative for back pain, falls, joint pain and neck pain.  Skin: Negative.  Negative for itching and rash.  Neurological: Negative.  Negative for dizziness, tremors, speech change, focal weakness, weakness and headaches.  Endo/Heme/Allergies: Does not bruise/bleed easily.       Vasomotor symptoms-stable. Post-menopausal.   Psychiatric/Behavioral: Negative for depression, memory loss and suicidal ideas. The patient does not have insomnia.   All other systems reviewed and are  negative.  Performance status (ECOG): 0  Physical Exam: Blood pressure (!) 142/86, pulse 62, temperature 97.7 F (36.5 C), temperature source Tympanic, resp. rate 18, height '5\' 1"'$  (1.549 m), weight 156 lb (70.8 kg), last menstrual period 07/27/2012, SpO2 100 %. GENERAL:  Well developed, well nourished, woman sitting comfortably in the exam room in no acute distress. MENTAL STATUS:  Alert and oriented to person, place and time. HEAD:  Curly black hair.  Normocephalic, atraumatic, face symmetric, no Cushingoid features. EYES:  Brown eyes.  Pupils equal round and reactive to light and accomodation.  No conjunctivitis or scleral icterus. ENT:  Oropharynx clear without lesion.  Tongue normal. Mucous membranes moist.  RESPIRATORY:  Clear to auscultation without rales, wheezes or rhonchi. CARDIOVASCULAR:  Regular rate and rhythm without murmur, rub or gallop. BREAST:  Right breast post-operative changes in the lower inner quadrant.  No masses, skin changes or nipple discharge.  Left breast with upper quadrant fibrocystic changes. No masses, skin changes or nipple discharge. ABDOMEN:  Soft, non-tender, with active bowel sounds, and no hepatosplenomegaly.  No masses. SKIN:  No rashes, ulcers or lesions. EXTREMITIES: No edema, no skin discoloration or tenderness.  No palpable cords. LYMPH NODES: No palpable cervical, supraclavicular, axillary or inguinal adenopathy  NEUROLOGICAL: Unremarkable. PSYCH:  Appropriate.    Appointment on 02/15/2018  Component Date Value Ref Range Status  . Estradiol 02/15/2018 14.3  pg/mL Final   Comment: (NOTE)                    Adult Female:                      Follicular phase   47.0 -   166.0                      Ovulation phase    85.8 -   498.0                      Luteal phase       43.8 -   211.0                      Postmenopausal     <6.0 -    54.7                    Pregnancy                      1st trimester     215.0 - >4300.0                     Girls (1-10 years)    6.0 -    27.0 Roche ECLIA methodology Performed At: [facility] [place], [place] [id number] Rush Farmer MD [id number]   . [facility] 02/15/2018 31.5  mIU/mL Final  Comment: (NOTE)                    Adult Female:                      Follicular phase      3.5 -  12.5                      Ovulation phase       4.7 -  21.5                      Luteal phase          1.7 -   7.7                      Postmenopausal       25.8 - 134.8 Performed At: [facility] [place], [place] [id number] Rush Farmer MD [id number]   . WBC 02/15/2018 5.3  4.0 - 10.5 K/uL Final  . RBC 02/15/2018 4.28  3.87 - 5.11 MIL/uL Final  . Hemoglobin 02/15/2018 12.6  12.0 - 15.0 g/dL Final  . HCT 02/15/2018 38.0  36.0 - 46.0 % Final  . MCV 02/15/2018 88.8  80.0 - 100.0 fL Final  . MCH 02/15/2018 29.4  26.0 - 34.0 pg Final  . MCHC 02/15/2018 33.2  30.0 - 36.0 g/dL Final  . RDW 02/15/2018 12.0  11.5 - 15.5 % Final  . Platelets 02/15/2018 178  150 - 400 K/uL Final  . nRBC 02/15/2018 0.0  0.0 - 0.2 % Final  . Neutrophils Relative % 02/15/2018 55  % Final  . Neutro Abs 02/15/2018 2.9  1.7 - 7.7 K/uL Final  . Lymphocytes Relative 02/15/2018 35  % Final  . Lymphs Abs 02/15/2018 1.8  0.7 - 4.0 K/uL Final  . Monocytes Relative 02/15/2018 8  % Final  . Monocytes Absolute 02/15/2018 0.4  0.1 - 1.0 K/uL Final  . Eosinophils Relative 02/15/2018 2  % Final  . Eosinophils Absolute 02/15/2018 0.1  0.0 - 0.5 K/uL Final  . Basophils Relative 02/15/2018 0  % Final  . Basophils Absolute 02/15/2018 0.0  0.0 - 0.1 K/uL Final  . Immature Granulocytes 02/15/2018 0  % Final  . Abs Immature Granulocytes 02/15/2018 0.01  0.00 - 0.07 K/uL Final   Performed at [facility], [address] [place], [place] [address]    Assessment:  Natalie Gallagher is a 47 y.o. female with stage IA right breast invasive mammary carcinoma status post wide excision and axillary  lymph node dissection on 06/21/2012.  Pathology revealed a 1.7 cm grade III ductal carcinoma.  Thirteen lymph nodes were negative.  Tumor was ER/PR positive and HER-2/neu negative. BRCA 1 and 2 testing was negative.  Oncotype DX was 29 (high) which translates into a distant recurrence of 19% at 10 yrs with tamoxifen alone.  The patient received 4 cycles of Adriamycin and Cytoxan (08/17/2012 - 10/19/2012) followed by 12 weeks of Taxol (11/19/2012 - 01/25/2013).  She completed local radiation on 04/20/2013.  She has been on tamoxifen since 04/21/2013.    CA27.29 has been followed: 17 on 01/12/2015, 18.6 on 07/27/2015, 14.9 on 01/28/2016, 13.9 on 08/21/2016, 15.1 on 02/03/2017, 17.8 on 08/17/2017, and 13.3 on 02/04/2018.  Bilateral mammogram on 08/11/2016 revealed no evidence of malignancy. Breast MRI on 04/09/2017 demonstrated no evidence of malignancy. Bilateral mammogram on 08/13/2017 revealing no mammographic evidence  of malignancy in either breast. There were stable post lumpectomy changes noted in the RIGHT breast, including a far posterior seroma.   Hormone levels on 08/21/2017 revealed an estradiol level of 22.8 pg/mL and an FSH of 34.9 mIU/mL, both of which are consistent with a post-menopausal state.   Breast cancer index (BCI) on 09/07/2017 demonstrated a a high risk of recurrence at 8.3%  (3.3% - 13.1%), but a low likelihood of benefit from extended endocrine therapy.  Symptomatically, she is doing well.  Exam is stable.  CA27.29 is normal.  Plan: 1. Labs today:  CBC with diff, CMP, CA27.29. 2. Stage IA right breast cancer:  Clinically doing well.  Review BCI testing- high risk of recurrence, but low likelihood of benefit of extended adjuvant therapy.  Patient would like to continue tamoxifen until 04/21/2018.  Breast MRI on 04/09/2018.  3. RTC in 6 months for MD assessment, labs (CBC with diff, CMP, CA27.29), and review of breast MRI.   Lequita Asal, MD  02/15/2018, 4:35 PM

## 2018-02-16 LAB — FOLLICLE STIMULATING HORMONE: FSH: 31.5 m[IU]/mL

## 2018-02-16 LAB — ESTRADIOL: Estradiol: 14.3 pg/mL

## 2018-03-08 ENCOUNTER — Other Ambulatory Visit: Payer: Self-pay | Admitting: Hematology and Oncology

## 2018-03-08 DIAGNOSIS — Z1231 Encounter for screening mammogram for malignant neoplasm of breast: Secondary | ICD-10-CM

## 2018-04-09 ENCOUNTER — Ambulatory Visit (HOSPITAL_COMMUNITY): Payer: 59

## 2018-05-13 ENCOUNTER — Other Ambulatory Visit: Payer: Self-pay | Admitting: Hematology and Oncology

## 2018-07-21 ENCOUNTER — Ambulatory Visit (HOSPITAL_COMMUNITY): Payer: 59

## 2018-07-22 DIAGNOSIS — L232 Allergic contact dermatitis due to cosmetics: Secondary | ICD-10-CM | POA: Diagnosis not present

## 2018-08-12 ENCOUNTER — Inpatient Hospital Stay: Payer: 59 | Attending: Hematology and Oncology

## 2018-08-12 ENCOUNTER — Other Ambulatory Visit: Payer: Self-pay

## 2018-08-12 DIAGNOSIS — Z170 Estrogen receptor positive status [ER+]: Secondary | ICD-10-CM | POA: Diagnosis not present

## 2018-08-12 DIAGNOSIS — C50911 Malignant neoplasm of unspecified site of right female breast: Secondary | ICD-10-CM | POA: Diagnosis not present

## 2018-08-12 LAB — COMPREHENSIVE METABOLIC PANEL
ALT: 26 U/L (ref 0–44)
AST: 23 U/L (ref 15–41)
Albumin: 3.8 g/dL (ref 3.5–5.0)
Alkaline Phosphatase: 59 U/L (ref 38–126)
Anion gap: 8 (ref 5–15)
BUN: 17 mg/dL (ref 6–20)
CO2: 27 mmol/L (ref 22–32)
Calcium: 9.1 mg/dL (ref 8.9–10.3)
Chloride: 105 mmol/L (ref 98–111)
Creatinine, Ser: 0.77 mg/dL (ref 0.44–1.00)
GFR calc Af Amer: >60 mL/min (ref >60)
GFR calc non Af Amer: >60 mL/min (ref >60)
Glucose, Bld: 75 mg/dL (ref 70–99)
Potassium: 3.5 mmol/L (ref 3.5–5.1)
Sodium: 140 mmol/L (ref 135–145)
Total Bilirubin: 0.5 mg/dL (ref 0.3–1.2)
Total Protein: 7.3 g/dL (ref 6.5–8.1)

## 2018-08-12 LAB — CBC WITH DIFFERENTIAL/PLATELET
Abs Immature Granulocytes: 0.01 10*3/uL (ref 0.00–0.07)
Basophils Absolute: 0 10*3/uL (ref 0.0–0.1)
Basophils Relative: 0 %
Eosinophils Absolute: 0.1 10*3/uL (ref 0.0–0.5)
Eosinophils Relative: 2 %
HCT: 41.2 % (ref 36.0–46.0)
Hemoglobin: 14 g/dL (ref 12.0–15.0)
Immature Granulocytes: 0 %
Lymphocytes Relative: 28 %
Lymphs Abs: 1.6 10*3/uL (ref 0.7–4.0)
MCH: 30.4 pg (ref 26.0–34.0)
MCHC: 34 g/dL (ref 30.0–36.0)
MCV: 89.4 fL (ref 80.0–100.0)
Monocytes Absolute: 0.4 10*3/uL (ref 0.1–1.0)
Monocytes Relative: 7 %
Neutro Abs: 3.5 10*3/uL (ref 1.7–7.7)
Neutrophils Relative %: 63 %
Platelets: 202 10*3/uL (ref 150–400)
RBC: 4.61 MIL/uL (ref 3.87–5.11)
RDW: 12.9 % (ref 11.5–15.5)
WBC: 5.6 10*3/uL (ref 4.0–10.5)
nRBC: 0 % (ref 0.0–0.2)

## 2018-08-12 NOTE — Progress Notes (Signed)
[facility]  [address] [place], [place] [address] Phone: [phone]  Fax: [phone]   Telemedicine Office Visit:  08/16/2018  Referring physician: Glean Hess, MD  I connected with [name] on 08/16/2018 at 9:09 AM EDT by videoconferencing and verified that I was speaking with the correct person using 2 identifiers.  The patient was at work.  I discussed the limitations, risk, security and privacy concerns of performing an evaluation and management service by videoconferencing and the availability of in person appointments.  I also discussed with the patient that there may be a patient responsible charge related to this service.  The patient expressed understanding and agreed to proceed.   Chief Complaint: Natalie Gallagher is a 48 y.o. female with stage IA right breast cancer who is seen for 6 month assessment.   HPI: The patient was last seen in the medical oncology clinic on 02/15/2018.  At that time, she was doing well.  Exam was stable.  CA27.29 was normal.  FSH was 31.5 and estradiol was 14.3 c/w post-menopausal state.  She continued on tamoxifen.   Her next mammogram is scheduled for 08/19/2018 and her next breast MRI is scheduled for 10/11/2018.   CBC and CMP on 08/12/2018 all WNL. CA 27.29 was 11.2.  During the interim, she has done well.  She notes that her hot flashes have diminished since her last appointment.  She is preforming breast self exams periodically.  She stopped tamoxifen around 04/21/2018.  She denies any concerns.   Past Medical History:  Diagnosis Date  . Breast cancer ([place]) 2014   Chemo/radiaiton- Rt.  . Breast cancer of upper-outer quadrant of right female breast [facility]) March 2014   T1c,N0,M0; BRCA neg. ER: 60%; PR: 70%, her 2 neu not over expressed.   . Cancer [facility]) 2014   right breast.right wide excision with sentinal node biopsy on 06/21/12  . Lymphedema 2015   Right Arm.  . Personal history of chemotherapy    . Personal history of radiation therapy     Past Surgical History:  Procedure Laterality Date  . BREAST BIOPSY Right 2014   Positive  . BREAST LUMPECTOMY Right 2014  . BREAST SURGERY Right 06/21/2012   right wide excision with sentinal node biopsy/axilary dissection.  . COLONOSCOPY WITH PROPOFOL N/A 10/01/2016   Procedure: COLONOSCOPY WITH PROPOFOL;  Surgeon: Robert Bellow, MD;  Location: [facility] ENDOSCOPY;  Service: Endoscopy;  Laterality: N/A;  . STRABISMUS SURGERY      Family History  Problem Relation Age of Onset  . Cancer Paternal Uncle        Lung  . Cancer Cousin        Breast   . Cancer Maternal Aunt        lung  . Diabetes Mother   . Hypertension Father   . Diabetes Father   . Breast cancer Neg Hx     Social History:  reports that she has never smoked. She has never used smokeless tobacco. She reports that she does not drink alcohol or use drugs. She is going to the gym.  She lives in [place].  She is losing weight on Weight Watchers.  She works at the [employer].  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  The patient is at work today.  Participants in the patient's visit and their role in the encounter included the patient and Vito Berger, CMA, today.  The intake visit was provided by  Vito Berger, CMA.   Allergies:  Allergies  Allergen Reactions  . Tape     The patient showed a cutaneous reaction to Telfa applied beneath a Tegaderm dressing. Telfa appears to be the primary offensive material.    Current Medications: Current Outpatient Medications  Medication Sig Dispense Refill  . Multiple Vitamins-Calcium (VIACTIV MULTI-VITAMIN PO) Take 2 tablets by mouth daily.    . tamoxifen (NOLVADEX) 20 MG tablet TAKE 1 TABLET BY MOUTH AT NIGHT AT BEDTIME (Patient not taking: Reported on 08/16/2018) 90 tablet 3   No current facility-administered medications for this visit.     Review of Systems   Constitutional: Negative.  Negative for chills, diaphoresis, fever, malaise/fatigue and weight loss.       Feels good.  HENT: Negative.  Negative for congestion, ear pain, hearing loss, nosebleeds, sinus pain and sore throat.   Eyes: Negative.  Negative for blurred vision, double vision, photophobia and pain.  Respiratory: Negative.  Negative for cough, hemoptysis, sputum production and shortness of breath.   Cardiovascular: Negative.  Negative for chest pain, palpitations, orthopnea, leg swelling and PND.  Gastrointestinal: Negative.  Negative for abdominal pain, blood in stool, constipation, diarrhea, heartburn, melena, nausea (with gadolinium for MRI) and vomiting.  Genitourinary: Negative.  Negative for dysuria, frequency, hematuria and urgency.  Musculoskeletal: Negative.  Negative for back pain, joint pain, myalgias and neck pain.  Skin: Negative.  Negative for itching and rash.  Neurological: Negative.  Negative for dizziness, tremors, sensory change, speech change, focal weakness, weakness and headaches.  Endo/Heme/Allergies: Does not bruise/bleed easily.       Vasomotor symptoms- improved. Post-menopausal.   Psychiatric/Behavioral: Negative.  Negative for depression and memory loss. The patient is not nervous/anxious and does not have insomnia.   All other systems reviewed and are negative.  Performance status (ECOG): 0  Physical Exam  Constitutional: She is oriented to person, place, and time. She appears well-developed and well-nourished. No distress.  HENT:  Head: Atraumatic.  Curly black hair.  Eyes: Conjunctivae and EOM are normal. No scleral icterus.  Glasses.  Brown eyes.  Neurological: She is alert and oriented to person, place, and time.  Skin: She is not diaphoretic.  Psychiatric: She has a normal mood and affect. Her behavior is normal. Judgment and thought content normal.  Nursing note reviewed.   No visits with results within 3 Day(s) from this visit.  Latest  known visit with results is:  Appointment on 08/12/2018  Component Date Value Ref Range Status  . WBC 08/12/2018 5.6  4.0 - 10.5 K/uL Final  . RBC 08/12/2018 4.61  3.87 - 5.11 MIL/uL Final  . Hemoglobin 08/12/2018 14.0  12.0 - 15.0 g/dL Final  . HCT 08/12/2018 41.2  36.0 - 46.0 % Final  . MCV 08/12/2018 89.4  80.0 - 100.0 fL Final  . MCH 08/12/2018 30.4  26.0 - 34.0 pg Final  . MCHC 08/12/2018 34.0  30.0 - 36.0 g/dL Final  . RDW 08/12/2018 12.9  11.5 - 15.5 % Final  . Platelets 08/12/2018 202  150 - 400 K/uL Final  . nRBC 08/12/2018 0.0  0.0 - 0.2 % Final  . Neutrophils Relative % 08/12/2018 63  % Final  . Neutro Abs 08/12/2018 3.5  1.7 - 7.7 K/uL Final  . Lymphocytes Relative 08/12/2018 28  % Final  . Lymphs Abs 08/12/2018 1.6  0.7 - 4.0 K/uL Final  . Monocytes Relative 08/12/2018 7  % Final  . Monocytes Absolute 08/12/2018 0.4  0.1 -  1.0 K/uL Final  . Eosinophils Relative 08/12/2018 2  % Final  . Eosinophils Absolute 08/12/2018 0.1  0.0 - 0.5 K/uL Final  . Basophils Relative 08/12/2018 0  % Final  . Basophils Absolute 08/12/2018 0.0  0.0 - 0.1 K/uL Final  . Immature Granulocytes 08/12/2018 0  % Final  . Abs Immature Granulocytes 08/12/2018 0.01  0.00 - 0.07 K/uL Final   Performed at [facility], [address] [place], [place] [address]  . Sodium 08/12/2018 140  135 - 145 mmol/L Final  . Potassium 08/12/2018 3.5  3.5 - 5.1 mmol/L Final  . Chloride 08/12/2018 105  98 - 111 mmol/L Final  . CO2 08/12/2018 27  22 - 32 mmol/L Final  . Glucose, Bld 08/12/2018 75  70 - 99 mg/dL Final  . BUN 08/12/2018 17  6 - 20 mg/dL Final  . Creatinine, Ser 08/12/2018 0.77  0.44 - 1.00 mg/dL Final  . Calcium 08/12/2018 9.1  8.9 - 10.3 mg/dL Final  . Total Protein 08/12/2018 7.3  6.5 - 8.1 g/dL Final  . Albumin 08/12/2018 3.8  3.5 - 5.0 g/dL Final  . AST 08/12/2018 23  15 - 41 U/L Final  . ALT 08/12/2018 26  0 - 44 U/L Final  . Alkaline Phosphatase 08/12/2018 59  38 - 126 U/L Final   . Total Bilirubin 08/12/2018 0.5  0.3 - 1.2 mg/dL Final  . GFR calc non Af Amer 08/12/2018 >60  >60 mL/min Final  . GFR calc Af Amer 08/12/2018 >60  >60 mL/min Final  . Anion gap 08/12/2018 8  5 - 15 Final   Performed at [facility], [address], [place], [place] [address]  . CA 27.29 08/12/2018 11.2  0.0 - 38.6 U/mL Final   Comment: (NOTE) Siemens Centaur Immunochemiluminometric Methodology [facility]) Values obtained with different assay methods or kits cannot be used interchangeably. Results cannot be interpreted as absolute evidence of the presence or absence of malignant disease. Performed At: [facility] [place], [place] [id number] Rush Farmer MD [id number]     Assessment:  Kaliegh Willadsen is a 48 y.o. female with stage IA right breast invasive mammary carcinoma status post wide excision and axillary lymph node dissection on 06/21/2012.  Pathology revealed a 1.7 cm grade III ductal carcinoma.  Thirteen lymph nodes were negative.  Tumor was ER/PR positive and HER-2/neu negative. BRCA 1 and 2 testing was negative.  Oncotype DX was 29 (high) which translates into a distant recurrence of 19% at 10 yrs with tamoxifen alone.  The patient received 4 cycles of Adriamycin and Cytoxan (08/17/2012 - 10/19/2012) followed by 12 weeks of Taxol (11/19/2012 - 01/25/2013).  She completed local radiation on 04/20/2013.  She was on  Tamoxifen from 04/21/2013 - 04/21/2018.    CA27.29 has been followed: 17 on 01/12/2015, 18.6 on 07/27/2015, 14.9 on 01/28/2016, 13.9 on 08/21/2016, 15.1 on 02/03/2017, 17.8 on 08/17/2017, 13.3 on 02/04/2018, and 11.2 on 08/12/2018.  Bilateral mammogram on 08/11/2016 revealed no evidence of malignancy. Breast MRI on 04/09/2017 demonstrated no evidence of malignancy. Bilateral mammogram on 08/13/2017 revealing no mammographic evidence of malignancy in either breast. There were stable post lumpectomy changes noted in the RIGHT breast,  including a far posterior seroma.   Hormone levels on 08/21/2017 revealed an estradiol level of 22.8 pg/mL and an FSH of 34.9 mIU/mL, both of which are consistent with a post-menopausal state.   Breast cancer index (BCI) on 09/07/2017 demonstrated a a high risk  of recurrence at 8.3% (3.3% - 13.1%), but a low likelihood of benefit from extended endocrine therapy.  Symptomatically, she is doing well.  She denies any breast concerns.  CA27.29 is normal.  Plan: 1. Review labs from 08/12/2018. 2. Stage IA right breast cancer             Clinically doing well.  Patient completed 5 years of endocrine therapy on 04/21/2018.  Patient did not opt for extended adjuvant therapy.             Breast MRI has been rescheduled several times- currently 08/19/2018.  Discuss rescheduling mammogram in order that patient has imaging every 6 months.  Encourage breast self exam  Discuss follow-up breast exam in 11/2018. 3.   Nausea  Patient notes nausea associated with gadolinium  Rx: ondansetron 4 mg po prior to MRI (dis: #2) 4.   Reschedule mammogram for 02/19/2019. 5.   RTC in 11/2018 for breast exam. 6.   Anticipate follow-up yearly after next visit around imaging studies.  I discussed the assessment and treatment plan with the patient.  The patient was provided an opportunity to ask questions and all were answered.  The patient agreed with the plan and demonstrated an understanding of the instructions.  The patient was advised to call back or seek an in person evaluation if the symptoms worsen or if the condition fails to improve as anticipated.  I provided 15 minutes (9:09 AM - (9:24 AM) of face-to-face video visit time during this this encounter and > 50% was spent counseling as documented under my assessment and plan.  I provided these services from the [facility] office.   Lequita Asal, MD, PhD    08/16/2018, 9:09 AM  I, Molly Dorshimer, am acting as Education administrator for [employer]. Mike Gip, MD, PhD.   I, Melissa C. Mike Gip, MD, have reviewed the above documentation for accuracy and completeness, and I agree with the above.

## 2018-08-13 ENCOUNTER — Other Ambulatory Visit: Payer: Self-pay

## 2018-08-13 LAB — CANCER ANTIGEN 27.29: CA 27.29: 11.2 U/mL (ref 0.0–38.6)

## 2018-08-16 ENCOUNTER — Ambulatory Visit: Payer: 59

## 2018-08-16 ENCOUNTER — Other Ambulatory Visit: Payer: 59

## 2018-08-16 ENCOUNTER — Encounter: Payer: Self-pay | Admitting: Hematology and Oncology

## 2018-08-16 ENCOUNTER — Inpatient Hospital Stay (HOSPITAL_BASED_OUTPATIENT_CLINIC_OR_DEPARTMENT_OTHER): Payer: 59 | Admitting: Hematology and Oncology

## 2018-08-16 ENCOUNTER — Ambulatory Visit: Payer: 59 | Admitting: Hematology and Oncology

## 2018-08-16 DIAGNOSIS — R110 Nausea: Secondary | ICD-10-CM | POA: Diagnosis not present

## 2018-08-16 DIAGNOSIS — C50911 Malignant neoplasm of unspecified site of right female breast: Secondary | ICD-10-CM

## 2018-08-16 DIAGNOSIS — Z170 Estrogen receptor positive status [ER+]: Secondary | ICD-10-CM

## 2018-08-16 MED ORDER — ONDANSETRON HCL 4 MG PO TABS: 4.0000 mg | ORAL_TABLET | Freq: Three times a day (TID) | ORAL | 0 refills | Status: DC | PRN

## 2018-08-16 NOTE — Progress Notes (Signed)
No new changes noted today. The patient Name, DOB and address has been verified by phone today. 

## 2018-08-18 ENCOUNTER — Ambulatory Visit
Admission: RE | Admit: 2018-08-18 | Discharge: 2018-08-18 | Disposition: A | Discharge status: Home / Self Care | Payer: 59 | Source: Ambulatory Visit | Attending: Hematology and Oncology | Admitting: Hematology and Oncology

## 2018-08-18 ENCOUNTER — Other Ambulatory Visit: Payer: Self-pay

## 2018-08-18 ENCOUNTER — Ambulatory Visit (HOSPITAL_COMMUNITY): Payer: 59

## 2018-08-18 DIAGNOSIS — Z1231 Encounter for screening mammogram for malignant neoplasm of breast: Secondary | ICD-10-CM | POA: Diagnosis not present

## 2018-08-18 DIAGNOSIS — C50911 Malignant neoplasm of unspecified site of right female breast: Secondary | ICD-10-CM | POA: Insufficient documentation

## 2018-08-18 DIAGNOSIS — Z170 Estrogen receptor positive status [ER+]: Secondary | ICD-10-CM | POA: Diagnosis not present

## 2018-08-18 DIAGNOSIS — Z5181 Encounter for therapeutic drug level monitoring: Secondary | ICD-10-CM | POA: Diagnosis not present

## 2018-08-18 DIAGNOSIS — Z79899 Other long term (current) drug therapy: Secondary | ICD-10-CM | POA: Insufficient documentation

## 2018-08-19 ENCOUNTER — Other Ambulatory Visit: Payer: Self-pay

## 2018-08-19 ENCOUNTER — Ambulatory Visit (HOSPITAL_COMMUNITY): Payer: 59

## 2018-08-19 ENCOUNTER — Ambulatory Visit
Admission: RE | Admit: 2018-08-19 | Discharge: 2018-08-19 | Disposition: A | Discharge status: Home / Self Care | Payer: 59 | Source: Ambulatory Visit | Attending: Urgent Care | Admitting: Urgent Care

## 2018-08-19 DIAGNOSIS — Z5181 Encounter for therapeutic drug level monitoring: Secondary | ICD-10-CM

## 2018-08-19 DIAGNOSIS — C50911 Malignant neoplasm of unspecified site of right female breast: Secondary | ICD-10-CM

## 2018-08-19 DIAGNOSIS — Z79899 Other long term (current) drug therapy: Secondary | ICD-10-CM | POA: Diagnosis not present

## 2018-08-19 DIAGNOSIS — Z170 Estrogen receptor positive status [ER+]: Secondary | ICD-10-CM | POA: Diagnosis not present

## 2018-08-19 DIAGNOSIS — Z1231 Encounter for screening mammogram for malignant neoplasm of breast: Secondary | ICD-10-CM | POA: Diagnosis not present

## 2018-08-19 DIAGNOSIS — N6322 Unspecified lump in the left breast, upper inner quadrant: Secondary | ICD-10-CM | POA: Diagnosis not present

## 2018-08-19 DIAGNOSIS — Z853 Personal history of malignant neoplasm of breast: Secondary | ICD-10-CM | POA: Diagnosis not present

## 2018-08-19 MED ORDER — GADOBUTROL 1 MMOL/ML IV SOLN
7.0000 mL | Freq: Once | INTRAVENOUS | Status: AC | PRN
  Administered 2018-08-19: 18:00:00 7 mL via INTRAVENOUS

## 2018-08-23 ENCOUNTER — Other Ambulatory Visit: Payer: Self-pay | Admitting: Hematology and Oncology

## 2018-08-23 ENCOUNTER — Telehealth: Payer: Self-pay

## 2018-08-23 ENCOUNTER — Telehealth: Payer: Self-pay | Admitting: Hematology and Oncology

## 2018-08-23 DIAGNOSIS — R928 Other abnormal and inconclusive findings on diagnostic imaging of breast: Secondary | ICD-10-CM

## 2018-08-23 NOTE — Telephone Encounter (Signed)
Re:  Abnormal breast MRI  Reviewed recent breast MRI results.  Discussed indeterminate enhancement in the left breast.  Biopsy recommended.  Patient agreed to biopsy.  Order placed.   Lequita Asal, MD

## 2018-08-23 NOTE — Telephone Encounter (Signed)
Vm left requesting for patient to contact office. (This is regarding MRI results, Dr. Mike Gip would like to review this with her)

## 2018-08-25 ENCOUNTER — Other Ambulatory Visit: Payer: Self-pay | Admitting: Hematology and Oncology

## 2018-08-25 DIAGNOSIS — R928 Other abnormal and inconclusive findings on diagnostic imaging of breast: Secondary | ICD-10-CM

## 2018-09-06 ENCOUNTER — Other Ambulatory Visit: Payer: Self-pay | Admitting: Hematology and Oncology

## 2018-09-06 ENCOUNTER — Other Ambulatory Visit: Payer: Self-pay

## 2018-09-06 ENCOUNTER — Ambulatory Visit
Admission: RE | Admit: 2018-09-06 | Discharge: 2018-09-06 | Disposition: A | Discharge status: Home / Self Care | Payer: 59 | Source: Ambulatory Visit | Attending: Hematology and Oncology | Admitting: Hematology and Oncology

## 2018-09-06 DIAGNOSIS — R928 Other abnormal and inconclusive findings on diagnostic imaging of breast: Secondary | ICD-10-CM

## 2018-09-06 DIAGNOSIS — N641 Fat necrosis of breast: Secondary | ICD-10-CM | POA: Diagnosis not present

## 2018-09-06 DIAGNOSIS — N6324 Unspecified lump in the left breast, lower inner quadrant: Secondary | ICD-10-CM | POA: Diagnosis not present

## 2018-09-06 MED ORDER — GADOBUTROL 1 MMOL/ML IV SOLN
5.0000 mL | Freq: Once | INTRAVENOUS | Status: AC | PRN
  Administered 2018-09-06: 5 mL via INTRAVENOUS

## 2018-10-11 ENCOUNTER — Ambulatory Visit: Payer: 59

## 2018-10-19 ENCOUNTER — Ambulatory Visit: Payer: Self-pay | Admitting: General Surgery

## 2018-11-12 NOTE — Progress Notes (Signed)
[facility]  [address] [place], [place] [address] Phone: [phone]  Fax: [phone]   Clinic Day:  11/15/2018  Referring physician: Glean Hess, MD  Chief Complaint: Natalie Gallagher is a 48 y.o. female with stage IA right breast cancer who is seen for 3 monthassessment.   HPI: The patient was last seen in the medical oncology clinic via telemedicine on 08/16/2018. At that time, she was doing well. She denied any breast concerns. CA27.29 was normal.  Bilateral screening mammogram on 08/18/2018 revealed no evidence of malignancy.   Bilateral breast MRI on 08/19/2018 revealed an indeterminate irregular enhancing mass in the upper inner left breast and indeterminate clumped non mass enhancement within the anteromedial left breast.  Left breast biopsy on 09/06/2018 revealed fat necrosis with no evidence of malignancy in both locations.   During the interim, she is doing "well."  Her hot flashes have resolved. Her lymphedema in her right arm has been acting up with the warm weather. She is using a compression sleeve and is being seen in the lymphedema clinic.   She reports nausea with gadolinium when she gets MRIs.    Past Medical History:  Diagnosis Date  . Breast cancer ([place]) 2014   Chemo/radiaiton- Rt.  . Breast cancer of upper-outer quadrant of right female breast [facility]) March 2014   T1c,N0,M0; BRCA neg. ER: 60%; PR: 70%, her 2 neu not over expressed.   . Cancer [facility]) 2014   right breast.right wide excision with sentinal node biopsy on 06/21/12  . Lymphedema 2015   Right Arm.  . Personal history of chemotherapy   . Personal history of radiation therapy     Past Surgical History:  Procedure Laterality Date  . BREAST BIOPSY Right 2014   Positive  . BREAST LUMPECTOMY Right 2014  . BREAST SURGERY Right 06/21/2012   right wide excision with sentinal node biopsy/axilary dissection.  . COLONOSCOPY WITH PROPOFOL N/A 10/01/2016   Procedure: COLONOSCOPY WITH PROPOFOL;  Surgeon: Robert Bellow, MD;  Location: [facility] ENDOSCOPY;  Service: Endoscopy;  Laterality: N/A;  . STRABISMUS SURGERY      Family History  Problem Relation Age of Onset  . Cancer Paternal Uncle        Lung  . Cancer Cousin        Breast   . Cancer Maternal Aunt        lung  . Diabetes Mother   . Hypertension Father   . Diabetes Father   . Breast cancer Neg Hx     Social History:  reports that she has never smoked. She has never used smokeless tobacco. She reports that she does not drink alcohol or use drugs. She is going to the gym.She lives in [place]. She is losing weight on Weight Watchers. She works at the [employer]. 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 She lives in [place].  The patient is alone today.  Allergies:  Allergies  Allergen Reactions  . Tape     The patient showed a cutaneous reaction to Telfa applied beneath a Tegaderm dressing. Telfa appears to be the primary offensive material.    Current Medications: Current Outpatient Medications  Medication Sig Dispense Refill  . Multiple Vitamins-Calcium (VIACTIV MULTI-VITAMIN PO) Take 2 tablets by mouth daily.    . ondansetron (ZOFRAN) 4 MG tablet Take 1 tablet (4 mg total) by mouth every 8 (eight) hours as needed for nausea or vomiting. (Patient not taking: Reported on  11/15/2018) 2 tablet 0  . tamoxifen (NOLVADEX) 20 MG tablet TAKE 1 TABLET BY MOUTH AT NIGHT AT BEDTIME (Patient not taking: Reported on 08/16/2018) 90 tablet 3   No current facility-administered medications for this visit.     Review of Systems  Constitutional: Negative.  Negative for chills, diaphoresis (hot flashes, resolved), fever, malaise/fatigue and weight loss (stable).       Doing "well."  HENT: Negative.  Negative for congestion, ear pain, hearing loss, nosebleeds, sinus pain and sore throat.   Eyes: Negative.  Negative for blurred  vision, double vision, photophobia and pain.  Respiratory: Negative.  Negative for cough, hemoptysis, sputum production and shortness of breath.   Cardiovascular: Negative.  Negative for chest pain, palpitations, orthopnea, leg swelling and PND.  Gastrointestinal: Negative.  Negative for abdominal pain, blood in stool, constipation, diarrhea, heartburn, melena, nausea (with gadolinium for MRI) and vomiting.  Genitourinary: Negative.  Negative for dysuria, frequency, hematuria and urgency.  Musculoskeletal: Negative.  Negative for back pain, joint pain, myalgias and neck pain.       Lymphedema of right arm  Skin: Negative.  Negative for itching and rash.  Neurological: Negative.  Negative for dizziness, tremors, sensory change, speech change, focal weakness, weakness and headaches.  Endo/Heme/Allergies: Does not bruise/bleed easily.       Vasomotor symptoms- resolved.  Post-menopausal.   Psychiatric/Behavioral: Negative.  Negative for depression and memory loss. The patient is not nervous/anxious and does not have insomnia.   All other systems reviewed and are negative.  Performance status (ECOG): 0  Vitals Blood pressure 126/80, pulse 69, temperature (!) 96.6 F (35.9 C), temperature source Tympanic, resp. rate 18, height 5' 1" (1.549 m), weight 156 lb 1.4 oz (70.8 kg), last menstrual period 07/27/2012, SpO2 100 %.   Physical Exam  Constitutional: She is oriented to person, place, and time. She appears well-developed and well-nourished. No distress.  HENT:  Head: Normocephalic and atraumatic.  Mouth/Throat: Oropharynx is clear and moist. No oropharyngeal exudate.  Curly black hair. Wearing a mask.  Eyes: Pupils are equal, round, and reactive to light. Conjunctivae and EOM are normal. No scleral icterus.  Glasses.  Brown eyes.  Neck: Normal range of motion. Neck supple.  Cardiovascular: Normal rate, regular rhythm and normal heart sounds.  No murmur heard. Pulmonary/Chest: Effort normal  and breath sounds normal. No respiratory distress. She has no wheezes. She has no rales. Right breast exhibits no inverted nipple, no mass, no nipple discharge, no skin change (post radiation edema) and no tenderness. Left breast exhibits no inverted nipple, no mass (inferior fibrocystic changes), no nipple discharge, no skin change and no tenderness.  Abdominal: Soft. Bowel sounds are normal. She exhibits no distension and no mass. There is no abdominal tenderness. There is no rebound and no guarding.  Musculoskeletal: Normal range of motion.        General: No edema.     Comments: Lymphedema, right arm  Lymphadenopathy:    She has no cervical adenopathy.    She has no axillary adenopathy.       Right: No supraclavicular adenopathy present.       Left: No supraclavicular adenopathy present.  Neurological: She is alert and oriented to person, place, and time.  Skin: Skin is warm and dry. No rash noted. She is not diaphoretic. No erythema.  Psychiatric: She has a normal mood and affect. Her behavior is normal. Judgment and thought content normal.  Nursing note and vitals reviewed.   No visits with  results within 3 Day(s) from this visit.  Latest known visit with results is:  Appointment on 08/12/2018  Component Date Value Ref Range Status  . WBC 08/12/2018 5.6  4.0 - 10.5 K/uL Final  . RBC 08/12/2018 4.61  3.87 - 5.11 MIL/uL Final  . Hemoglobin 08/12/2018 14.0  12.0 - 15.0 g/dL Final  . HCT 08/12/2018 41.2  36.0 - 46.0 % Final  . MCV 08/12/2018 89.4  80.0 - 100.0 fL Final  . MCH 08/12/2018 30.4  26.0 - 34.0 pg Final  . MCHC 08/12/2018 34.0  30.0 - 36.0 g/dL Final  . RDW 08/12/2018 12.9  11.5 - 15.5 % Final  . Platelets 08/12/2018 202  150 - 400 K/uL Final  . nRBC 08/12/2018 0.0  0.0 - 0.2 % Final  . Neutrophils Relative % 08/12/2018 63  % Final  . Neutro Abs 08/12/2018 3.5  1.7 - 7.7 K/uL Final  . Lymphocytes Relative 08/12/2018 28  % Final  . Lymphs Abs 08/12/2018 1.6  0.7 - 4.0 K/uL  Final  . Monocytes Relative 08/12/2018 7  % Final  . Monocytes Absolute 08/12/2018 0.4  0.1 - 1.0 K/uL Final  . Eosinophils Relative 08/12/2018 2  % Final  . Eosinophils Absolute 08/12/2018 0.1  0.0 - 0.5 K/uL Final  . Basophils Relative 08/12/2018 0  % Final  . Basophils Absolute 08/12/2018 0.0  0.0 - 0.1 K/uL Final  . Immature Granulocytes 08/12/2018 0  % Final  . Abs Immature Granulocytes 08/12/2018 0.01  0.00 - 0.07 K/uL Final   Performed at [facility], [address], [place], [place] [address]  . Sodium 08/12/2018 140  135 - 145 mmol/L Final  . Potassium 08/12/2018 3.5  3.5 - 5.1 mmol/L Final  . Chloride 08/12/2018 105  98 - 111 mmol/L Final  . CO2 08/12/2018 27  22 - 32 mmol/L Final  . Glucose, Bld 08/12/2018 75  70 - 99 mg/dL Final  . BUN 08/12/2018 17  6 - 20 mg/dL Final  . Creatinine, Ser 08/12/2018 0.77  0.44 - 1.00 mg/dL Final  . Calcium 08/12/2018 9.1  8.9 - 10.3 mg/dL Final  . Total Protein 08/12/2018 7.3  6.5 - 8.1 g/dL Final  . Albumin 08/12/2018 3.8  3.5 - 5.0 g/dL Final  . AST 08/12/2018 23  15 - 41 U/L Final  . ALT 08/12/2018 26  0 - 44 U/L Final  . Alkaline Phosphatase 08/12/2018 59  38 - 126 U/L Final  . Total Bilirubin 08/12/2018 0.5  0.3 - 1.2 mg/dL Final  . GFR calc non Af Amer 08/12/2018 >60  >60 mL/min Final  . GFR calc Af Amer 08/12/2018 >60  >60 mL/min Final  . Anion gap 08/12/2018 8  5 - 15 Final   Performed at [facility], [address] [place], [place] [address]  . CA 27.29 08/12/2018 11.2  0.0 - 38.6 U/mL Final   Comment: (NOTE) Siemens Centaur Immunochemiluminometric Methodology [facility]) Values obtained with different assay methods or kits cannot be used interchangeably. Results cannot be interpreted as absolute evidence of the presence or absence of malignant disease. Performed At: [facility] [place], [place] [id number] Rush Farmer MD ON:[id number]     Assessment:  Natalie Gallagher is a 48  y.o. female with stage IA right breast invasive mammary carcinomastatus post wide excision and axillary lymph node dissection on 06/21/2012. Pathology revealed a 1.7 cm grade III ductal carcinoma. Thirteen lymph nodes were negative. Tumor was  ER/PR positive and HER-2/neu negative. BRCA 1 and 2 testing was negative. Oncotype DXwas 29 (high) which translates into a distant recurrence of 19% at 10 yrs with tamoxifen alone.  The patient received 4 cycles ofAdriamycin and Cytoxan(08/17/2012 - 10/19/2012) followed by 12 weeks of Taxol(11/19/2012 - 01/25/2013). She completed local radiationon 04/20/2013. She was on Tamoxifen from 04/21/2013 - 04/21/2018.   CA27.29has been followed: 17 on 01/12/2015, 18.6 on 07/27/2015, 14.9 on 01/28/2016, 13.9 on 08/21/2016, 15.1 on 02/03/2017, 17.8 on 08/17/2017, 13.3 on 02/04/2018, and 11.2 on 08/12/2018.  Bilateral mammogramon 08/11/2016 revealed no evidence of malignancy.Bilateral breast MRIon 04/09/2017 demonstrated no evidence of malignancy. Bilateral mammogramon 08/13/2017 revealing no mammographic evidence of malignancy in either breast. There were stable post lumpectomy changes noted in the RIGHT breast, including a far posterior seroma.   Bilateral screening mammogram on 08/18/2018 revealed no evidence of malignancy. Bilateral breast MRI on 08/19/2018 revealed an indeterminate irregular enhancing mass in the upper inner left breast and indeterminate clumped non mass enhancement within the anteromedial left breast.  Left breast biopsy on 09/06/2018 revealed fat necrosis with no evidence of malignancy in both locations.   Hormone levelson 08/21/2017 revealed an estradiol level of 22.8 pg/mL and an FSH of 34.9 mIU/mL, both of which are consistent with a post-menopausal state.   Breast cancer index (BCI)on 09/07/2017 demonstrated a a high risk of recurrence at 8.3% (3.3% - 13.1%), but a low likelihood of benefit from extended endocrine therapy.   Symptomatically, she is doing well.  Vasomotor symptoms have resolved.  She has lymphedema and is using a compression sleeve and going to the lymphedema clinic.  Plan: 1.   Review labs from 08/12/2018.  2.   Stage IA right breastcancer   Clinically, she is doing well.   Exam reveals no evidence of recurrent disease.  Review interval mammogram and breast MRI.     Breast MRI revealed indeterminate irregular enhancing mass in the upper inner quadrant.   Left breast biopsy revealed fat necrosis with no malignancy  Discuss plans for follow-up breast MRI in 6 months.  She completed 5 years of endocrine therapy.  She opted for no extended adjuvant endocrine therapy.  Continue surveillance. 3.   Breast MRI on 02/21/2019 4.   RTC after breast MRI for MD assessment, labs (CBC with diff, CMP, CA27.29), and review of breast MRI.      I discussed the assessment and treatment plan with the patient.  The patient was provided an opportunity to ask questions and all were answered.  The patient agreed with the plan and demonstrated an understanding of the instructions.  The patient was advised to call back if the symptoms worsen or if the condition fails to improve as anticipated.  I provided 15 minutes of face-to-face time during this this encounter and > 50% was spent counseling as documented under my assessment and plan.    Lequita Asal, MD, PhD    11/15/2018, 9:13 AM  I, Cloyde Reams Dorshimer, am acting as Education administrator for [employer]. Mike Gip, MD, PhD.  I, Melissa C. Mike Gip, MD, have reviewed the above documentation for accuracy and completeness, and I agree with the above.

## 2018-11-15 ENCOUNTER — Inpatient Hospital Stay
Payer: No Typology Code available for payment source | Attending: Hematology and Oncology | Admitting: Hematology and Oncology

## 2018-11-15 ENCOUNTER — Encounter: Payer: Self-pay | Admitting: Hematology and Oncology

## 2018-11-15 ENCOUNTER — Other Ambulatory Visit: Payer: Self-pay

## 2018-11-15 VITALS — BP 126/80 | HR 69 | Temp 96.6°F | Resp 18 | Ht 61.0 in | Wt 156.1 lb

## 2018-11-15 DIAGNOSIS — Z79899 Other long term (current) drug therapy: Secondary | ICD-10-CM | POA: Diagnosis not present

## 2018-11-15 DIAGNOSIS — Z8249 Family history of ischemic heart disease and other diseases of the circulatory system: Secondary | ICD-10-CM | POA: Insufficient documentation

## 2018-11-15 DIAGNOSIS — R110 Nausea: Secondary | ICD-10-CM | POA: Diagnosis not present

## 2018-11-15 DIAGNOSIS — I890 Lymphedema, not elsewhere classified: Secondary | ICD-10-CM | POA: Insufficient documentation

## 2018-11-15 DIAGNOSIS — C50212 Malignant neoplasm of upper-inner quadrant of left female breast: Secondary | ICD-10-CM | POA: Diagnosis not present

## 2018-11-15 DIAGNOSIS — Z888 Allergy status to other drugs, medicaments and biological substances status: Secondary | ICD-10-CM | POA: Diagnosis not present

## 2018-11-15 DIAGNOSIS — Z801 Family history of malignant neoplasm of trachea, bronchus and lung: Secondary | ICD-10-CM | POA: Insufficient documentation

## 2018-11-15 DIAGNOSIS — Z923 Personal history of irradiation: Secondary | ICD-10-CM | POA: Insufficient documentation

## 2018-11-15 DIAGNOSIS — R928 Other abnormal and inconclusive findings on diagnostic imaging of breast: Secondary | ICD-10-CM | POA: Diagnosis not present

## 2018-11-15 DIAGNOSIS — Z803 Family history of malignant neoplasm of breast: Secondary | ICD-10-CM | POA: Diagnosis not present

## 2018-11-15 DIAGNOSIS — Z170 Estrogen receptor positive status [ER+]: Secondary | ICD-10-CM | POA: Diagnosis not present

## 2018-11-15 DIAGNOSIS — C50911 Malignant neoplasm of unspecified site of right female breast: Secondary | ICD-10-CM | POA: Diagnosis not present

## 2018-11-15 DIAGNOSIS — Z9221 Personal history of antineoplastic chemotherapy: Secondary | ICD-10-CM | POA: Diagnosis not present

## 2018-11-15 DIAGNOSIS — Z833 Family history of diabetes mellitus: Secondary | ICD-10-CM | POA: Diagnosis not present

## 2018-11-15 NOTE — Progress Notes (Signed)
No new changes noted today 

## 2018-11-25 ENCOUNTER — Ambulatory Visit: Payer: Self-pay | Admitting: General Surgery

## 2018-12-03 ENCOUNTER — Other Ambulatory Visit: Payer: Self-pay

## 2018-12-03 ENCOUNTER — Ambulatory Visit
Admission: EM | Admit: 2018-12-03 | Discharge: 2018-12-03 | Disposition: A | Discharge status: Home / Self Care | Payer: No Typology Code available for payment source | Attending: Family Medicine | Admitting: Family Medicine

## 2018-12-03 DIAGNOSIS — R1030 Lower abdominal pain, unspecified: Secondary | ICD-10-CM

## 2018-12-03 MED ORDER — METRONIDAZOLE 500 MG PO TABS: 500.0000 mg | ORAL_TABLET | Freq: Three times a day (TID) | ORAL | 0 refills | Status: AC

## 2018-12-03 MED ORDER — CIPROFLOXACIN HCL 500 MG PO TABS: 500.0000 mg | ORAL_TABLET | Freq: Two times a day (BID) | ORAL | 0 refills | Status: DC

## 2018-12-03 NOTE — Discharge Instructions (Addendum)
Antibiotics as prescribed  If persists, will need CT scan.  Take care  Dr. Lacinda Axon

## 2018-12-03 NOTE — ED Provider Notes (Signed)
[facility]    CSN: [id number] Arrival date & time: 12/03/18  1046  History   Chief Complaint Chief Complaint  Patient presents with  . Abdominal Pain   HPI  48 year old female presents with lower abdominal pain.  Patient reports that her symptoms started on Monday.  She reports low back pain and lower abdominal pain.  She states that her pain has been getting progressively worse.  She states that her pain is currently 9/10 in severity.  Located throughout the lower abdomen.  Patient reports that she has had ongoing constipation and diarrhea as well.  On Tuesday she had a low-grade fever of 100.8.  She has some nausea at that time.  Patient has had no additional fever.  Continues to have low back pain and lower abdominal pain.  No urinary symptoms.  No medications or interventions tried.  No known relieving factors.  No known exacerbating factors.  No other complaints.  PMH, Surgical Hx, Family Hx, Social History reviewed and updated as below.  Past Medical History:  Diagnosis Date  . Breast cancer ([place]) 2014   Chemo/radiaiton- Rt.  . Breast cancer of upper-outer quadrant of right female breast [facility]) March 2014   T1c,N0,M0; BRCA neg. ER: 60%; PR: 70%, her 2 neu not over expressed.   . Cancer [facility]) 2014   right breast.right wide excision with sentinal node biopsy on 06/21/12  . Lymphedema 2015   Right Arm.  . Personal history of chemotherapy   . Personal history of radiation therapy     Patient Active Problem List   Diagnosis Date Noted  . Lymphedema of right upper extremity 09/29/2017  . Malignant neoplasm of right breast in female, estrogen receptor positive ([place]) 07/20/2013  . BMI 29.0-29.9,adult 11/28/2011    Past Surgical History:  Procedure Laterality Date  . BREAST BIOPSY Right 2014   Positive  . BREAST LUMPECTOMY Right 2014  . BREAST SURGERY Right 06/21/2012   right wide excision with sentinal node biopsy/axilary dissection.  . COLONOSCOPY WITH  PROPOFOL N/A 10/01/2016   Procedure: COLONOSCOPY WITH PROPOFOL;  Surgeon: Robert Bellow, MD;  Location: [facility] ENDOSCOPY;  Service: Endoscopy;  Laterality: N/A;  . STRABISMUS SURGERY      OB History    Gravida  0   Para      Term      Preterm      AB      Living        SAB      TAB      Ectopic      Multiple      Live Births           Obstetric Comments  Age first period 28         Home Medications    Prior to Admission medications   Medication Sig Start Date End Date Taking? Authorizing Provider  Multiple Vitamins-Calcium (VIACTIV MULTI-VITAMIN PO) Take 2 tablets by mouth daily.   Yes [provider]  ondansetron (ZOFRAN) 4 MG tablet Take 1 tablet (4 mg total) by mouth every 8 (eight) hours as needed for nausea or vomiting. 08/16/18  Yes Corcoran, Drue Second, MD  ciprofloxacin (CIPRO) 500 MG tablet Take 1 tablet (500 mg total) by mouth 2 (two) times daily. 12/03/18   Coral Spikes, DO  metroNIDAZOLE (FLAGYL) 500 MG tablet Take 1 tablet (500 mg total) by mouth 3 (three) times daily for 7 days. 12/03/18 12/10/18  Coral Spikes, DO    Family  History Family History  Problem Relation Age of Onset  . Cancer Paternal Uncle        Lung  . Cancer Cousin        Breast   . Cancer Maternal Aunt        lung  . Diabetes Mother   . Hypertension Father   . Diabetes Father   . Breast cancer Neg Hx     Social History Social History   Tobacco Use  . Smoking status: Never Smoker  . Smokeless tobacco: Never Used  Substance Use Topics  . Alcohol use: No  . Drug use: No     Allergies   Tape   Review of Systems Review of Systems  Constitutional: Positive for fever.  Gastrointestinal: Positive for abdominal pain, constipation, diarrhea and nausea.  Genitourinary: Negative.   Musculoskeletal: Positive for back pain.   Physical Exam Triage Vital Signs ED Triage Vitals  Enc Vitals Group     BP 12/03/18 1100 (!) 144/93     Pulse Rate 12/03/18 1100  71     Resp 12/03/18 1100 18     Temp 12/03/18 1100 99.2 F (37.3 C)     Temp Source 12/03/18 1100 Oral     SpO2 12/03/18 1100 100 %     Weight 12/03/18 1058 158 lb (71.7 kg)     Height 12/03/18 1058 5' 1" (1.549 m)     Head Circumference --      Peak Flow --      Pain Score 12/03/18 1058 9     Pain Loc --      Pain Edu? --      Excl. in [place]? --    Updated Vital Signs BP (!) 144/93 (BP Location: Left Arm)   Pulse 71   Temp 99.2 F (37.3 C) (Oral)   Resp 18   Ht 5' 1" (1.549 m)   Wt 71.7 kg   LMP 07/27/2012   SpO2 100%   BMI 29.85 kg/m   Visual Acuity Right Eye Distance:   Left Eye Distance:   Bilateral Distance:    Right Eye Near:   Left Eye Near:    Bilateral Near:     Physical Exam Vitals signs and nursing note reviewed.  Constitutional:      General: She is not in acute distress.    Appearance: Normal appearance.  HENT:     Head: Normocephalic and atraumatic.  Eyes:     General:        Right eye: No discharge.        Left eye: No discharge.     Conjunctiva/sclera: Conjunctivae normal.  Cardiovascular:     Rate and Rhythm: Normal rate and regular rhythm.     Heart sounds: No murmur.  Pulmonary:     Effort: Pulmonary effort is normal.     Breath sounds: Normal breath sounds. No wheezing, rhonchi or rales.  Abdominal:     General: There is no distension.     Palpations: Abdomen is soft.     Comments: Tender to palpation throughout the lower abdomen.  LLQ > RLQ.  Neurological:     Mental Status: She is alert.  Psychiatric:        Mood and Affect: Mood normal.        Behavior: Behavior normal.    UC Treatments / Results  Labs (all labs ordered are listed, but only abnormal results are displayed) Labs Reviewed - No data to display  EKG  Radiology No results found.  Procedures Procedures (including critical care time)  Medications Ordered in UC Medications - No data to display  Initial Impression / Assessment and Plan / UC Course  I  have reviewed the triage vital signs and the nursing notes.  Pertinent labs & imaging results that were available during my care of the patient were reviewed by me and considered in my medical decision making (see chart for details).    48 year old female presents with lower abdominal pain.  Suspect diverticulitis.  Attempted to arrange CT scan but prior approval took too long for the patient.  She wanted to be treated empirically instead.  Treating with Cipro and Flagyl.  Final Clinical Impressions(s) / UC Diagnoses   Final diagnoses:  Lower abdominal pain     Discharge Instructions     Antibiotics as prescribed  If persists, will need CT scan.  Take care  Dr. Lacinda Axon    ED Prescriptions    Medication Sig Dispense Auth. Provider   ciprofloxacin (CIPRO) 500 MG tablet Take 1 tablet (500 mg total) by mouth 2 (two) times daily. 14 tablet Tangela Dolliver G, DO   metroNIDAZOLE (FLAGYL) 500 MG tablet Take 1 tablet (500 mg total) by mouth 3 (three) times daily for 7 days. 21 tablet Coral Spikes, DO     Controlled Substance Prescriptions [place] Controlled Substance Registry consulted? Not Applicable   Coral Spikes, DO 12/03/18 1330

## 2018-12-03 NOTE — ED Triage Notes (Signed)
Patient complains of lower abdominal pain/cramps, fever, that started on Monday. Patient states that all week she has been back and forth with diarrhea and constipation and increased burping. Patient states that cramping has been intermittent.

## 2018-12-09 ENCOUNTER — Ambulatory Visit: Payer: 59 | Admitting: Internal Medicine

## 2018-12-15 ENCOUNTER — Ambulatory Visit (INDEPENDENT_AMBULATORY_CARE_PROVIDER_SITE_OTHER): Payer: No Typology Code available for payment source | Admitting: Internal Medicine

## 2018-12-15 ENCOUNTER — Other Ambulatory Visit: Payer: Self-pay

## 2018-12-15 ENCOUNTER — Encounter: Payer: Self-pay | Admitting: Internal Medicine

## 2018-12-15 VITALS — BP 128/78 | HR 63 | Ht 61.0 in | Wt 155.0 lb

## 2018-12-15 DIAGNOSIS — K5792 Diverticulitis of intestine, part unspecified, without perforation or abscess without bleeding: Secondary | ICD-10-CM | POA: Diagnosis not present

## 2018-12-15 NOTE — Progress Notes (Signed)
Date:  12/15/2018   Name:  Natalie Gallagher   DOB:  04-Apr-1971   MRN:  [id number]   Chief Complaint: Abdominal Pain (Was seen in [facility] in [place] on 12/03/18 for diverticulitis. Treated with 7 days of cipro and flagyl. Would like to see a GI for this problem- Dr Allen Norris. )  Abdominal Pain The current episode started 1 to 4 weeks ago (went to [facility] and was treated for diverticulitis). The problem has been resolved. The pain is located in the LLQ (now resolved). Pertinent negatives include no constipation, diarrhea, fever, headaches or nausea.  She completed a course of Flagyl and Cipro 4 days ago.  She continues to do well with no more fever, and normal bowel movements.  Review of Systems  Constitutional: Negative for chills, fatigue and fever.  Respiratory: Negative for chest tightness, shortness of breath and wheezing.   Cardiovascular: Negative for chest pain and palpitations.  Gastrointestinal: Negative for abdominal pain, blood in stool, constipation, diarrhea and nausea.  Neurological: Negative for dizziness and headaches.    Patient Active Problem List   Diagnosis Date Noted  . Lymphedema of right upper extremity 09/29/2017  . Malignant neoplasm of right breast in female, estrogen receptor positive ([place]) 07/20/2013  . BMI 29.0-29.9,adult 11/28/2011    Allergies  Allergen Reactions  . Tape     The patient showed a cutaneous reaction to Telfa applied beneath a Tegaderm dressing. Telfa appears to be the primary offensive material.    Past Surgical History:  Procedure Laterality Date  . BREAST BIOPSY Right 2014   Positive  . BREAST LUMPECTOMY Right 2014  . BREAST SURGERY Right 06/21/2012   right wide excision with sentinal node biopsy/axilary dissection.  . COLONOSCOPY WITH PROPOFOL N/A 10/01/2016   Procedure: COLONOSCOPY WITH PROPOFOL;  Surgeon: Robert Bellow, MD;  Location: [facility] ENDOSCOPY;  Service: Endoscopy;  Laterality: N/A;  . STRABISMUS SURGERY      Social History    Tobacco Use  . Smoking status: Never Smoker  . Smokeless tobacco: Never Used  Substance Use Topics  . Alcohol use: No  . Drug use: No     Medication list has been reviewed and updated.  Current Meds  Medication Sig  . Multiple Vitamins-Calcium (VIACTIV MULTI-VITAMIN PO) Take 2 tablets by mouth daily.    PHQ 2/9 Scores 12/15/2018 02/04/2018 01/08/2017 12/21/2014  PHQ - 2 Score 0 0 0 0    BP Readings from Last 3 Encounters:  12/15/18 128/78  12/03/18 (!) 144/93  11/15/18 126/80    Physical Exam Vitals signs and nursing note reviewed.  Constitutional:      General: She is not in acute distress.    Appearance: She is well-developed.  HENT:     Head: Normocephalic and atraumatic.  Pulmonary:     Effort: Pulmonary effort is normal. No respiratory distress.  Abdominal:     General: Abdomen is flat. Bowel sounds are normal. There is no distension.     Palpations: Abdomen is soft.     Tenderness: There is no abdominal tenderness.  Musculoskeletal: Normal range of motion.  Skin:    General: Skin is warm and dry.     Findings: No rash.  Neurological:     Mental Status: She is alert and oriented to person, place, and time.  Psychiatric:        Behavior: Behavior normal.        Thought Content: Thought content normal.     Wt Readings from Last 3  Encounters:  12/15/18 155 lb (70.3 kg)  12/03/18 158 lb (71.7 kg)  11/15/18 156 lb 1.4 oz (70.8 kg)    BP 128/78   Pulse 63   Ht 5\' 1"  (1.549 m)   Wt 155 lb (70.3 kg)   LMP 07/27/2012   SpO2 100%   BMI 29.29 kg/m   Assessment and Plan: 1. Diverticulitis Appears resolved after antibiotics May advance diet  Last colonoscopy in 2018 was normal (done due to history of breast cancer) May need repeat colonoscopy - Ambulatory referral to Gastroenterology   Partially dictated using Dragon software. Any errors are unintentional.  Halina Maidens, MD [facility]  12/15/2018

## 2019-01-14 ENCOUNTER — Telehealth: Payer: Self-pay

## 2019-01-14 NOTE — Telephone Encounter (Signed)
Patient called saying she has not had a menstrual period since 2014 due to Chemo. She confirmed with you last visit that she is postmenopausal. However, she started her period today with normal flow.  Informed patient she will need to schedule an appt with a GYN physician to discuss these symptoms because this can be concerning.   She verb understanding of this.

## 2019-01-14 NOTE — Telephone Encounter (Signed)
Patient called into the office and wanteing to know what to do she has started a light flow/ bleed vaginal area. I have informed her to contact her GYN/ OB the patient states she doesn't have one that her PCP does her physical. I have informed the patient to contact her PCP office and get a appointment.The patient states she has not had any bleeding (2014). The patient was understanding and agreeable.

## 2019-01-20 ENCOUNTER — Other Ambulatory Visit: Payer: Self-pay

## 2019-01-20 ENCOUNTER — Ambulatory Visit (INDEPENDENT_AMBULATORY_CARE_PROVIDER_SITE_OTHER): Payer: No Typology Code available for payment source | Admitting: Gastroenterology

## 2019-01-20 ENCOUNTER — Encounter: Payer: Self-pay | Admitting: Gastroenterology

## 2019-01-20 VITALS — BP 117/79 | HR 67 | Temp 97.7°F | Ht 61.0 in | Wt 155.4 lb

## 2019-01-20 DIAGNOSIS — K5732 Diverticulitis of large intestine without perforation or abscess without bleeding: Secondary | ICD-10-CM

## 2019-01-20 NOTE — Progress Notes (Signed)
Gastroenterology Consultation  Referring Provider:     Glean Hess, MD Primary Care Physician:  Glean Hess, MD Primary Gastroenterologist:  Dr. Allen Norris     Reason for Consultation:     Possible diverticulitis        HPI:   Natalie Gallagher is a 48 y.o. y/o female referred for consultation & management of possible diverticulitis by Dr. Army Melia, Jesse Sans, MD.  This patient comes to see me today after being sent to Dr. Bary Castilla two years ago for a colonoscopy.  The patient had a colonoscopy at that time that was reported to be normal without any mention of diverticulosis.  The patient also was recommended to have a repeat colonoscopy in 10 years after that last colonoscopy.  She now comes in with a report of a week long episode of abdominal pain for which she went to urgent care.  At that time the patient was started on antibiotics and reports that a few days later her abdominal pain went away and has not returned.  There is no report of any black stools or bloody stools.  She denies any constipation but does state that she does have mushy stools but that has been chronic.  There is no report of any change in bowel habits. The patient was seen by her primary care provider after being seen by urgent care and was recommended to follow-up with me.  The patient denies any family history of colon cancer or colon polyps.  Past Medical History:  Diagnosis Date  . Breast cancer ([place]) 2014   Chemo/radiaiton- Rt.  . Breast cancer of upper-outer quadrant of right female breast [facility]) March 2014   T1c,N0,M0; BRCA neg. ER: 60%; PR: 70%, her 2 neu not over expressed.   . Cancer [facility]) 2014   right breast.right wide excision with sentinal node biopsy on 06/21/12  . Lymphedema 2015   Right Arm.  . Personal history of chemotherapy   . Personal history of radiation therapy     Past Surgical History:  Procedure Laterality Date  . BREAST BIOPSY Right 2014   Positive  . BREAST LUMPECTOMY Right 2014  .  BREAST SURGERY Right 06/21/2012   right wide excision with sentinal node biopsy/axilary dissection.  . COLONOSCOPY WITH PROPOFOL N/A 10/01/2016   Procedure: COLONOSCOPY WITH PROPOFOL;  Surgeon: Robert Bellow, MD;  Location: [facility] ENDOSCOPY;  Service: Endoscopy;  Laterality: N/A;  . STRABISMUS SURGERY      Prior to Admission medications   Medication Sig Start Date End Date Taking? Authorizing Provider  Multiple Vitamins-Calcium (VIACTIV MULTI-VITAMIN PO) Take 2 tablets by mouth daily.   Yes [provider]    Family History  Problem Relation Age of Onset  . Cancer Paternal Uncle        Lung  . Cancer Cousin        Breast   . Cancer Maternal Aunt        lung  . Diabetes Mother   . Hypertension Father   . Diabetes Father   . Breast cancer Neg Hx      Social History   Tobacco Use  . Smoking status: Never Smoker  . Smokeless tobacco: Never Used  Substance Use Topics  . Alcohol use: No  . Drug use: No    Allergies as of 01/20/2019 - Review Complete 01/20/2019  Allergen Reaction Noted  . Tape  08/18/2012    Review of Systems:    All systems reviewed and negative except  where noted in HPI.   Physical Exam:  BP 117/79   Pulse 67   Temp 97.7 F (36.5 C) (Temporal)   Ht '5\' 1"'$  (1.549 m)   Wt 155 lb 6.4 oz (70.5 kg)   LMP 07/27/2012   BMI 29.36 kg/m  Patient's last menstrual period was 07/27/2012. General:   Alert,  Well-developed, well-nourished, pleasant and cooperative in NAD Head:  Normocephalic and atraumatic. Eyes:  Sclera clear, no icterus.   Conjunctiva pink. Ears:  Normal auditory acuity. Nose:  No deformity, discharge, or lesions. Mouth:  No deformity or lesions,oropharynx pink & moist. Neck:  Supple; no masses or thyromegaly. Lungs:  Respirations even and unlabored.  Clear throughout to auscultation.   No wheezes, crackles, or rhonchi. No acute distress. Heart:  Regular rate and rhythm; no murmurs, clicks, rubs, or gallops. Abdomen:  Normal  bowel sounds.  No bruits.  Soft, non-tender and non-distended without masses, hepatosplenomegaly or hernias noted.  No guarding or rebound tenderness.  Negative Carnett sign.   Rectal:  Deferred.  Msk:  Symmetrical without gross deformities.  Good, equal movement & strength bilaterally. Pulses:  Normal pulses noted. Extremities:  No clubbing or edema.  No cyanosis. Neurologic:  Alert and oriented x3;  grossly normal neurologically. Skin:  Intact without significant lesions or rashes.  No jaundice. Lymph Nodes:  No significant cervical adenopathy. Psych:  Alert and cooperative. Normal mood and affect.  Imaging Studies: No results found.  Assessment and Plan:   Natalie Gallagher is a 48 y.o. y/o female who is here today for a consultation for abdominal pain that she reports to have been in the left side of the abdomen that quickly improved after being given antibiotics.  The patient had a colonoscopy by a surgeon who did not recommend a repeat colonoscopy for 10 years and also did not mention any diverticulitis.  The patient was in the urgent care center and was thought to have diverticulitis although a confirmatory CT scan was not done.  The patient is asymptomatic at this time and has been told that her diverticulosis may have been missed by the surgeon who did her colonoscopy and she may very well have diverticulosis with a recent attack of diverticulitis.  The patient has also been told that since it has only happened 1 time and may not happen again I would recommend no further work-up unless she has another attack of diverticulitis whereupon she may end up getting a CT scan at that time to prove that it is diverticulitis versus the patient ending up with a repeat colonoscopy to look for the extent of her diverticulosis.  The patient has been explained the plan and agrees with it.  This visit consisted of 40 minutes face to face contact with myself and at least 50% of this time was spent in counseling  and education regarding diagnosis, treatment options, medication management, risks and benefits of treatment.  Lucilla Lame, MD. [facility]    Note: This dictation was prepared with Dragon dictation along with smaller phrase technology. Any transcriptional errors that result from this process are unintentional.

## 2019-01-21 ENCOUNTER — Other Ambulatory Visit: Payer: Self-pay

## 2019-01-21 ENCOUNTER — Encounter: Payer: Self-pay | Admitting: Certified Nurse Midwife

## 2019-01-21 ENCOUNTER — Ambulatory Visit: Payer: No Typology Code available for payment source | Admitting: Certified Nurse Midwife

## 2019-01-21 ENCOUNTER — Other Ambulatory Visit (HOSPITAL_COMMUNITY)
Admission: RE | Admit: 2019-01-21 | Discharge: 2019-01-21 | Disposition: A | Discharge status: Home / Self Care | Payer: No Typology Code available for payment source | Source: Ambulatory Visit | Attending: Certified Nurse Midwife | Admitting: Certified Nurse Midwife

## 2019-01-21 VITALS — BP 127/82 | HR 74 | Ht 61.0 in | Wt 157.6 lb

## 2019-01-21 DIAGNOSIS — Z113 Encounter for screening for infections with a predominantly sexual mode of transmission: Secondary | ICD-10-CM | POA: Diagnosis present

## 2019-01-21 DIAGNOSIS — N950 Postmenopausal bleeding: Secondary | ICD-10-CM

## 2019-01-21 DIAGNOSIS — Z853 Personal history of malignant neoplasm of breast: Secondary | ICD-10-CM

## 2019-01-21 DIAGNOSIS — C50911 Malignant neoplasm of unspecified site of right female breast: Secondary | ICD-10-CM

## 2019-01-21 DIAGNOSIS — Z170 Estrogen receptor positive status [ER+]: Secondary | ICD-10-CM

## 2019-01-21 NOTE — Progress Notes (Signed)
GYN ENCOUNTER NOTE  Subjective:       Natalie Gallagher is a 48 y.o. G0P0 female is here for gynecologic evaluation of the following issues:  1. Postmenopausal bleeding that started one (1) week ago and lasted for five (5) days; extremely heavy for one (1) to two (2) days then tapered to nothing  2. Requests all labs for upcoming appointments to be collected in office today  No pain or cramping associated. History significant for breast cancer. Previously used Tamoxifen.   Denies difficulty breathing or respiratory distress, chest pain, abdominal pain, dysuria, and leg pain or swelling.    Gynecologic History  Patient's last menstrual period was 07/27/2012 (exact date).  Contraception: post menopausal status  Last Pap: 01/2017. Results were: Negative/Negative  Last breast MRI 08/2018. Results were: BI-RADS 4  Obstetric History  OB History  Gravida Para Term Preterm AB Living  0            SAB TAB Ectopic Multiple Live Births             Obstetric Comments  Age first period 85    Past Medical History:  Diagnosis Date  . Breast cancer ([place]) 2014   Chemo/radiaiton- Rt.  . Breast cancer of upper-outer quadrant of right female breast [facility]) March 2014   T1c,N0,M0; BRCA neg. ER: 60%; PR: 70%, her 2 neu not over expressed.   . Cancer [facility]) 2014   right breast.right wide excision with sentinal node biopsy on 06/21/12  . Lymphedema 2015   Right Arm.  . Personal history of chemotherapy   . Personal history of radiation therapy     Past Surgical History:  Procedure Laterality Date  . BREAST BIOPSY Right 2014   Positive  . BREAST LUMPECTOMY Right 2014  . BREAST SURGERY Right 06/21/2012   right wide excision with sentinal node biopsy/axilary dissection.  . COLONOSCOPY WITH PROPOFOL N/A 10/01/2016   Procedure: COLONOSCOPY WITH PROPOFOL;  Surgeon: Robert Bellow, MD;  Location: [facility] ENDOSCOPY;  Service: Endoscopy;  Laterality: N/A;  . STRABISMUS SURGERY      Current Outpatient  Medications on File Prior to Visit  Medication Sig Dispense Refill  . Multiple Vitamins-Calcium (VIACTIV MULTI-VITAMIN PO) Take 2 tablets by mouth daily.     No current facility-administered medications on file prior to visit.     Allergies  Allergen Reactions  . Tape     The patient showed a cutaneous reaction to Telfa applied beneath a Tegaderm dressing. Telfa appears to be the primary offensive material.    Social History   Socioeconomic History  . Marital status: Divorced    Spouse name: Not on file  . Number of children: 0  . Years of education: Not on file  . Highest education level: Not on file  Occupational History  . Not on file  Social Needs  . Financial resource strain: Not on file  . Food insecurity    Worry: Not on file    Inability: Not on file  . Transportation needs    Medical: Not on file    Non-medical: Not on file  Tobacco Use  . Smoking status: Never Smoker  . Smokeless tobacco: Never Used  Substance and Sexual Activity  . Alcohol use: No  . Drug use: No  . Sexual activity: Not Currently    Birth control/protection: None  Lifestyle  . Physical activity    Days per week: Not on file    Minutes per session: Not on file  .  Stress: Not on file  Relationships  . Social Herbalist on phone: Not on file    Gets together: Not on file    Attends religious service: Not on file    Active member of club or organization: Not on file    Attends meetings of clubs or organizations: Not on file    Relationship status: Not on file  . Intimate partner violence    Fear of current or ex partner: Not on file    Emotionally abused: Not on file    Physically abused: Not on file    Forced sexual activity: Not on file  Other Topics Concern  . Not on file  Social History Narrative  . Not on file    Family History  Problem Relation Age of Onset  . Cancer Paternal Uncle        Lung  . Cancer Cousin        Breast   . Cancer Maternal Aunt         lung  . Diabetes Mother   . Hypertension Father   . Diabetes Father   . Breast cancer Neg Hx   . Ovarian cancer Neg Hx   . Colon cancer Neg Hx     The following portions of the patient's history were reviewed and updated as appropriate: allergies, current medications, past family history, past medical history, past social history, past surgical history and problem list.  Review of Systems  ROS negative except as noted above. Information obtained from patient.   Objective:   BP 127/82   Pulse 74   Ht '5\' 1"'$  (1.549 m)   Wt 157 lb 9.6 oz (71.5 kg)   LMP 07/27/2012 (Exact Date)   BMI 29.78 kg/m    CONSTITUTIONAL: Well-developed, well-nourished female in no acute distress.   PELVIC:  External Genitalia: Normal  Vagina: Normal  Cervix: Normal   MUSCULOSKELETAL: Normal range of motion. No tenderness.  No cyanosis, clubbing, or edema.  Assessment:   1. Postmenopausal bleeding  - CBC - Comprehensive metabolic panel - Lipid panel - TSH - Cervicovaginal ancillary only  2. History of cancer of right breast   3. Malignant neoplasm of right breast in female, estrogen receptor positive, unspecified site of breast ([place])  - Cancer antigen 27.29  4. Screen for STD (sexually transmitted disease)  - Cervicovaginal ancillary only     Plan:   Labs: see orders.  Vaginal swab collected, will contact patient with results.   Discussed evaluation and management of postmenopausal bleeding. Agrees to endometrial biopsy today, see note below.   Reviewed red flag symptoms and when to call.   RTC x 1-2 weeks for ultrasound and results review or sooner if needed.    Diona Fanti, CNM [facility], [facility] 01/21/19 4:51 PM   Endometrial Biopsy Note:  A timeout protocol was performed prior to initiating the procedure  Vaginal speculum was inserted, and the cervix was visualized.   The cervix was cleansed with antiseptic solution.  The device was  inserted through the cervical canal, into the uterine cavity, and up to the fundus. A tenaculum was placed on the cervix.  The depth of the uterus was determined with the instrument The instrument was withdrawn as it was rotated 3-4 times to obtain the sample which was sent in formalin to pathology    Followup: The patient tolerated the procedure well without complications.  Standard post-procedure care is explained and return precautions are given.  Diona Fanti, CNM [facility], [facility] 01/21/19 4:43 PM

## 2019-01-21 NOTE — Patient Instructions (Addendum)
Postmenopausal Bleeding  Postmenopausal bleeding is any bleeding that a woman has after she has entered into menopause. Menopause is the end of a woman's fertile years. After menopause, a woman no longer ovulates and does not have menstrual periods. Postmenopausal bleeding may have various causes, including:  Menopausal hormone therapy (MHT).  Endometrial atrophy. After menopause, low estrogen hormone levels cause the membrane that lines the uterus (endometrium) to become thinner. You may have bleeding as the endometrium thins.  Endometrial hyperplasia. This condition is caused by excess estrogen hormones and low levels of progesterone hormones. The excess estrogen causes the endometrium to thicken, which can lead to bleeding. In some cases, this can lead to cancer of the uterus.  Endometrial cancer.  Non-cancerous growths (polyps) on the endometrium, the lining of the uterus, or the cervix.  Uterine fibroids. These are non-cancerous growths in or around the uterus muscle tissue that can cause heavy bleeding. Any type of postmenopausal bleeding, even if it appears to be a typical menstrual period, should be evaluated by your health care provider. Treatment will depend on the cause of the bleeding. Follow these instructions at home:  Pay attention to any changes in your symptoms.  Avoid using tampons and douches as told by your health care provider.  Change your pads regularly.  Get regular pelvic exams and Pap tests.  Take iron supplements as told by your health care provider.  Take over-the-counter and prescription medicines only as told by your health care provider.  Keep all follow-up visits as told by your health care provider. This is important. Contact a health care provider if:  Your bleeding lasts more than 1 week.  You have abdominal pain.  You have bleeding with or after sexual intercourse.  You have bleeding that happens more often than every 3 weeks. Get help  right away if:  You have a fever, chills, headache, dizziness, muscle aches, and bleeding.  You have severe pain with bleeding.  You are passing blood clots.  You have heavy bleeding, need more than 1 pad an hour, and have never experienced this before.  You feel faint. Summary  Postmenopausal bleeding is any bleeding that a woman has after she has entered into menopause.  Postmenopausal bleeding may have various causes. Treatment will depend on the cause of the bleeding.  Any type of postmenopausal bleeding, even if it appears to be a typical menstrual period, should be evaluated by your health care provider.  Be sure to pay attention to any changes in your symptoms and keep all follow-up visits as told by your health care provider. This information is not intended to replace advice given to you by your health care provider. Make sure you discuss any questions you have with your health care provider. Document Released: 07/02/2005 Document Revised: 07/01/2017 Document Reviewed: 06/17/2016 Elsevier Patient Education  Portal.   Endometrial Biopsy, Care After This sheet gives you information about how to care for yourself after your procedure. Your health care provider may also give you more specific instructions. If you have problems or questions, contact your health care provider. What can I expect after the procedure? After the procedure, it is common to have:  Mild cramping.  A small amount of vaginal bleeding for a few days. This is normal. Follow these instructions at home:   Take over-the-counter and prescription medicines only as told by your health care provider.  Do not douche, use tampons, or have sexual intercourse until your health care provider approves.  Return to your normal activities as told by your health care provider. Ask your health care provider what activities are safe for you.  Follow instructions from your health care provider about any  activity restrictions, such as restrictions on strenuous exercise or heavy lifting. Contact a health care provider if:  You have heavy bleeding, or bleed for longer than 2 days after the procedure.  You have bad smelling discharge from your vagina.  You have a fever or chills.  You have a burning sensation when urinating or you have difficulty urinating.  You have severe pain in your lower abdomen. Get help right away if:  You have severe cramps in your stomach or back.  You pass large blood clots.  Your bleeding increases.  You become weak or light-headed, or you pass out. Summary  After the procedure, it is common to have mild cramping and a small amount of vaginal bleeding for a few days.  Do not douche, use tampons, or have sexual intercourse until your health care provider approves.  Return to your normal activities as told by your health care provider. Ask your health care provider what activities are safe for you. This information is not intended to replace advice given to you by your health care provider. Make sure you discuss any questions you have with your health care provider. Document Released: 01/12/2013 Document Revised: 03/06/2017 Document Reviewed: 04/09/2016 Elsevier Patient Education  2020 [employer].

## 2019-01-21 NOTE — Progress Notes (Signed)
Patient c/o postmenopausal bleeding that started 1 week ago, lasted 5 days, flow was "extremely heavy day 1-2 then it started to taper off after that", no pain or cramping.

## 2019-01-24 ENCOUNTER — Other Ambulatory Visit (HOSPITAL_COMMUNITY)
Admission: RE | Admit: 2019-01-24 | Discharge: 2019-01-24 | Disposition: A | Discharge status: Home / Self Care | Payer: No Typology Code available for payment source | Source: Ambulatory Visit | Attending: Certified Nurse Midwife | Admitting: Certified Nurse Midwife

## 2019-01-24 DIAGNOSIS — N950 Postmenopausal bleeding: Secondary | ICD-10-CM | POA: Insufficient documentation

## 2019-01-24 NOTE — Addendum Note (Signed)
Addended by: Garner Nash on: 01/24/2019 09:24 AM   Modules accepted: Orders

## 2019-01-25 LAB — SURGICAL PATHOLOGY

## 2019-01-26 ENCOUNTER — Encounter: Payer: Self-pay | Admitting: *Deleted

## 2019-01-26 ENCOUNTER — Ambulatory Visit: Payer: No Typology Code available for payment source | Admitting: Gastroenterology

## 2019-01-26 LAB — CERVICOVAGINAL ANCILLARY ONLY
Bacterial Vaginitis (gardnerella): NEGATIVE
Candida Glabrata: NEGATIVE
Candida Vaginitis: NEGATIVE
Chlamydia: NEGATIVE
Comment: NEGATIVE
Comment: NEGATIVE
Comment: NEGATIVE
Comment: NEGATIVE
Comment: NEGATIVE
Comment: NORMAL
Neisseria Gonorrhea: NEGATIVE
Trichomonas: NEGATIVE

## 2019-02-07 ENCOUNTER — Other Ambulatory Visit: Payer: Self-pay

## 2019-02-07 ENCOUNTER — Encounter: Payer: Self-pay | Admitting: Internal Medicine

## 2019-02-07 ENCOUNTER — Ambulatory Visit (INDEPENDENT_AMBULATORY_CARE_PROVIDER_SITE_OTHER): Payer: No Typology Code available for payment source | Admitting: Internal Medicine

## 2019-02-07 VITALS — BP 112/66 | HR 65 | Ht 61.0 in | Wt 156.0 lb

## 2019-02-07 DIAGNOSIS — Z853 Personal history of malignant neoplasm of breast: Secondary | ICD-10-CM | POA: Diagnosis not present

## 2019-02-07 DIAGNOSIS — Z0000 Encounter for general adult medical examination without abnormal findings: Secondary | ICD-10-CM

## 2019-02-07 NOTE — Patient Instructions (Signed)

## 2019-02-07 NOTE — Progress Notes (Signed)
Date:  02/07/2019   Name:  Natalie Gallagher   DOB:  December 29, 1970   MRN:  [id number]   Chief Complaint: Annual Exam (Has OBGYN.) Natalie Gallagher is a 48 y.o. female who presents today for her Complete Annual Exam. She feels well. She reports exercising at the gym regularly. She reports she is sleeping well.   Pap - 01/2019  Mammogram - scheduled 03/2019  HPI  Review of Systems  Constitutional: Negative for chills, fatigue and fever.  HENT: Negative for congestion, hearing loss, tinnitus, trouble swallowing and voice change.   Eyes: Negative for visual disturbance.  Respiratory: Negative for cough, chest tightness, shortness of breath and wheezing.   Cardiovascular: Negative for chest pain, palpitations and leg swelling.  Gastrointestinal: Negative for abdominal pain, constipation, diarrhea and vomiting.  Endocrine: Negative for polydipsia and polyuria.  Genitourinary: Negative for dysuria, frequency, genital sores, vaginal bleeding and vaginal discharge.       She has breast pain on the left at the side of the radiographic markers  Musculoskeletal: Negative for arthralgias, gait problem and joint swelling.  Skin: Negative for color change and rash.  Neurological: Negative for dizziness, tremors, light-headedness and headaches.  Hematological: Negative for adenopathy. Does not bruise/bleed easily.  Psychiatric/Behavioral: Negative for dysphoric mood and sleep disturbance. The patient is not nervous/anxious.     Patient Active Problem List   Diagnosis Date Noted  . Lymphedema of right upper extremity 09/29/2017  . History of breast cancer in female 07/20/2013  . BMI 29.0-29.9,adult 11/28/2011    Allergies  Allergen Reactions  . Tape     The patient showed a cutaneous reaction to Telfa applied beneath a Tegaderm dressing. Telfa appears to be the primary offensive material.    Past Surgical History:  Procedure Laterality Date  . BREAST BIOPSY Right 2014   Positive  . BREAST  LUMPECTOMY Right 2014  . BREAST SURGERY Right 06/21/2012   right wide excision with sentinal node biopsy/axilary dissection.  . COLONOSCOPY WITH PROPOFOL N/A 10/01/2016   Procedure: COLONOSCOPY WITH PROPOFOL;  Surgeon: Robert Bellow, MD;  Location: [facility] ENDOSCOPY;  Service: Endoscopy;  Laterality: N/A;  . STRABISMUS SURGERY      Social History   Tobacco Use  . Smoking status: Never Smoker  . Smokeless tobacco: Never Used  Substance Use Topics  . Alcohol use: No  . Drug use: No     Medication list has been reviewed and updated.  Current Meds  Medication Sig  . Multiple Vitamins-Calcium (VIACTIV MULTI-VITAMIN PO) Take 2 tablets by mouth daily.    PHQ 2/9 Scores 02/07/2019 12/15/2018 02/04/2018 01/08/2017  PHQ - 2 Score 0 0 0 0    BP Readings from Last 3 Encounters:  02/07/19 112/66  01/21/19 127/82  01/20/19 117/79    Physical Exam Vitals signs and nursing note reviewed.  Constitutional:      General: She is not in acute distress.    Appearance: She is well-developed.  HENT:     Head: Normocephalic and atraumatic.     Right Ear: Tympanic membrane and ear canal normal.     Left Ear: Tympanic membrane and ear canal normal.     Nose:     Right Sinus: No maxillary sinus tenderness.     Left Sinus: No maxillary sinus tenderness.  Eyes:     General: No scleral icterus.       Right eye: No discharge.        Left eye: No discharge.  Conjunctiva/sclera: Conjunctivae normal.  Neck:     Musculoskeletal: Normal range of motion. No erythema.     Thyroid: No thyromegaly.     Vascular: No carotid bruit.  Cardiovascular:     Rate and Rhythm: Normal rate and regular rhythm.     Pulses: Normal pulses.     Heart sounds: Normal heart sounds.  Pulmonary:     Effort: Pulmonary effort is normal. No respiratory distress.     Breath sounds: No wheezing.  Chest:     Comments: Breast exam deferred to Oncology Abdominal:     General: Bowel sounds are normal.     Palpations:  Abdomen is soft.     Tenderness: There is no abdominal tenderness.  Musculoskeletal: Normal range of motion.  Lymphadenopathy:     Cervical: No cervical adenopathy.  Skin:    General: Skin is warm and dry.     Findings: No rash.  Neurological:     Mental Status: She is alert and oriented to person, place, and time.     Cranial Nerves: No cranial nerve deficit.     Sensory: No sensory deficit.     Deep Tendon Reflexes: Reflexes are normal and symmetric.  Psychiatric:        Speech: Speech normal.        Behavior: Behavior normal.        Thought Content: Thought content normal.     Wt Readings from Last 3 Encounters:  02/07/19 156 lb (70.8 kg)  01/21/19 157 lb 9.6 oz (71.5 kg)  01/20/19 155 lb 6.4 oz (70.5 kg)    BP 112/66   Pulse 65   Ht 5\' 1"  (1.549 m)   Wt 156 lb (70.8 kg)   LMP 07/27/2012 (Exact Date)   SpO2 100%   BMI 29.48 kg/m   Assessment and Plan: 1. Annual physical exam Normal exam Labs ordered by GYN and Oncology Continue healthy diet and regular exercise Patient declines flu vaccine  2. History of breast cancer in female Followed by Oncology for breast exams and mammograms   Partially dictated using Dragon software. Any errors are unintentional.  Halina Maidens, MD [facility]  02/07/2019

## 2019-02-08 LAB — COMPREHENSIVE METABOLIC PANEL
ALT: 13 IU/L (ref 0–32)
AST: 16 IU/L (ref 0–40)
Albumin/Globulin Ratio: 1.8 (ref 1.2–2.2)
Albumin: 4.4 g/dL (ref 3.8–4.8)
Alkaline Phosphatase: 65 IU/L (ref 39–117)
BUN/Creatinine Ratio: 15 (ref 9–23)
BUN: 12 mg/dL (ref 6–24)
Bilirubin Total: 0.2 mg/dL (ref 0.0–1.2)
CO2: 22 mmol/L (ref 20–29)
Calcium: 8.4 mg/dL — ABNORMAL LOW (ref 8.7–10.2)
Chloride: 104 mmol/L (ref 96–106)
Creatinine, Ser: 0.82 mg/dL (ref 0.57–1.00)
GFR calc Af Amer: 97 mL/min/{1.73_m2} (ref >59)
GFR calc non Af Amer: 84 mL/min/{1.73_m2} (ref >59)
Globulin, Total: 2.4 g/dL (ref 1.5–4.5)
Glucose: 74 mg/dL (ref 65–99)
Potassium: 4.3 mmol/L (ref 3.5–5.2)
Sodium: 140 mmol/L (ref 134–144)
Total Protein: 6.8 g/dL (ref 6.0–8.5)

## 2019-02-08 LAB — CBC
Hematocrit: 39.4 % (ref 34.0–46.6)
Hemoglobin: 13.3 g/dL (ref 11.1–15.9)
MCH: 30.3 pg (ref 26.6–33.0)
MCHC: 33.8 g/dL (ref 31.5–35.7)
MCV: 90 fL (ref 79–97)
Platelets: 242 10*3/uL (ref 150–450)
RBC: 4.39 x10E6/uL (ref 3.77–5.28)
RDW: 13.3 % (ref 11.7–15.4)
WBC: 6 10*3/uL (ref 3.4–10.8)

## 2019-02-08 LAB — LIPID PANEL
Chol/HDL Ratio: 2.7 ratio (ref 0.0–4.4)
Cholesterol, Total: 187 mg/dL (ref 100–199)
HDL: 69 mg/dL (ref >39)
LDL Chol Calc (NIH): 104 mg/dL — ABNORMAL HIGH (ref 0–99)
Triglycerides: 74 mg/dL (ref 0–149)
VLDL Cholesterol Cal: 14 mg/dL (ref 5–40)

## 2019-02-08 LAB — CANCER ANTIGEN 27.29: CA 27.29: 23.2 U/mL (ref 0.0–38.6)

## 2019-02-08 LAB — TSH: TSH: 2.11 u[IU]/mL (ref 0.450–4.500)

## 2019-02-10 ENCOUNTER — Ambulatory Visit (INDEPENDENT_AMBULATORY_CARE_PROVIDER_SITE_OTHER): Payer: No Typology Code available for payment source | Admitting: Certified Nurse Midwife

## 2019-02-10 ENCOUNTER — Ambulatory Visit (INDEPENDENT_AMBULATORY_CARE_PROVIDER_SITE_OTHER): Payer: No Typology Code available for payment source

## 2019-02-10 ENCOUNTER — Encounter: Payer: Self-pay | Admitting: Certified Nurse Midwife

## 2019-02-10 ENCOUNTER — Other Ambulatory Visit: Payer: Self-pay

## 2019-02-10 VITALS — BP 136/88 | HR 65 | Ht 61.0 in | Wt 160.1 lb

## 2019-02-10 DIAGNOSIS — Z853 Personal history of malignant neoplasm of breast: Secondary | ICD-10-CM | POA: Diagnosis not present

## 2019-02-10 DIAGNOSIS — R9389 Abnormal findings on diagnostic imaging of other specified body structures: Secondary | ICD-10-CM | POA: Diagnosis not present

## 2019-02-10 DIAGNOSIS — D251 Intramural leiomyoma of uterus: Secondary | ICD-10-CM | POA: Diagnosis not present

## 2019-02-10 DIAGNOSIS — N950 Postmenopausal bleeding: Secondary | ICD-10-CM

## 2019-02-10 NOTE — Progress Notes (Signed)
Patient here to discuss ultrasound report.  

## 2019-02-10 NOTE — Patient Instructions (Addendum)
Endometrial Biopsy  Endometrial biopsy is a procedure in which a tissue sample is taken from inside the uterus. The sample is taken from the endometrium, which is the lining of the uterus. The tissue sample is then checked under a microscope to see if the tissue is normal or abnormal. This procedure helps to determine where you are in your menstrual cycle and how hormone levels are affecting the lining of the uterus. This procedure may also be used to evaluate uterine bleeding or to diagnose endometrial cancer, endometrial tuberculosis, polyps, or other inflammatory conditions. Tell a health care provider about:  Any allergies you have.  All medicines you are taking, including vitamins, herbs, eye drops, creams, and over-the-counter medicines.  Any problems you or family members have had with anesthetic medicines.  Any blood disorders you have.  Any surgeries you have had.  Any medical conditions you have.  Whether you are pregnant or may be pregnant. What are the risks? Generally, this is a safe procedure. However, problems may occur, including:  Bleeding.  Pelvic infection.  Puncture of the wall of the uterus with the biopsy device (rare). What happens before the procedure?  Keep a record of your menstrual cycles as told by your health care provider. You may need to schedule your procedure for a specific time in your cycle.  You may want to bring a sanitary pad to wear after the procedure.  Ask your health care provider about: ? Changing or stopping your regular medicines. This is especially important if you are taking diabetes medicines or blood thinners. ? Taking medicines such as aspirin and ibuprofen. These medicines can thin your blood. Do not take these medicines before your procedure if your health care provider instructs you not to.  Plan to have someone take you home from the hospital or clinic. What happens during the procedure?  To lower your risk of  infection: ? Your health care team will wash or sanitize their hands.  You will lie on an exam table with your feet and legs supported as in a pelvic exam.  Your health care provider will insert an instrument (speculum) into your vagina to see your cervix.  Your cervix will be cleansed with an antiseptic solution.  A medicine (local anesthetic) will be used to numb the cervix.  A forceps instrument (tenaculum) will be used to hold your cervix steady for the biopsy.  A thin, rod-like instrument (uterine sound) will be inserted through your cervix to determine the length of your uterus and the location where the biopsy sample will be removed.  A thin, flexible tube (catheter) will be inserted through your cervix and into the uterus. The catheter will be used to collect the biopsy sample from your endometrial tissue.  The catheter and speculum will then be removed, and the tissue sample will be sent to a lab for examination. What happens after the procedure?  You will rest in a recovery area until you are ready to go home.  You may have mild cramping and a small amount of vaginal bleeding. This is normal.  It is up to you to get the results of your procedure. Ask your health care provider, or the department that is doing the procedure, when your results will be ready. Summary  Endometrial biopsy is a procedure in which a tissue sample is taken from the endometrium, which is the lining of the uterus.  This procedure may help to diagnose menstrual cycle problems, abnormal bleeding, or other conditions affecting   the endometrium.  Before the procedure, keep a record of your menstrual cycles as told by your health care provider.  The tissue sample that is removed will be checked under a microscope to see if it is normal or abnormal. This information is not intended to replace advice given to you by your health care provider. Make sure you discuss any questions you have with your health care  provider. Document Released: 07/25/2004 Document Revised: 03/06/2017 Document Reviewed: 04/09/2016 Elsevier Patient Education  2020 [employer] Uterine Fibroids  Uterine fibroids (leiomyomas) are noncancerous (benign) tumors that can develop in the uterus. Fibroids may also develop in the fallopian tubes, cervix, or tissues (ligaments) near the uterus. You may have one or many fibroids. Fibroids vary in size, weight, and where they grow in the uterus. Some can become quite large. Most fibroids do not require medical treatment. What are the causes? The cause of this condition is not known. What increases the risk? You are more likely to develop this condition if you:  Are in your 30s or 40s and have not gone through menopause.  Have a family history of this condition.  Are of African-American descent.  Had your first period at an early age (early menarche).  Have not had any children (nulliparity).  Are overweight or obese. What are the signs or symptoms? Many women do not have any symptoms. Symptoms of this condition may include:  Heavy menstrual bleeding.  Bleeding or spotting between periods.  Pain and pressure in the pelvic area, between the hips.  Bladder problems, such as needing to urinate urgently or more often than usual.  Inability to have children (infertility).  Failure to carry pregnancy to term (miscarriage). How is this diagnosed? This condition may be diagnosed based on:  Your symptoms and medical history.  A physical exam.  A pelvic exam that includes feeling for any tumors.  Imaging tests, such as ultrasound or MRI. How is this treated? Treatment for this condition may include:  Seeing your health care provider for follow-up visits to monitor your fibroids for any changes.  Taking NSAIDs such as ibuprofen, naproxen, or aspirin to reduce pain.  Hormone medicines. These may be taken as a pill, given in an injection, or delivered by a T-shaped  device that is inserted into the uterus (intrauterine device, IUD).  Surgery to remove one of the following: ? The fibroids (myomectomy). Your health care provider may recommend this if fibroids affect your fertility and you want to become pregnant. ? The uterus (hysterectomy). ? Blood supply to the fibroids (uterine artery embolization). Follow these instructions at home:  Take over-the-counter and prescription medicines only as told by your health care provider.  Ask your health care provider if you should take iron pills or eat more iron-rich foods, such as dark green, leafy vegetables. Heavy menstrual bleeding can cause low iron levels.  If directed, apply heat to your back or abdomen to reduce pain. Use the heat source that your health care provider recommends, such as a moist heat pack or a heating pad. ? Place a towel between your skin and the heat source. ? Leave the heat on for 20-30 minutes. ? Remove the heat if your skin turns bright red. This is especially important if you are unable to feel pain, heat, or cold. You may have a greater risk of getting burned.  Pay close attention to your menstrual cycle. Tell your health care provider about any changes, such as: ? Increased blood flow that   requires you to use more pads or tampons than usual. ? A change in the number of days that your period lasts. ? A change in symptoms that are associated with your period, such as back pain or cramps in your abdomen.  Keep all follow-up visits as told by your health care provider. This is important, especially if your fibroids need to be monitored for any changes. Contact a health care provider if you:  Have pelvic pain, back pain, or cramps in your abdomen that do not get better with medicine or heat.  Develop new bleeding between periods.  Have increased bleeding during or between periods.  Feel unusually tired or weak.  Feel light-headed. Get help right away if you:  Faint.  Have  pelvic pain that suddenly gets worse.  Have severe vaginal bleeding that soaks a tampon or pad in 30 minutes or less. Summary  Uterine fibroids are noncancerous (benign) tumors that can develop in the uterus.  The exact cause of this condition is not known.  Most fibroids do not require medical treatment unless they affect your ability to have children (fertility).  Contact a health care provider if you have pelvic pain, back pain, or cramps in your abdomen that do not get better with medicines.  Make sure you know what symptoms should cause you to get help right away. This information is not intended to replace advice given to you by your health care provider. Make sure you discuss any questions you have with your health care provider. Document Released: 03/21/2000 Document Revised: 03/06/2017 Document Reviewed: 02/17/2017 Elsevier Patient Education  2020 [employer]  

## 2019-02-10 NOTE — Progress Notes (Signed)
GYN ENCOUNTER NOTE  Subjective:       Natalie Gallagher is a 48 y.o. G0P0 female here for results review.   Last seen in office on  01/21/2019 for evaluation of post menopausal bleeding.   Denies difficulty breathing or respiratory distress, chest pain, abdominal pain, vaginal bleeding, dysuria, and leg pain or swelling.   History significant for breast cancer, previously treated with Tamoxifen-last dose 04/2018.   Gynecologic History  Patient's last menstrual period was 07/27/2012 (exact date).  Contraception: post menopausal status  Last Pap: 01/2017. Results were: Negative/Negative  Last mammogram: MRI 08/2018. Results were: BI-RADS 4  Obstetric History  OB History  Gravida Para Term Preterm AB Living  0            SAB TAB Ectopic Multiple Live Births             Obstetric Comments  Age first period 63    Past Medical History:  Diagnosis Date  . Breast cancer ([place]) 2014   Chemo/radiaiton- Rt.  . Breast cancer of upper-outer quadrant of right female breast [facility]) March 2014   T1c,N0,M0; BRCA neg. ER: 60%; PR: 70%, her 2 neu not over expressed.   . Cancer [facility]) 2014   right breast.right wide excision with sentinal node biopsy on 06/21/12  . Lymphedema 2015   Right Arm.  . Personal history of chemotherapy   . Personal history of radiation therapy     Past Surgical History:  Procedure Laterality Date  . BREAST BIOPSY Right 2014   Positive  . BREAST LUMPECTOMY Right 2014  . BREAST SURGERY Right 06/21/2012   right wide excision with sentinal node biopsy/axilary dissection.  . COLONOSCOPY WITH PROPOFOL N/A 10/01/2016   Procedure: COLONOSCOPY WITH PROPOFOL;  Surgeon: Robert Bellow, MD;  Location: [facility] ENDOSCOPY;  Service: Endoscopy;  Laterality: N/A;  . STRABISMUS SURGERY      Current Outpatient Medications on File Prior to Visit  Medication Sig Dispense Refill  . Multiple Vitamins-Calcium (VIACTIV MULTI-VITAMIN PO) Take 2 tablets by mouth daily.     No current  facility-administered medications on file prior to visit.     Allergies  Allergen Reactions  . Tape     The patient showed a cutaneous reaction to Telfa applied beneath a Tegaderm dressing. Telfa appears to be the primary offensive material.    Social History   Socioeconomic History  . Marital status: Divorced    Spouse name: Not on file  . Number of children: 0  . Years of education: Not on file  . Highest education level: Not on file  Occupational History  . Not on file  Social Needs  . Financial resource strain: Not on file  . Food insecurity    Worry: Not on file    Inability: Not on file  . Transportation needs    Medical: Not on file    Non-medical: Not on file  Tobacco Use  . Smoking status: Never Smoker  . Smokeless tobacco: Never Used  Substance and Sexual Activity  . Alcohol use: No  . Drug use: No  . Sexual activity: Not Currently    Birth control/protection: None  Lifestyle  . Physical activity    Days per week: Not on file    Minutes per session: Not on file  . Stress: Not on file  Relationships  . Social Herbalist on phone: Not on file    Gets together: Not on file    Attends religious service:  Not on file    Active member of club or organization: Not on file    Attends meetings of clubs or organizations: Not on file    Relationship status: Not on file  . Intimate partner violence    Fear of current or ex partner: Not on file    Emotionally abused: Not on file    Physically abused: Not on file    Forced sexual activity: Not on file  Other Topics Concern  . Not on file  Social History Narrative  . Not on file    Family History  Problem Relation Age of Onset  . Cancer Paternal Uncle        Lung  . Cancer Cousin        Breast   . Cancer Maternal Aunt        lung  . Diabetes Mother   . Hypertension Father   . Diabetes Father   . Breast cancer Neg Hx   . Ovarian cancer Neg Hx   . Colon cancer Neg Hx     The following  portions of the patient's history were reviewed and updated as appropriate: allergies, current medications, past family history, past medical history, past social history, past surgical history and problem list.  Review of Systems  ROS negative except as noted above. Information obtained from patient.   Objective:   BP 136/88   Pulse 65   Ht _0  (1.549 m)   Wt 160 lb 1.6 oz (72.6 kg)   LMP 07/27/2012 (Exact Date)   BMI 30.25 kg/m   CONSTITUTIONAL: Well-developed, well-nourished female in no acute distress.   PHYSICAL EXAM: Not indicated.   Recent Results (from the past 2160 hour(s))  Cervicovaginal ancillary only     Status: None   Collection Time: 01/21/19 10:50 AM  Result Value Ref Range   Neisseria Gonorrhea Negative    Chlamydia Negative    Trichomonas Negative    Bacterial Vaginitis (gardnerella) Negative    Candida Vaginitis Negative    Candida Glabrata Negative    Comment      Normal Reference Range Bacterial Vaginosis - Negative   Comment Normal Reference Range Candida Species - Negative    Comment Normal Reference Range Candida Galbrata - Negative    Comment Normal Reference Range Trichomonas - Negative    Comment Normal Reference Ranger Chlamydia - Negative    Comment      Normal Reference Range Neisseria Gonorrhea - Negative  Surgical pathology     Status: None   Collection Time: 01/24/19  9:24 AM  Result Value Ref Range   SURGICAL PATHOLOGY      SURGICAL PATHOLOGY CASE: [id number] PATIENT: [place] [facility] Surgical Pathology Report     Clinical History: (cm)     FINAL MICROSCOPIC DIAGNOSIS:  A. ENDOMETRIUM, BIOPSY: - Endometrioid type polyp(s). - There is no evidence of hyperplasia or malignancy.   GROSS DESCRIPTION:  Received in formalin is a 1.4 x 1 x 0.3 cm aggregate of dark red soft tissue and mucoid material.  Submitted 1 block.  (SSW 10/19)   Final Diagnosis performed by Enid Cutter, MD.   Electronically  signed 01/25/2019 Technical component performed at [facility], [place] [address], [place], [place] [address].  Professional component performed at [facility], [place] [address], [place], [place] [address].  Immunohistochemistry Technical component (if applicable) was performed at [facility]. [address] [place], [place], [place] [address].   IMMUNOHISTOCHEMISTRY DISCLAIMER (if  applicable): Some of these immunohistochemical stains m ay have been developed and the performance characteristics determine by [facility] Some may not have been cleared or approved by the U.S. Food and Drug Administration. The FDA has determined that such clearance or approval is not necessary. This test is used for clinical purposes. It should not be regarded as investigational or for research. This laboratory is certified under the [place] (CLIA-88) as qualified to perform high complexity clinical laboratory testing.  The controls stained appropriately.   CBC     Status: None   Collection Time: 02/07/19  9:07 AM  Result Value Ref Range   WBC 6.0 3.4 - 10.8 x10E3/uL   RBC 4.39 3.77 - 5.28 x10E6/uL   Hemoglobin 13.3 11.1 - 15.9 g/dL   Hematocrit 39.4 34.0 - 46.6 %   MCV 90 79 - 97 fL   MCH 30.3 26.6 - 33.0 pg   MCHC 33.8 31.5 - 35.7 g/dL   RDW 13.3 11.7 - 15.4 %   Platelets 242 150 - 450 x10E3/uL  Comprehensive metabolic panel     Status: Abnormal   Collection Time: 02/07/19  9:07 AM  Result Value Ref Range   Glucose 74 65 - 99 mg/dL   BUN 12 6 - 24 mg/dL   Creatinine, Ser 0.82 0.57 - 1.00 mg/dL   GFR calc non Af Amer 84 >59 mL/min/1.73   GFR calc Af Amer 97 >59 mL/min/1.73   BUN/Creatinine Ratio 15 9 - 23   Sodium 140 134 - 144 mmol/L   Potassium 4.3 3.5 - 5.2 mmol/L   Chloride 104 96 - 106 mmol/L   CO2 22 20 - 29 mmol/L   Calcium 8.4 (L) 8.7 - 10.2 mg/dL   Total Protein 6.8 6.0 -  8.5 g/dL   Albumin 4.4 3.8 - 4.8 g/dL   Globulin, Total 2.4 1.5 - 4.5 g/dL   Albumin/Globulin Ratio 1.8 1.2 - 2.2   Bilirubin Total 0.2 0.0 - 1.2 mg/dL   Alkaline Phosphatase 65 39 - 117 IU/L   AST 16 0 - 40 IU/L   ALT 13 0 - 32 IU/L  Lipid panel     Status: Abnormal   Collection Time: 02/07/19  9:07 AM  Result Value Ref Range   Cholesterol, Total 187 100 - 199 mg/dL   Triglycerides 74 0 - 149 mg/dL   HDL 69 >39 mg/dL   VLDL Cholesterol Cal 14 5 - 40 mg/dL   LDL Chol Calc (NIH) 104 (H) 0 - 99 mg/dL   Chol/HDL Ratio 2.7 0.0 - 4.4 ratio    Comment:                                   T. Chol/HDL Ratio                                             Men  Women                               1/2 Avg.Risk  3.4    3.3  Avg.Risk  5.0    4.4                                2X Avg.Risk  9.6    7.1                                3X Avg.Risk 23.4   11.0   TSH     Status: None   Collection Time: 02/07/19  9:07 AM  Result Value Ref Range   TSH 2.110 0.450 - 4.500 uIU/mL    ULTRASOUND REPORT  Location: [facility]  Date of Service: 02/10/2019   TECHNIQUE: Both transabdominal and transvaginal ultrasound examinations of the pelvis were performed. Transabdominal technique was performed for global imaging of the pelvis including uterus, ovaries, adnexal regions, and pelvic cul-de-sac. It was necessary to proceed with endovaginal exam following the transabdominal exam to visualize the endometrium and adnexa.  Indications:AUB Findings:  The uterus is anteverted and measures 7.6 x 5.2 x 3.3 cm.. Echo texture is homogenous with evidence of focal masses. Within the uterus are multiple suspected fibroids measuring: Fibroid 1:Anterior Fundal IM 3.2 x 2.9 x 3.9 cm Fibroid 2:Pedunculated fundal 2.8 x 2.0 x 2.3 cm  The Endometrium measures 13 mm.  Right Ovary measures 4.3 x 3.1 x 3.1  cm. It is normal in appearance. Left Ovary measures 1.8 x 1.5 x 1.4 cm. It is  normal in appearance. Survey of the adnexa demonstrates no adnexal masses. There is no free fluid in the cul de sac.  Impression: 1. Enlarge uterus with multiple fibroids as described above. 2. Endometrium is upper limites at 13 mm.  Recommendations: 1.Clinical correlation with the patient's History and Physical Exam.   Assessment:   1. Intramural leiomyoma of uterus   2. Postmenopausal bleeding    3. Endometrial thickening on ultrasound  Plan:   Labs results and ultrasound findings discussed with patient, verbalized understanding. ACOG handouts and additional teaching provided in AVS.   Recommend repeat ultrasound and endometrial biopsy in six (6) months after consultation with Dr. Marcelline Mates; patient agrees to plan of care.   Reviewed red flag symptoms and when to call.   RTC x 6 months for repeat ultrasound and endometrial biopsy or sooner if needed.    Diona Fanti, CNM [facility], [facility]

## 2019-02-12 DIAGNOSIS — D251 Intramural leiomyoma of uterus: Secondary | ICD-10-CM | POA: Insufficient documentation

## 2019-02-12 DIAGNOSIS — R9389 Abnormal findings on diagnostic imaging of other specified body structures: Secondary | ICD-10-CM | POA: Insufficient documentation

## 2019-02-14 ENCOUNTER — Encounter: Payer: Self-pay | Admitting: Internal Medicine

## 2019-02-22 ENCOUNTER — Other Ambulatory Visit: Payer: Self-pay

## 2019-02-22 DIAGNOSIS — Z853 Personal history of malignant neoplasm of breast: Secondary | ICD-10-CM

## 2019-02-22 DIAGNOSIS — I890 Lymphedema, not elsewhere classified: Secondary | ICD-10-CM

## 2019-02-28 ENCOUNTER — Inpatient Hospital Stay: Payer: No Typology Code available for payment source | Admitting: Hematology and Oncology

## 2019-02-28 ENCOUNTER — Inpatient Hospital Stay: Payer: No Typology Code available for payment source

## 2019-03-01 ENCOUNTER — Other Ambulatory Visit: Payer: Self-pay | Admitting: Hematology and Oncology

## 2019-03-08 ENCOUNTER — Other Ambulatory Visit: Payer: No Typology Code available for payment source

## 2019-03-08 HISTORY — PX: BREAST BIOPSY: SHX20

## 2019-03-09 ENCOUNTER — Telehealth: Payer: Self-pay

## 2019-03-09 ENCOUNTER — Ambulatory Visit: Payer: No Typology Code available for payment source

## 2019-03-09 ENCOUNTER — Other Ambulatory Visit: Payer: Self-pay

## 2019-03-09 DIAGNOSIS — R110 Nausea: Secondary | ICD-10-CM

## 2019-03-09 MED ORDER — ONDANSETRON HCL 4 MG PO TABS: 4.0000 mg | ORAL_TABLET | Freq: Three times a day (TID) | ORAL | 0 refills | Status: DC | PRN

## 2019-03-09 NOTE — Telephone Encounter (Signed)
Spoke with the patient to see what medication she needed before getting the MRI done. She states she will need Zofran 4 mg due to the dye making her sick to her stomach when she have the MRI.I have informed Dr C of the medication the patient has requested.

## 2019-03-09 NOTE — Telephone Encounter (Signed)
The patient has been prescribe Zofran 4 mg Q 8 hrs prn # 10 with no refills for her MRI.The patient was made aware.

## 2019-03-09 NOTE — Progress Notes (Incomplete)
[facility]  [address], [address] [place], [place] [address] Phone: [phone]  Fax: [phone]   Telemedicine Office Visit:  03/09/2019  Referring physician: Glean Hess, MD  I connected with [name] on 03/09/2019 at 10:11 AM by videoconferencing and verified that I was speaking with the correct person using 2 identifiers.  The patient was at *** home.  I discussed the limitations, risk, security and privacy concerns of performing an evaluation and management service by videoconferencing and the availability of in person appointments.  I also discussed with the patient that there may be a patient responsible charge related to this service.  The patient expressed understanding and agreed to proceed.  Chief Complaint: Natalie Gallagher is a 48 y.o. female with stage IA right breast cancer who is seen for 4 monthassessment and review of breast MRI.  HPI: The patient was last seen in the medical oncology clinic on 11/15/2018. At that time, she was doing well.  Vasomotor symptoms have resolved.  She had lymphedema and was using a compression sleeve and going to the lymphedema clinic. Breast MRI was ordered for 02/21/2019. She continued on multivitamins.   Patient was seen in the ED by Dr. Lacinda Axon on 12/03/2018 for abdominal pain at Urgent [facility] in [place]. Patient had diverticulitis and was treated with 7 days of cipro and flagly.   Patient had an ED follow up with Dr. Army Melia on 12/15/2018. Patient was doing well. Her abdominal pain had resolved. She completed a course of Flagyl and Cipro 4 days ago. She denied any fevers. Her bowel was normal. Patient was referred to Dr. Allen Norris.   She had an initial consultation with Dr. Allen Norris on 01/20/2019. There was no report of any black stools or bloody stools.  She denied any constipation but did state that she had mushy stools but that had been chronic.  There was no report of any change in bowel habits. The patient was  asymptomatic. The patient had also been told that since it had only happened 1 time and may not happen again Dr. Allen Norris recommended no further work-up unless she has another attack of diverticulitis.  She was seen by Verdene Rio, CNM in obstetrics on 01/21/2019 for postmenopausal bleeding. Her bleeding lasted for 5 days. She had extremely heavy bleeding x 1-2 days and then it tapered off to nothing. She had no pain or cramping. A vaginal swab was done. Transvaginal pelvis ultrasound was ordered.   She had a follow up with [place], CNM on 02/10/2019 for postmenopuasal bleeding. Patient denied difficulty breathing or respiratory distress, chest pain, abdominal pain, vaginal bleeding, dysuria, and leg pain or swelling. Transvaginal pelvis ultrasound on 02/10/2019 revealed a enlarged uterus with multiple fibroids. Fibroids 1; Anterior Fundal IM 3.2 x 2.9 x 3.9 cm. Fibroid 2; Pedunculated fundal 2.8 x 2.0 x 2.3 cm. The endometrium was upper limites at 13 mm. Patient was recommended a repeat ultrasound and endometrial biopsy in six (6) months after consultation with Dr. Marcelline Mates; patient agreed to plan of care.  During the interim, the patient ***   Past Medical History:  Diagnosis Date   Breast cancer ([place]) 2014   Chemo/radiaiton- Rt.   Breast cancer of upper-outer quadrant of right female breast [facility]) March 2014   T1c,N0,M0; BRCA neg. ER: 60%; PR: 70%, her 2 neu not over expressed.    Cancer [facility]) 2014   right breast.right wide excision with sentinal node biopsy on 06/21/12   Lymphedema 2015   Right Arm.  Personal history of chemotherapy    Personal history of radiation therapy     Past Surgical History:  Procedure Laterality Date   BREAST BIOPSY Right 2014   Positive   BREAST LUMPECTOMY Right 2014   BREAST SURGERY Right 06/21/2012   right wide excision with sentinal node biopsy/axilary dissection.   COLONOSCOPY WITH PROPOFOL N/A 10/01/2016   Procedure: COLONOSCOPY WITH PROPOFOL;  Surgeon:  Robert Bellow, MD;  Location: [facility] ENDOSCOPY;  Service: Endoscopy;  Laterality: N/A;   STRABISMUS SURGERY      Family History  Problem Relation Age of Onset   Cancer Paternal Uncle        Lung   Cancer Cousin        Breast    Cancer Maternal Aunt        lung   Diabetes Mother    Hypertension Father    Diabetes Father    Breast cancer Neg Hx    Ovarian cancer Neg Hx    Colon cancer Neg Hx     Social History:  reports that she has never smoked. She has never used smokeless tobacco. She reports that she does not drink alcohol or use drugs. She is going to the gym.She lives in [place]. She is losing weight on Weight [employer]chers.She works at the [employer]. 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 She lives in [place]. The patient is ***alone/accompanied by *** today.  Participants in the patient's visit and their role in the encounter included the patient, ***, and Waymon Budge, RN or Vito Berger, CMA, today.  The intake visit was provided by *** Waymon Budge, RN or Vito Berger, [place].  Allergies:  Allergies  Allergen Reactions   Tape     The patient showed a cutaneous reaction to Telfa applied beneath a Tegaderm dressing. Telfa appears to be the primary offensive material.    Current Medications: Current Outpatient Medications  Medication Sig Dispense Refill   Multiple Vitamins-Calcium (VIACTIV MULTI-VITAMIN PO) Take 2 tablets by mouth daily.     No current facility-administered medications for this visit.      Review of Systems  Constitutional: Negative.  Negative for chills, diaphoresis (hot flashes, resolved), fever, malaise/fatigue and weight loss (stable).       Doing "well."  HENT: Negative.  Negative for congestion, ear pain, hearing loss, nosebleeds, sinus pain and sore throat.   Eyes: Negative.  Negative for blurred vision, double vision, photophobia and pain.    Respiratory: Negative.  Negative for cough, hemoptysis, sputum production and shortness of breath.   Cardiovascular: Negative.  Negative for chest pain, palpitations, orthopnea, leg swelling and PND.  Gastrointestinal: Negative.  Negative for abdominal pain, blood in stool, constipation, diarrhea, heartburn, melena, nausea (with gadolinium for MRI) and vomiting.  Genitourinary: Negative.  Negative for dysuria, frequency, hematuria and urgency.  Musculoskeletal: Negative.  Negative for back pain, joint pain, myalgias and neck pain.       Lymphedema of right arm  Skin: Negative.  Negative for itching and rash.  Neurological: Negative.  Negative for dizziness, tremors, sensory change, speech change, focal weakness, weakness and headaches.  Endo/Heme/Allergies: Does not bruise/bleed easily.       Vasomotor symptoms- resolved.  Post-menopausal.   Psychiatric/Behavioral: Negative.  Negative for depression and memory loss. The patient is not nervous/anxious and does not have insomnia.   All other systems reviewed and are negative.    Performance status (ECOG): 0  Physical Exam  Constitutional: She is oriented  to person, place, and time. She appears well-developed and well-nourished. No distress.  HENT:  Head: Normocephalic and atraumatic.  Curly black hair. Wearing a mask.  Eyes: Conjunctivae and EOM are normal. No scleral icterus.  Glasses.  Brown eyes.  Pulmonary/Chest: Right breast exhibits no inverted nipple, no mass, no nipple discharge, no skin change (post radiation edema) and no tenderness. Left breast exhibits no inverted nipple, no mass (inferior fibrocystic changes), no nipple discharge, no skin change and no tenderness.  Musculoskeletal:     Comments: Lymphedema, right arm  Neurological: She is alert and oriented to person, place, and time.  Skin: She is not diaphoretic.  Psychiatric: She has a normal mood and affect. Her behavior is normal. Judgment and thought content normal.   Nursing note reviewed.   No visits with results within 3 Day(s) from this visit.  Latest known visit with results is:  Office Visit on 01/21/2019  Component Date Value Ref Range Status   WBC 02/07/2019 6.0  3.4 - 10.8 x10E3/uL Final   RBC 02/07/2019 4.39  3.77 - 5.28 x10E6/uL Final   Hemoglobin 02/07/2019 13.3  11.1 - 15.9 g/dL Final   Hematocrit 02/07/2019 39.4  34.0 - 46.6 % Final   MCV 02/07/2019 90  79 - 97 fL Final   MCH 02/07/2019 30.3  26.6 - 33.0 pg Final   MCHC 02/07/2019 33.8  31.5 - 35.7 g/dL Final   RDW 02/07/2019 13.3  11.7 - 15.4 % Final   Platelets 02/07/2019 242  150 - 450 x10E3/uL Final   Glucose 02/07/2019 74  65 - 99 mg/dL Final   BUN 02/07/2019 12  6 - 24 mg/dL Final   Creatinine, Ser 02/07/2019 0.82  0.57 - 1.00 mg/dL Final   GFR calc non Af Amer 02/07/2019 84  >59 mL/min/1.73 Final   GFR calc Af Amer 02/07/2019 97  >59 mL/min/1.73 Final   BUN/Creatinine Ratio 02/07/2019 15  9 - 23 Final   Sodium 02/07/2019 140  134 - 144 mmol/L Final   Potassium 02/07/2019 4.3  3.5 - 5.2 mmol/L Final   Chloride 02/07/2019 104  96 - 106 mmol/L Final   CO2 02/07/2019 22  20 - 29 mmol/L Final   Calcium 02/07/2019 8.4* 8.7 - 10.2 mg/dL Final   Total Protein 02/07/2019 6.8  6.0 - 8.5 g/dL Final   Albumin 02/07/2019 4.4  3.8 - 4.8 g/dL Final   Globulin, Total 02/07/2019 2.4  1.5 - 4.5 g/dL Final   Albumin/Globulin Ratio 02/07/2019 1.8  1.2 - 2.2 Final   Bilirubin Total 02/07/2019 0.2  0.0 - 1.2 mg/dL Final   Alkaline Phosphatase 02/07/2019 65  39 - 117 IU/L Final   AST 02/07/2019 16  0 - 40 IU/L Final   ALT 02/07/2019 13  0 - 32 IU/L Final   Cholesterol, Total 02/07/2019 187  100 - 199 mg/dL Final   Triglycerides 02/07/2019 74  0 - 149 mg/dL Final   HDL 02/07/2019 69  >39 mg/dL Final   VLDL Cholesterol Cal 02/07/2019 14  5 - 40 mg/dL Final   LDL Chol Calc (NIH) 02/07/2019 104* 0 - 99 mg/dL Final   Chol/HDL Ratio 02/07/2019 2.7  0.0 - 4.4  ratio Final   Comment:                                   T. Chol/HDL Ratio  Men  Women                               1/2 Avg.Risk  3.4    3.3                                   Avg.Risk  5.0    4.4                                2X Avg.Risk  9.6    7.1                                3X Avg.Risk 23.4   11.0    TSH 02/07/2019 2.110  0.450 - 4.500 uIU/mL Final   Neisseria Gonorrhea 01/21/2019 Negative   Final   Chlamydia 01/21/2019 Negative   Final   Trichomonas 01/21/2019 Negative   Final   Bacterial Vaginitis (gardnerella) 01/21/2019 Negative   Final   Candida Vaginitis 01/21/2019 Negative   Final   Candida Glabrata 01/21/2019 Negative   Final   Comment 01/21/2019 Normal Reference Range Bacterial Vaginosis - Negative   Final   Comment 01/21/2019 Normal Reference Range Candida Species - Negative   Final   Comment 01/21/2019 Normal Reference Range Candida Galbrata - Negative   Final   Comment 01/21/2019 Normal Reference Range Trichomonas - Negative   Final   Comment 01/21/2019 Normal Reference [place] Chlamydia - Negative   Final   Comment 01/21/2019 Normal Reference Range Neisseria Gonorrhea - Negative   Final   SURGICAL PATHOLOGY 01/24/2019    Final-Edited                   Value:SURGICAL PATHOLOGY CASE: [id number] PATIENT: [facility] Surgical Pathology Report     Clinical History: (cm)     FINAL MICROSCOPIC DIAGNOSIS:  A. ENDOMETRIUM, BIOPSY: - Endometrioid type polyp(s). - There is no evidence of hyperplasia or malignancy.   GROSS DESCRIPTION:  Received in formalin is a 1.4 x 1 x 0.3 cm aggregate of dark red soft tissue and mucoid material.  Submitted 1 block.  (SSW 10/19)   Final Diagnosis performed by Enid Cutter, MD.   Electronically signed 01/25/2019 Technical component performed at [facility]. [facility], [place] [address], [place], [place] [address].  Professional component performed at [facility], [place] [address] [place], [place] [address].  Immunohistochemistry Technical component (if applicable) was performed at [facility]. [address] [place], [place], [place] [address].   IMMUNOHISTOCHEMISTRY DISCLAIMER (if applicable): Some of these immunohistochemical stains m                         ay have been developed and the performance characteristics determine by [facility]. Some may not have been cleared or approved by the U.S. Food and Drug Administration. The FDA has determined that such clearance or approval is not necessary. This test is used for clinical purposes. It should not be regarded as investigational or for research. This laboratory is certified under the [place] (CLIA-88) as qualified to perform high complexity clinical laboratory testing.  The controls stained appropriately.    CA 27.29 02/07/2019 23.2  0.0 - 38.6 U/mL Final   Comment: Siemens Centaur Immunochemiluminometric Methodology [facility]) Values obtained with different assay methods or kits cannot be used interchangeably. Results cannot be interpreted as absolute evidence of the presence or absence of malignant disease.     Assessment:  Zanyah Lentsch is a 48 y.o. female  with stage IA right breast invasive mammary carcinomastatus post wide excision and axillary lymph node dissection on 06/21/2012. Pathology revealed a 1.7 cm grade III ductal carcinoma. Thirteen lymph nodes were negative. Tumor was ER/PR positive and HER-2/neu negative. BRCA 1 and 2 testing was negative. Oncotype DXwas 29 (high) which translates into a distant recurrence of 19% at 10 yrs with tamoxifen alone.  The patient received 4 cycles ofAdriamycin and Cytoxan(08/17/2012 - 10/19/2012) followed by 12 weeks of Taxol(11/19/2012 - 01/25/2013). She completed local radiationon 04/20/2013. Shewas onTamoxifenfrom01/15/2015 -  04/21/2018.   CA27.29has been followed: 17 on 01/12/2015, 18.6 on 07/27/2015, 14.9 on 01/28/2016, 13.9 on 08/21/2016, 15.1 on 02/03/2017, 17.8 on 08/17/2017, 13.3 on 02/04/2018, and 11.2 on 08/12/2018.  Bilateral mammogramon 08/11/2016 revealed no evidence of malignancy.Bilateral breast MRIon 04/09/2017 demonstrated no evidence of malignancy. Bilateral mammogramon 08/13/2017 revealing no mammographic evidence of malignancy in either breast. There were stable post lumpectomy changes noted in the RIGHT breast, including a far posterior seroma.   Bilateral screening mammogram on 08/18/2018 revealed no evidence of malignancy. Bilateral breast MRI on 08/19/2018 revealed an indeterminate irregular enhancing mass in the upper inner left breast and indeterminate clumped non mass enhancement within the anteromedial left breast.  Left breast biopsy on 09/06/2018 revealed fat necrosis with no evidence of malignancy in both locations.   Hormone levelson 08/21/2017 revealed an estradiol level of 22.8 pg/mL and an FSH of 34.9 mIU/mL, both of which are consistent with a post-menopausal state.   Breast cancer index (BCI)on 09/07/2017 demonstrated a a high risk of recurrence at 8.3% (3.3% - 13.1%), but a low likelihood of benefit from extended endocrine therapy.  Symptomatically, ***  Plan: 1.   Labs today: CBC with diff, CMP. Review breast MRI. 2.   Stage IA right breastcancer              Clinically, she is doing well.              Exam reveals no evidence of recurrent disease.             Review interval mammogram and breast MRI.                           Breast MRI revealed indeterminate irregular enhancing mass in the upper inner quadrant.                         Left breast biopsy revealed fat necrosis with no malignancy             Discuss plans for follow-up breast MRI in 6 months.             She completed 5 years of endocrine therapy.             She opted for no extended adjuvant  endocrine therapy.             Continue surveillance. 3.   Breast MRI on 02/21/2019 4.   RTC after breast MRI for MD assessment, labs (CBC with diff, CMP, CA27.29), and review of breast MRI.   I discussed the assessment and treatment plan with the  patient.  The patient was provided an opportunity to ask questions and all were answered.  The patient agreed with the plan and demonstrated an understanding of the instructions.  The patient was advised to call back or seek an in person evaluation if the symptoms worsen or if the condition fails to improve as anticipated.  I provided *** minutes (10:11 AM - X:XX) of face-to-face video visit time during this this encounter and > 50% was spent counseling as documented under my assessment and plan.  I provided these services from the [facility] office ***.   Nolon Stalls, MD, PhD  03/09/2019, 10:11 AM  I, Selena Batten, am acting as scribe for [employer]. Mike Gip, MD, PhD.  {Add scribe attestation statement}

## 2019-03-09 NOTE — Telephone Encounter (Signed)
-----   Message from Lequita Asal, MD sent at 03/09/2019  1:06 PM EST ----- Regarding: RE: MRI/ Nausea med/ follow up  Please find out what medication she is missing.  M ----- Message ----- From: Secundino Ginger Sent: 03/09/2019  10:56 AM EST To: Arlan Organ, RN, # Subject: MRI/ Nausea med/ follow up                     This patient called and cancelled her MRI today because she said she didn't get her nausea medication refilled. The pharmacy told her they did not here from [place] on her medication. Will one of you check this so I can reschedule

## 2019-03-10 ENCOUNTER — Inpatient Hospital Stay: Payer: No Typology Code available for payment source | Admitting: Hematology and Oncology

## 2019-03-24 ENCOUNTER — Other Ambulatory Visit: Payer: Self-pay

## 2019-03-24 ENCOUNTER — Ambulatory Visit
Admission: RE | Admit: 2019-03-24 | Discharge: 2019-03-24 | Disposition: A | Discharge status: Home / Self Care | Payer: No Typology Code available for payment source | Source: Ambulatory Visit | Attending: Hematology and Oncology | Admitting: Hematology and Oncology

## 2019-03-24 DIAGNOSIS — C50911 Malignant neoplasm of unspecified site of right female breast: Secondary | ICD-10-CM | POA: Insufficient documentation

## 2019-03-24 DIAGNOSIS — Z170 Estrogen receptor positive status [ER+]: Secondary | ICD-10-CM | POA: Diagnosis present

## 2019-03-24 DIAGNOSIS — R928 Other abnormal and inconclusive findings on diagnostic imaging of breast: Secondary | ICD-10-CM | POA: Diagnosis present

## 2019-03-24 MED ORDER — GADOBUTROL 1 MMOL/ML IV SOLN
7.0000 mL | Freq: Once | INTRAVENOUS | Status: AC | PRN
  Administered 2019-03-24: 7 mL via INTRAVENOUS

## 2019-03-27 NOTE — Progress Notes (Signed)
[facility]  [address], [address] [place], [place] [address] Phone: [phone]  Fax: [phone]   Telemedicine Office Visit:  03/29/2019  Referring physician: Glean Hess, MD  I connected with Natalie Gallagher on 03/29/2019 at 1:26 PM by videoconferencing and verified that I was speaking with the correct person using 2 identifiers.  The patient was at work.  I discussed the limitations, risk, security and privacy concerns of performing an evaluation and management service by videoconferencing and the availability of in person appointments.  I also discussed with the patient that there may be a patient responsible charge related to this service.  The patient expressed understanding and agreed to proceed.   Chief Complaint: Natalie Gallagher is a 48 y.o. female with stage IA right breast cancer who is seen for 4 month assessment and review of interval breast MRI.  HPI: The patient was last seen in the medical oncology clinic on 11/15/2018. At that time, she was doing well.  Vasomotor symptoms have resolved.  She had lymphedema and was using a compression sleeve.  Patient was seen by Dr. Halina Maidens on 12/15/2018. Her abdominal pain that lasted x 1-4 weeks had resolved. She had completed a course of Flagyl and ciprofloxacin for diverticulitis. Her bowels were normal. She had no complaints. Dr. Army Melia suggested a repeat colonoscopy. She was referred to GI.  She had a consultation with Dr. Allen Norris on 01/20/2019. She had no melena or bloody stool. She had no constipation. Stool was soft. Dr. Allen Norris recommended no further work-up unless she had another attack of diverticulitis.  Bilateral breast MRI on 03/24/2019 revealed 2 sites of enhancement in the medial left breast that were felt to represent more fat necrosis, very similar in appearance to the 08/19/2018 and 09/06/2018 MRI findings. These regions of enhancement largely correlated with fat on the T1 images. There was no  MRI evidence of malignancy in the right breast or elsewhere in the left breast. A 6 month follow-up breast MRI was recommended.   During the interim, she has been doing well. She reports that her lymphedema is stable. She has not been to the lymphedema clinic in 2 years. She periodically visit a message therapist. The patient notes that she can't afford to keep going for a 6 months follow-up MRIs secondary to costs.    Past Medical History:  Diagnosis Date  . Breast cancer ([place]) 2014   Chemo/radiaiton- Rt.  . Breast cancer of upper-outer quadrant of right female breast [facility]) March 2014   T1c,N0,M0; BRCA neg. ER: 60%; PR: 70%, her 2 neu not over expressed.   . Cancer [facility]) 2014   right breast.right wide excision with sentinal node biopsy on 06/21/12  . Lymphedema 2015   Right Arm.  . Personal history of chemotherapy   . Personal history of radiation therapy     Past Surgical History:  Procedure Laterality Date  . BREAST BIOPSY Right 2014   Positive  . BREAST LUMPECTOMY Right 2014  . BREAST SURGERY Right 06/21/2012   right wide excision with sentinal node biopsy/axilary dissection.  . COLONOSCOPY WITH PROPOFOL N/A 10/01/2016   Procedure: COLONOSCOPY WITH PROPOFOL;  Surgeon: Robert Bellow, MD;  Location: [facility] ENDOSCOPY;  Service: Endoscopy;  Laterality: N/A;  . STRABISMUS SURGERY      Family History  Problem Relation Age of Onset  . Cancer Paternal Uncle        Lung  . Cancer Cousin        Breast   .  Cancer Maternal Aunt        lung  . Diabetes Mother   . Hypertension Father   . Diabetes Father   . Breast cancer Neg Hx   . Ovarian cancer Neg Hx   . Colon cancer Neg Hx     Social History:  reports that she has never smoked. She has never used smokeless tobacco. She reports that she does not drink alcohol or use drugs.  She is going to the gym.She lives in [place]. She is losing weight on Weight Watchers.She works at the [employer]. 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 She lives in [place].  The patient is alone today.  Participants in the patient's visit and their role in the encounter included the patient and Waymon Budge, RN, today.  The intake visit was provided by Waymon Budge, RN.  Allergies:  Allergies  Allergen Reactions  . Tape     The patient showed a cutaneous reaction to Telfa applied beneath a Tegaderm dressing. Telfa appears to be the primary offensive material.    Current Medications: Current Outpatient Medications  Medication Sig Dispense Refill  . Multiple Vitamins-Calcium (VIACTIV MULTI-VITAMIN PO) Take 2 tablets by mouth daily.    . ondansetron (ZOFRAN) 4 MG tablet Take 1 tablet (4 mg total) by mouth every 8 (eight) hours as needed for nausea or vomiting. 10 tablet 0   No current facility-administered medications for this visit.    Review of Systems  Constitutional: Negative.  Negative for chills, diaphoresis, fever, malaise/fatigue and weight loss.       Doing well.  HENT: Negative.  Negative for congestion, ear pain, hearing loss, nosebleeds, sinus pain and sore throat.   Eyes: Negative.  Negative for blurred vision, double vision, photophobia and pain.  Respiratory: Negative.  Negative for cough, hemoptysis, sputum production and shortness of breath.   Cardiovascular: Negative.  Negative for chest pain, palpitations, orthopnea, leg swelling and PND.  Gastrointestinal: Negative.  Negative for abdominal pain, blood in stool, constipation, diarrhea, heartburn, melena, nausea and vomiting.  Genitourinary: Negative.  Negative for dysuria, frequency, hematuria and urgency.  Musculoskeletal: Negative.  Negative for back pain, joint pain, myalgias and neck pain.       Lymphedema of right arm (stable).  Skin: Negative.  Negative for itching and rash.  Neurological: Negative.  Negative for dizziness, tremors, sensory change, speech change, focal weakness,  weakness and headaches.  Endo/Heme/Allergies: Does not bruise/bleed easily.       Post-menopausal.   Psychiatric/Behavioral: Negative.  Negative for depression and memory loss. The patient is not nervous/anxious and does not have insomnia.   All other systems reviewed and are negative.  Performance status (ECOG): 0  Physical Exam  Constitutional: She is well-developed, well-nourished, and in no distress. No distress.  HENT:  Head: Normocephalic.  Short dark curly hair.  Eyes: Conjunctivae and EOM are normal. No scleral icterus.  Glasses.  Brown eyes.  Skin: She is not diaphoretic.  Psychiatric: Mood, memory, affect and judgment normal.  Nursing note reviewed.    No visits with results within 3 Day(s) from this visit.  Latest known visit with results is:  Office Visit on 01/21/2019  Component Date Value Ref Range Status  . WBC 02/07/2019 6.0  3.4 - 10.8 x10E3/uL Final  . RBC 02/07/2019 4.39  3.77 - 5.28 x10E6/uL Final  . Hemoglobin 02/07/2019 13.3  11.1 - 15.9 g/dL Final  . Hematocrit 02/07/2019 39.4  34.0 - 46.6 % Final  .  MCV 02/07/2019 90  79 - 97 fL Final  . MCH 02/07/2019 30.3  26.6 - 33.0 pg Final  . MCHC 02/07/2019 33.8  31.5 - 35.7 g/dL Final  . RDW 02/07/2019 13.3  11.7 - 15.4 % Final  . Platelets 02/07/2019 242  150 - 450 x10E3/uL Final  . Glucose 02/07/2019 74  65 - 99 mg/dL Final  . BUN 02/07/2019 12  6 - 24 mg/dL Final  . Creatinine, Ser 02/07/2019 0.82  0.57 - 1.00 mg/dL Final  . GFR calc non Af Amer 02/07/2019 84  >59 mL/min/1.73 Final  . GFR calc Af Amer 02/07/2019 97  >59 mL/min/1.73 Final  . BUN/Creatinine Ratio 02/07/2019 15  9 - 23 Final  . Sodium 02/07/2019 140  134 - 144 mmol/L Final  . Potassium 02/07/2019 4.3  3.5 - 5.2 mmol/L Final  . Chloride 02/07/2019 104  96 - 106 mmol/L Final  . CO2 02/07/2019 22  20 - 29 mmol/L Final  . Calcium 02/07/2019 8.4* 8.7 - 10.2 mg/dL Final  . Total Protein 02/07/2019 6.8  6.0 - 8.5 g/dL Final  . Albumin 02/07/2019  4.4  3.8 - 4.8 g/dL Final  . Globulin, Total 02/07/2019 2.4  1.5 - 4.5 g/dL Final  . Albumin/Globulin Ratio 02/07/2019 1.8  1.2 - 2.2 Final  . Bilirubin Total 02/07/2019 0.2  0.0 - 1.2 mg/dL Final  . Alkaline Phosphatase 02/07/2019 65  39 - 117 IU/L Final  . AST 02/07/2019 16  0 - 40 IU/L Final  . ALT 02/07/2019 13  0 - 32 IU/L Final  . Cholesterol, Total 02/07/2019 187  100 - 199 mg/dL Final  . Triglycerides 02/07/2019 74  0 - 149 mg/dL Final  . HDL 02/07/2019 69  >39 mg/dL Final  . VLDL Cholesterol Cal 02/07/2019 14  5 - 40 mg/dL Final  . LDL Chol Calc (NIH) 02/07/2019 104* 0 - 99 mg/dL Final  . Chol/HDL Ratio 02/07/2019 2.7  0.0 - 4.4 ratio Final   Comment:                                   T. Chol/HDL Ratio                                             Men  Women                               1/2 Avg.Risk  3.4    3.3                                   Avg.Risk  5.0    4.4                                2X Avg.Risk  9.6    7.1                                3X Avg.Risk 23.4   11.0   . TSH 02/07/2019 2.110  0.450 - 4.500 uIU/mL Final  . Neisseria Gonorrhea 01/21/2019 Negative  Final  . Chlamydia 01/21/2019 Negative   Final  . Trichomonas 01/21/2019 Negative   Final  . Bacterial Vaginitis (gardnerella) 01/21/2019 Negative   Final  . Candida Vaginitis 01/21/2019 Negative   Final  . Candida Glabrata 01/21/2019 Negative   Final  . Comment 01/21/2019 Normal Reference Range Bacterial Vaginosis - Negative   Final  . Comment 01/21/2019 Normal Reference Range Candida Species - Negative   Final  . Comment 01/21/2019 Normal Reference Range Candida Galbrata - Negative   Final  . Comment 01/21/2019 Normal Reference Range Trichomonas - Negative   Final  . Comment 01/21/2019 Normal Reference Ranger Chlamydia - Negative   Final  . Comment 01/21/2019 Normal Reference Range Neisseria Gonorrhea - Negative   Final  . SURGICAL PATHOLOGY 01/24/2019    Final-Edited                   Value:SURGICAL  PATHOLOGY CASE: [id number] PATIENT: [place] [facility] Surgical Pathology Report  Clinical History: (cm)  FINAL MICROSCOPIC DIAGNOSIS:  A. ENDOMETRIUM, BIOPSY: - Endometrioid type polyp(s). - There is no evidence of hyperplasia or malignancy.  GROSS DESCRIPTION:  Received in formalin is a 1.4 x 1 x 0.3 cm aggregate of dark red soft tissue and mucoid material.  Submitted 1 block.  (SSW 10/19)  Final Diagnosis performed by Enid Cutter, MD.   Electronically signed 01/25/2019 Technical component performed at [facility]. [facility], [place] [address], [place], [place] [address].  Professional component performed at [facility] [place] [address], [place], [place] [address].  Immunohistochemistry Technical component (if applicable) was performed at [facility]. [address], [place], [place], [place] [address].   IMMUNOHISTOCHEMISTRY DISCLAIMER (if applicable): Some of these immunohistochemical stains m                         ay have been developed and the performance characteristics determine by [facility]. Some may not have been cleared or approved by the U.S. Food and Drug Administration. The FDA has determined that such clearance or approval is not necessary. This test is used for clinical purposes. It should not be regarded as investigational or for research. This laboratory is certified under the [place] (CLIA-88) as qualified to perform high complexity clinical laboratory testing.  The controls stained appropriately.   . CA 27.29 02/07/2019 23.2  0.0 - 38.6 U/mL Final   Comment: Siemens Centaur Immunochemiluminometric Methodology [facility]) Values obtained with different assay methods or kits cannot be used interchangeably. Results cannot be interpreted as absolute evidence of the presence or absence of malignant disease.    Assessment:  Natalie Gallagher is a 48 y.o. female with stage  IA right breast invasive mammary carcinomastatus post wide excision and axillary lymph node dissection on 06/21/2012. Pathology revealed a 1.7 cm grade III ductal carcinoma. Thirteen lymph nodes were negative. Tumor was ER/PR positive and HER-2/neu negative. BRCA 1 and 2 testing was negative. Oncotype DXwas 29 (high) which translates into a distant recurrence of 19% at 10 yrs with tamoxifen alone.  The patient received 4 cycles ofAdriamycin and Cytoxan(08/17/2012 - 10/19/2012) followed by 12 weeks of Taxol(11/19/2012 - 01/25/2013). She completed local radiationon 04/20/2013. Shewas onTamoxifenfrom01/15/2015 - 04/21/2018.   CA27.29has been followed: 17 on 01/12/2015, 18.6 on 07/27/2015, 14.9 on 01/28/2016, 13.9 on 08/21/2016, 15.1 on 02/03/2017, 17.8 on 08/17/2017, 13.3 on 02/04/2018, and 11.2 on 08/12/2018.  Bilateral mammogramon 08/11/2016 revealed no evidence of malignancy.Bilateral breast  Gypsy 04/09/2017 demonstrated no evidence of malignancy. Bilateral mammogramon 08/13/2017 revealing no mammographic evidence of malignancy in either breast. There were stable post lumpectomy changes noted in the RIGHT breast, including a far posterior seroma.   Bilateral screening mammogram on 08/18/2018 revealed no evidence of malignancy. Bilateral breast MRI on 08/19/2018 revealed an indeterminate irregular enhancing mass in the upper inner left breast and indeterminate clumped non mass enhancement within the anteromedial left breast. Bilateral breast MRI on 03/24/2019 revealed 2 sites of enhancement in the medial left breast that were felt to represent more fat necrosis, similar in appearance to the 08/19/2018 and 09/06/2018 MRI findings. These regions of enhancement largely correlated with fat on the T1 images. There was no MRI evidence of malignancy in the right breast or elsewhere in the left breast.  Left breast biopsy on 09/06/2018 revealed fat necrosis with no evidence of malignancy  in both locations.   Hormone levelson 08/21/2017 revealed an estradiol level of 22.8 pg/mL and an FSH of 34.9 mIU/mL, both of which are consistent with a post-menopausal state.   Breast cancer index (BCI)on 09/07/2017 demonstrated a a high risk of recurrence at 8.3% (3.3% - 13.1%), but a low likelihood of benefit from extended endocrine therapy.  Symptomatically, she is doing well.  She denies any concerns.  Plan: 1.   Stage IA right breastcancer              Clinically, she continues to do well.             She denies any breast concerns.             Review breast MRI from 03/24/2019.                           Breast MRI revealed 2 sites of enhancement in the medial left breast that were felt to represent more fat necrosis.   Images were similar in appearance to the 08/19/2018 and 09/06/2018 MRI findings.             Discuss radiology recommendation for follow-up breast MRI in 6 months.   Patient declines secondary to cost.             She completed 5 years of endocrine therapy.             She opted for no extended adjuvant endocrine therapy.             Continue yearly mammogram. 2.   Schedule mammogram 08/19/2019. 3.   RTC after mammogram for MD assessment, labs (CBC with diff, CMp, CA27.29), and review of mammogram.   I discussed the assessment and treatment plan with the patient.  The patient was provided an opportunity to ask questions and all were answered.  The patient agreed with the plan and demonstrated an understanding of the instructions.  The patient was advised to call back if the symptoms worsen or if the condition fails to improve as anticipated.  I provided 10 minutes (1:26 PM - 1:35 PM) of face-to-face video visit time during this this encounter and > 50% was spent counseling as documented under my assessment and plan.  I provided these services from the [facility] office.  Lequita Asal, MD, PhD    03/29/2019, 1:26 PM  I, Selena Batten, am acting as scribe  for [employer]. Mike Gip, MD, PhD.  I, Melissa C. Mike Gip, MD, have reviewed the above documentation for accuracy and completeness, and I agree with  the above.

## 2019-03-28 NOTE — Progress Notes (Signed)
Confirmed Name and DOB. Denies any concerns.  

## 2019-03-29 ENCOUNTER — Inpatient Hospital Stay
Payer: No Typology Code available for payment source | Attending: Hematology and Oncology | Admitting: Hematology and Oncology

## 2019-03-29 ENCOUNTER — Encounter: Payer: Self-pay | Admitting: Hematology and Oncology

## 2019-03-29 DIAGNOSIS — R928 Other abnormal and inconclusive findings on diagnostic imaging of breast: Secondary | ICD-10-CM | POA: Diagnosis not present

## 2019-03-29 DIAGNOSIS — Z853 Personal history of malignant neoplasm of breast: Secondary | ICD-10-CM

## 2019-06-11 ENCOUNTER — Encounter: Payer: Self-pay | Admitting: Internal Medicine

## 2019-06-20 ENCOUNTER — Other Ambulatory Visit: Payer: Self-pay

## 2019-06-20 ENCOUNTER — Encounter: Payer: Self-pay | Admitting: Internal Medicine

## 2019-06-20 ENCOUNTER — Ambulatory Visit: Payer: No Typology Code available for payment source | Admitting: Internal Medicine

## 2019-06-20 VITALS — BP 124/76 | HR 64 | Temp 98.2°F | Ht 61.0 in | Wt 157.0 lb

## 2019-06-20 DIAGNOSIS — R197 Diarrhea, unspecified: Secondary | ICD-10-CM

## 2019-06-20 NOTE — Progress Notes (Signed)
Date:  06/20/2019   Name:  Natalie Gallagher   DOB:  14-Dec-1970   MRN:  [id number]   Chief Complaint: Diarrhea (X 1 year. Most of the time she is having diarrhea and never has a solid stool. In November she tried to contact Dr Allen Norris and he only recommended probiotics No pain. )  Diarrhea  This is a chronic problem. The current episode started more than 1 year ago. The problem occurs less than 2 times per day. The problem has been unchanged. The stool consistency is described as watery. The patient states that diarrhea does not awaken her from sleep. Pertinent negatives include no abdominal pain, bloating, chills, coughing, fever, headaches, vomiting or weight loss. Nothing aggravates the symptoms. Treatments tried: probiotics and fiber supplements. The treatment provided no relief.  She denies any other symptoms.  Her bowels move regularly without pain, urgency or incontinence - just the consistency is either watery or very soft, never formed.  She has no weight loss, no family hx of colitis or other bowel disease.  She has no post prandial pain.  Changing her diet to low fat has had no impact.   Lab Results  Component Value Date   CREATININE 0.82 02/07/2019   BUN 12 02/07/2019   NA 140 02/07/2019   K 4.3 02/07/2019   CL 104 02/07/2019   CO2 22 02/07/2019   Lab Results  Component Value Date   CHOL 187 02/07/2019   HDL 69 02/07/2019   LDLCALC 104 (H) 02/07/2019   TRIG 74 02/07/2019   CHOLHDL 2.7 02/07/2019   Lab Results  Component Value Date   TSH 2.110 02/07/2019   Lab Results  Component Value Date   HGBA1C 5.1 02/03/2017     Review of Systems  Constitutional: Negative for chills, fatigue, fever, unexpected weight change and weight loss.  HENT: Negative for trouble swallowing.   Respiratory: Negative for cough, chest tightness, shortness of breath and wheezing.   Cardiovascular: Negative for chest pain, palpitations and leg swelling.  Gastrointestinal: Positive for diarrhea.  Negative for abdominal distention, abdominal pain, bloating, blood in stool, rectal pain and vomiting.  Genitourinary: Negative for dysuria and hematuria.  Allergic/Immunologic: Negative for food allergies.  Neurological: Negative for dizziness, light-headedness and headaches.    Patient Active Problem List   Diagnosis Date Noted  . Abnormal MRI, breast 03/29/2019  . Endometrial thickening on ultrasound 02/12/2019  . Intramural leiomyoma of uterus 02/12/2019  . Lymphedema of right upper extremity 09/29/2017  . History of breast cancer in female 07/20/2013  . BMI 29.0-29.9,adult 11/28/2011    Allergies  Allergen Reactions  . Tape     The patient showed a cutaneous reaction to Telfa applied beneath a Tegaderm dressing. Telfa appears to be the primary offensive material.    Past Surgical History:  Procedure Laterality Date  . BREAST BIOPSY Right 2014   Positive  . BREAST LUMPECTOMY Right 2014  . BREAST SURGERY Right 06/21/2012   right wide excision with sentinal node biopsy/axilary dissection.  . COLONOSCOPY WITH PROPOFOL N/A 10/01/2016   Procedure: COLONOSCOPY WITH PROPOFOL;  Surgeon: Robert Bellow, MD;  Location: [facility] ENDOSCOPY;  Service: Endoscopy;  Laterality: N/A;  . STRABISMUS SURGERY      Social History   Tobacco Use  . Smoking status: Never Smoker  . Smokeless tobacco: Never Used  Substance Use Topics  . Alcohol use: No  . Drug use: No     Medication list has been reviewed and updated.  Current Meds  Medication Sig  . Multiple Vitamins-Calcium (VIACTIV MULTI-VITAMIN PO) Take 2 tablets by mouth daily.  . ondansetron (ZOFRAN) 4 MG tablet Take 1 tablet (4 mg total) by mouth every 8 (eight) hours as needed for nausea or vomiting.  . Probiotic Product (PROBIOTIC-10 PO) Take by mouth.    PHQ 2/9 Scores 06/20/2019 02/07/2019 12/15/2018 02/04/2018  PHQ - 2 Score 0 0 0 0  PHQ- 9 Score 0 - - -    BP Readings from Last 3 Encounters:  06/20/19 124/76  02/10/19  136/88  02/07/19 112/66    Physical Exam Vitals and nursing note reviewed.  Constitutional:      General: She is not in acute distress.    Appearance: Normal appearance. She is well-developed.  HENT:     Head: Normocephalic and atraumatic.  Cardiovascular:     Rate and Rhythm: Normal rate and regular rhythm.     Pulses: Normal pulses.     Heart sounds: No murmur.  Pulmonary:     Effort: Pulmonary effort is normal. No respiratory distress.     Breath sounds: No rhonchi.  Abdominal:     General: Abdomen is flat. Bowel sounds are normal.     Palpations: Abdomen is soft.     Tenderness: There is no abdominal tenderness. There is no guarding or rebound. Negative signs include Murphy's sign.  Musculoskeletal:        General: Normal range of motion.     Cervical back: Normal range of motion.  Lymphadenopathy:     Cervical: No cervical adenopathy.  Skin:    General: Skin is warm and dry.     Findings: No rash.  Neurological:     Mental Status: She is alert and oriented to person, place, and time.  Psychiatric:        Attention and Perception: Attention normal.        Mood and Affect: Mood normal.        Cognition and Memory: Cognition normal.     Wt Readings from Last 3 Encounters:  06/20/19 157 lb (71.2 kg)  02/10/19 160 lb 1.6 oz (72.6 kg)  02/07/19 156 lb (70.8 kg)    BP 124/76   Pulse 64   Temp 98.2 F (36.8 C) (Oral)   Ht 5\' 1"  (1.549 m)   Wt 157 lb (71.2 kg)   LMP 07/27/2012 (Exact Date)   SpO2 100%   BMI 29.66 kg/m   Assessment and Plan: 1. Diarrhea of presumed infectious origin Will get GI profile to rule out infectious cause No suggestions at this time for control of symptoms but will likely need Colonoscopy - GI Profile, Stool, PCR   Partially dictated using Editor, commissioning. Any errors are unintentional.  Halina Maidens, MD [facility]  06/20/2019

## 2019-06-26 LAB — GI PROFILE, STOOL, PCR

## 2019-06-27 ENCOUNTER — Other Ambulatory Visit: Payer: Self-pay

## 2019-06-27 DIAGNOSIS — R197 Diarrhea, unspecified: Secondary | ICD-10-CM

## 2019-06-27 NOTE — Progress Notes (Signed)
Referral placed for patient.  CM

## 2019-07-06 ENCOUNTER — Encounter: Payer: Self-pay | Admitting: Gastroenterology

## 2019-07-06 ENCOUNTER — Ambulatory Visit (INDEPENDENT_AMBULATORY_CARE_PROVIDER_SITE_OTHER): Payer: No Typology Code available for payment source | Admitting: Gastroenterology

## 2019-07-06 DIAGNOSIS — R197 Diarrhea, unspecified: Secondary | ICD-10-CM

## 2019-07-06 MED ORDER — LOPERAMIDE HCL 2 MG PO TABS: ORAL_TABLET | ORAL | 0 refills | Status: AC

## 2019-07-06 NOTE — Progress Notes (Signed)
Natalie Gallagher [address]  [place]  [place], [place] [address]  Main: [phone]  Fax: [phone]   Gastroenterology Consultation  Referring Provider:     Glean Hess, MD Primary Care Physician:  Glean Hess, MD Reason for Consultation:    Diarrhea        HPI:   Virtual Visit via Video Note  I connected with patient on 07/06/19 at 10:00 AM EDT by video ([email or web address]) and verified that I am speaking with the correct person using two identifiers.   I discussed the limitations, risks, security and privacy concerns of performing an evaluation and management service by video and the availability of in person appointments. I also discussed with the patient that there may be a patient responsible charge related to this service. The patient expressed understanding and agreed to proceed.  Location of the patient: Home Location of provider: Home Participating persons: Patient and provider only (Nursing staff checked in patient via phone but were not physically involved in the video interaction - see their notes)   History of Present Illness: Chief Complaint  Patient presents with  . New Patient (Initial Visit)    diarrhea    Natalie Gallagher is a 49 y.o. y/o female referred for consultation & management  by Dr. Army Melia, Jesse Sans, MD.  Patient reports 1 year history of diarrhea.  2-3 bowel movements that are loose daily.  No blood in stool.  Prior to this he is to have 1 formed bowel movement daily.  No abdominal pain.  No nausea vomiting, dysphagia, heartburn, weight loss.  Infectious panel including C. difficile was negative recently.  Patient denies any new medications during this time.  Does not consume food beverages, sodas, or candy.  Does chew gum.  Limited caffeine intake about 3 times a month.  No family history of colon cancer.  Last colonoscopy 2018 for screening was normal  Past Medical History:  Diagnosis Date  . Breast cancer ([place]) 2014   Chemo/radiaiton- Rt.  . Breast cancer of upper-outer quadrant of right female breast [facility]) March 2014   T1c,N0,M0; BRCA neg. ER: 60%; PR: 70%, her 2 neu not over expressed.   . Cancer [facility]) 2014   right breast.right wide excision with sentinal node biopsy on 06/21/12  . Lymphedema 2015   Right Arm.  . Personal history of chemotherapy   . Personal history of radiation therapy     Past Surgical History:  Procedure Laterality Date  . BREAST BIOPSY Right 2014   Positive  . BREAST LUMPECTOMY Right 2014  . BREAST SURGERY Right 06/21/2012   right wide excision with sentinal node biopsy/axilary dissection.  . COLONOSCOPY WITH PROPOFOL N/A 10/01/2016   Procedure: COLONOSCOPY WITH PROPOFOL;  Surgeon: Robert Bellow, MD;  Location: [facility] ENDOSCOPY;  Service: Endoscopy;  Laterality: N/A;  . STRABISMUS SURGERY      Prior to Admission medications   Medication Sig Start Date End Date Taking? Authorizing Provider  Multiple Vitamins-Calcium (VIACTIV MULTI-VITAMIN PO) Take 2 tablets by mouth daily.   Yes [provider]  Probiotic Product (PROBIOTIC-10 PO) Take by mouth.   Yes [provider]  loperamide (IMODIUM A-D) 2 MG tablet Take 1 tablet (2 mg total) by mouth 2 (two) times daily for 5 days, THEN 1 tablet (2 mg total) daily for 7 days. Stop if constipation or no bowel movement for 1 day. 07/13/19 07/25/19  Virgel Manifold, MD  ondansetron (ZOFRAN) 4 MG tablet Take 1 tablet (4  mg total) by mouth every 8 (eight) hours as needed for nausea or vomiting. Patient not taking: Reported on 07/06/2019 03/09/19   Lequita Asal, MD    Family History  Problem Relation Age of Onset  . Cancer Paternal Uncle        Lung  . Cancer Cousin        Breast   . Cancer Maternal Aunt        lung  . Diabetes Mother   . Hypertension Father   . Diabetes Father   . Breast cancer Neg Hx   . Ovarian cancer Neg Hx   . Colon cancer Neg Hx      Social History   Tobacco Use  . Smoking  status: Never Smoker  . Smokeless tobacco: Never Used  Substance Use Topics  . Alcohol use: No  . Drug use: No    Allergies as of 07/06/2019 - Review Complete 07/06/2019  Allergen Reaction Noted  . Tape  08/18/2012    Review of Systems:    All systems reviewed and negative except where noted in HPI.   Observations/Objective:  Labs: CBC    Component Value Date/Time   WBC 6.0 02/07/2019 0907   WBC 5.6 08/12/2018 0816   RBC 4.39 02/07/2019 0907   RBC 4.61 08/12/2018 0816   HGB 13.3 02/07/2019 0907   HCT 39.4 02/07/2019 0907   PLT 242 02/07/2019 0907   MCV 90 02/07/2019 0907   MCV 87 05/12/2014 1041   MCH 30.3 02/07/2019 0907   MCH 30.4 08/12/2018 0816   MCHC 33.8 02/07/2019 0907   MCHC 34.0 08/12/2018 0816   RDW 13.3 02/07/2019 0907   RDW 12.9 05/12/2014 1041   LYMPHSABS 1.6 08/12/2018 0816   LYMPHSABS CANCELED 02/04/2018 0933   LYMPHSABS 1.5 05/12/2014 1041   MONOABS 0.4 08/12/2018 0816   MONOABS 0.7 05/12/2014 1041   EOSABS 0.1 08/12/2018 0816   EOSABS CANCELED 02/04/2018 0933   EOSABS 0.1 05/12/2014 1041   BASOSABS 0.0 08/12/2018 0816   BASOSABS CANCELED 02/04/2018 0933   BASOSABS 0.0 05/12/2014 1041   CMP     Component Value Date/Time   NA 140 02/07/2019 0907   NA 144 05/12/2014 1041   K 4.3 02/07/2019 0907   K 3.8 05/12/2014 1041   CL 104 02/07/2019 0907   CL 106 05/12/2014 1041   CO2 22 02/07/2019 0907   CO2 32 05/12/2014 1041   GLUCOSE 74 02/07/2019 0907   GLUCOSE 75 08/12/2018 0816   GLUCOSE 75 05/12/2014 1041   BUN 12 02/07/2019 0907   BUN 14 05/12/2014 1041   CREATININE 0.82 02/07/2019 0907   CREATININE 0.93 05/12/2014 1041   CALCIUM 8.4 (L) 02/07/2019 0907   CALCIUM 8.7 05/12/2014 1041   PROT 6.8 02/07/2019 0907   PROT 7.2 05/12/2014 1041   ALBUMIN 4.4 02/07/2019 0907   ALBUMIN 3.1 (L) 05/12/2014 1041   AST 16 02/07/2019 0907   AST 9 (L) 05/12/2014 1041   ALT 13 02/07/2019 0907   ALT 21 05/12/2014 1041   ALKPHOS 65 02/07/2019 0907     ALKPHOS 59 05/12/2014 1041   BILITOT 0.2 02/07/2019 0907   BILITOT 0.2 05/12/2014 1041   GFRNONAA 84 02/07/2019 0907   GFRNONAA >60 05/12/2014 1041   GFRNONAA >60 11/09/2013 1139   GFRAA 97 02/07/2019 0907   GFRAA >60 05/12/2014 1041   GFRAA >60 11/09/2013 1139    Imaging Studies: No results found.  Assessment and Plan:   Zayla Agar is a  49 y.o. y/o female has been referred for diarrhea  Assessment and Plan: Infectious work-up negative  Labs are otherwise reassuring with no alarm symptoms present  Patient has been unable to identify any dietary triggers.  Will obtain celiac panel  Patient advised on lactose-free diet for 1 week and if symptoms are not improved, Imodium daily 1-2 times a day for 1 to 2 weeks.  Reassess symptoms after stopping Imodium and if symptoms continue, patient willing to consider colonoscopy  Follow Up Instructions: 4 weeks  I discussed the assessment and treatment plan with the patient. The patient was provided an opportunity to ask questions and all were answered. The patient agreed with the plan and demonstrated an understanding of the instructions.   The patient was advised to call back or seek an in-person evaluation if the symptoms worsen or if the condition fails to improve as anticipated.  I provided 15 minutes of face-to-face time via video software during this encounter.  Additional time was spent in reviewing patient's chart, placing orders etc.   Virgel Manifold, MD  Speech recognition software was used to dictate the above note.

## 2019-07-06 NOTE — Patient Instructions (Signed)
Natalie Gallagher, please see the below diet plan for lactose free diet for the next week. You do NOT need to avoid Eggs  Lactose-Controlled Eating Plan, Adult Lactose intolerance is when the body is not able to digest lactose, a natural sugar that is found in milk and milk products. If you are lactose intolerant, avoiding foods and drinks that contain lactose may help ease digestive problems such as diarrhea or stomach pain. What are tips for following this plan? Reading food labels  Check food ingredient lists carefully. Avoid foods made with butter, cream, milk, milk solids, milk powder, curd, caseinate, or whey.  Avoid products with the note "may contain milk."  Look for the words "lactose-free" or "lactose-reduced" on food labels. You can eat lactose-free foods, and you may be able to eat small amounts of lactose-reduced foods. Shopping  Look for nondairy substitutes, such as: ? Nondairy creamer. ? Almond or soy milk. ? Soy or coconut yogurt. ? Dairy-free cheese.  Buy lactose-free cow milk. Cooking  Avoid cooking with butter. Use vegetable, nut, and seed oils instead.  Prepare soups without cream. Use other products to thicken soups, such as corn starch or tomato paste. Meal planning  Avoid eating foods that contain lactose.  Some people with lactose intolerance can eat foods that contain small amounts of lactose. Foods that contain less than 1 gram of lactose per serving include: ? 1-2 oz of aged cheese, such as Parmesan, Swiss, or cheddar. ? 2 Tbsp of cream cheese. ? ? cup of cottage cheese. ?  cup of ricotta cheese.  Some people are able to tolerate cultured dairy products, such as yogurt, buttermilk, and kefir. The healthy bacteria in these products helps you digest lactose.  If you decide to try a food that contains lactose: ? Eat only one food with lactose in it at a time. ? Eat only a small amount of the food. ? Stop eating the food if your symptoms return. Getting enough  calcium  Milk and milk products contain a lot of calcium, which is an important nutrient for your health. When you avoid milk and milk products, make sure to get calcium from other foods.  Talk with your health care provider about how much calcium you need each day. The amount of calcium you need each day depends on your age and overall health.  Nondairy foods that are high in calcium include: ? Sardines and canned salmon. ? Dried beans. ? Almonds. ? Turnip greens, collards, kale, and broccoli. ? Calcium-fortified soy milk and tofu. ? Calcium-fortified orange juice.  Talk with your health care provider about vitamin and mineral supplements. Take supplements only as directed. Summary  Avoiding foods and drinks that contain lactose may help ease digestive problems such as diarrhea or stomach pain.  When you avoid milk and milk products, make sure to get calcium from other foods.  Take vitamin and mineral supplements only as directed by your health care provider. This information is not intended to replace advice given to you by your health care provider. Make sure you discuss any questions you have with your health care provider. Document Revised: 03/06/2017 Document Reviewed: 07/04/2016 Elsevier Patient Education  [place].

## 2019-07-08 LAB — CELIAC DISEASE AB SCREEN W/RFX
Antigliadin Abs, IgA: 11 units (ref 0–19)
IgA/Immunoglobulin A, Serum: 121 mg/dL (ref 87–352)
Transglutaminase IgA: <2 U/mL (ref 0–3)

## 2019-08-03 ENCOUNTER — Other Ambulatory Visit: Payer: Self-pay

## 2019-08-03 ENCOUNTER — Encounter: Payer: Self-pay | Admitting: Gastroenterology

## 2019-08-03 ENCOUNTER — Telehealth (INDEPENDENT_AMBULATORY_CARE_PROVIDER_SITE_OTHER): Payer: No Typology Code available for payment source | Admitting: Gastroenterology

## 2019-08-03 DIAGNOSIS — R197 Diarrhea, unspecified: Secondary | ICD-10-CM | POA: Diagnosis not present

## 2019-08-03 NOTE — Progress Notes (Signed)
Natalie Antigua, MD [address]  [place]  [place], [place] [address]  Main: [phone]  Fax: [phone]   Primary Care Physician: Glean Hess, MD  Virtual Visit via Video Note  I connected with patient on 08/03/19 at 10:00 AM EDT by video (using [email or web address]) and verified that I am speaking with the correct person using two identifiers.   I discussed the limitations, risks, security and privacy concerns of performing an evaluation and management service by video and the availability of in person appointments. I also discussed with the patient that there may be a patient responsible charge related to this service. The patient expressed understanding and agreed to proceed.  Location of Patient: Home Location of Provider: Home Persons involved: Patient and provider only (Nursing staff checked in patient via phone but were not physically involved in the video interaction - see their notes)   History of Present Illness: Chief Complaint  Patient presents with  . Diarrhea    Patient stated that she has 3-4 BMs daily, not eating diary but continues to have loose stools, No N/V    HPI: Natalie Gallagher is a 49 y.o. female here for follow-up of diarrhea.  Still having 2-3 bowel movements a day that are watery to loose in consistency.  This has been going on for 1 year.  Tried lactose-free diet for 2 weeks but states it did not make a difference in the consistency of her stool.  No blood in stool no nausea or vomiting.  Celiac panel negative.  GI profile for infectious work-up negative.  No anemia on blood work.  Previous history: Patient denies any new medications during this time.  Does not consume food beverages, sodas, or candy.  Does chew gum.  Limited caffeine intake about 3 times a month.  No family history of colon cancer.  Last colonoscopy 2018 for screening was normal  Current Outpatient Medications  Medication Sig Dispense Refill  . Multiple Vitamins-Calcium  (VIACTIV MULTI-VITAMIN PO) Take 2 tablets by mouth daily.    . ondansetron (ZOFRAN) 4 MG tablet Take 1 tablet (4 mg total) by mouth every 8 (eight) hours as needed for nausea or vomiting. (Patient not taking: Reported on 08/02/2019) 10 tablet 0  . Probiotic Product (PROBIOTIC-10 PO) Take by mouth.     No current facility-administered medications for this visit.    Allergies as of 08/03/2019 - Review Complete 08/03/2019  Allergen Reaction Noted  . Tape  08/18/2012    Review of Systems:    All systems reviewed and negative except where noted in HPI.   Observations/Objective:  Labs: CMP     Component Value Date/Time   NA 140 02/07/2019 0907   NA 144 05/12/2014 1041   K 4.3 02/07/2019 0907   K 3.8 05/12/2014 1041   CL 104 02/07/2019 0907   CL 106 05/12/2014 1041   CO2 22 02/07/2019 0907   CO2 32 05/12/2014 1041   GLUCOSE 74 02/07/2019 0907   GLUCOSE 75 08/12/2018 0816   GLUCOSE 75 05/12/2014 1041   BUN 12 02/07/2019 0907   BUN 14 05/12/2014 1041   CREATININE 0.82 02/07/2019 0907   CREATININE 0.93 05/12/2014 1041   CALCIUM 8.4 (L) 02/07/2019 0907   CALCIUM 8.7 05/12/2014 1041   PROT 6.8 02/07/2019 0907   PROT 7.2 05/12/2014 1041   ALBUMIN 4.4 02/07/2019 0907   ALBUMIN 3.1 (L) 05/12/2014 1041   AST 16 02/07/2019 0907   AST 9 (L) 05/12/2014 1041   ALT  13 02/07/2019 0907   ALT 21 05/12/2014 1041   ALKPHOS 65 02/07/2019 0907   ALKPHOS 59 05/12/2014 1041   BILITOT 0.2 02/07/2019 0907   BILITOT 0.2 05/12/2014 1041   GFRNONAA 84 02/07/2019 0907   GFRNONAA >60 05/12/2014 1041   GFRNONAA >60 11/09/2013 1139   GFRAA 97 02/07/2019 0907   GFRAA >60 05/12/2014 1041   GFRAA >60 11/09/2013 1139   Lab Results  Component Value Date   WBC 6.0 02/07/2019   HGB 13.3 02/07/2019   HCT 39.4 02/07/2019   MCV 90 02/07/2019   PLT 242 02/07/2019    Imaging Studies: No results found.  Assessment and Plan:   Natalie Gallagher is a 49 y.o. y/o female here for follow-up of  diarrhea  Assessment and Plan: Infectious work-up has been negative.  Lactose-free diet did not help.  Celiac panel negative.  Patient does not consume daily caffeine or products with sugar or sweeteners.  At this point, we discussed options of conservative management with Metamucil to help bulk stool versus proceeding with colonoscopy.  Patient would like to schedule the colonoscopy at this time and try Metamucil in the meantime and if symptoms resolve she will cancel the procedure.  I have discussed alternative options, risks & benefits,  which include, but are not limited to, bleeding, infection, perforation,respiratory complication & drug reaction.  The patient agrees with this plan & written consent will be obtained.     Follow Up Instructions:    I discussed the assessment and treatment plan with the patient. The patient was provided an opportunity to ask questions and all were answered. The patient agreed with the plan and demonstrated an understanding of the instructions.   The patient was advised to call back or seek an in-person evaluation if the symptoms worsen or if the condition fails to improve as anticipated.  I provided 15 minutes of face-to-face time via video software during this encounter. Additional time was spent in reviewing patient's chart, placing orders etc.   Virgel Manifold, MD  Speech recognition software was used to dictate this note.

## 2019-08-04 ENCOUNTER — Telehealth: Payer: Self-pay

## 2019-08-04 NOTE — Telephone Encounter (Signed)
Called patient and had to leave a voicemail letting her know that she is needing to bring someone with her in order for [place] to schedule her colonoscopy per Dr. Bonna Gains and per protocol.

## 2019-08-11 ENCOUNTER — Other Ambulatory Visit: Payer: Self-pay

## 2019-08-11 ENCOUNTER — Encounter: Payer: Self-pay | Admitting: Certified Nurse Midwife

## 2019-08-11 ENCOUNTER — Ambulatory Visit (INDEPENDENT_AMBULATORY_CARE_PROVIDER_SITE_OTHER): Payer: No Typology Code available for payment source | Admitting: Certified Nurse Midwife

## 2019-08-11 ENCOUNTER — Ambulatory Visit (INDEPENDENT_AMBULATORY_CARE_PROVIDER_SITE_OTHER): Payer: No Typology Code available for payment source

## 2019-08-11 ENCOUNTER — Other Ambulatory Visit (HOSPITAL_COMMUNITY)
Admission: RE | Admit: 2019-08-11 | Discharge: 2019-08-11 | Disposition: A | Discharge status: Home / Self Care | Payer: No Typology Code available for payment source | Source: Ambulatory Visit | Attending: Certified Nurse Midwife | Admitting: Certified Nurse Midwife

## 2019-08-11 ENCOUNTER — Encounter: Payer: No Typology Code available for payment source | Admitting: Certified Nurse Midwife

## 2019-08-11 ENCOUNTER — Other Ambulatory Visit: Payer: No Typology Code available for payment source

## 2019-08-11 ENCOUNTER — Telehealth: Payer: Self-pay

## 2019-08-11 VITALS — BP 112/70 | HR 76 | Ht 61.0 in | Wt 155.2 lb

## 2019-08-11 DIAGNOSIS — Z853 Personal history of malignant neoplasm of breast: Secondary | ICD-10-CM

## 2019-08-11 DIAGNOSIS — R9389 Abnormal findings on diagnostic imaging of other specified body structures: Secondary | ICD-10-CM

## 2019-08-11 DIAGNOSIS — N950 Postmenopausal bleeding: Secondary | ICD-10-CM

## 2019-08-11 DIAGNOSIS — R197 Diarrhea, unspecified: Secondary | ICD-10-CM

## 2019-08-11 DIAGNOSIS — D251 Intramural leiomyoma of uterus: Secondary | ICD-10-CM

## 2019-08-11 DIAGNOSIS — Z113 Encounter for screening for infections with a predominantly sexual mode of transmission: Secondary | ICD-10-CM

## 2019-08-11 NOTE — Telephone Encounter (Signed)
Patient called [place] back and scheduled her colonoscopy for 09/08/2019 at [facility] and her COVID test on 09/06/2019. Patient was told that I would send her her information by mail and to call us with questions.

## 2019-08-11 NOTE — Progress Notes (Signed)
GYN ENCOUNTER NOTE  Subjective:       Natalie Gallagher is a 49 y.o. G0P0 female here for repeat ultrasound and endometrial biopsy due to post menopausal bleeding.   Last seen on 02/10/2019 with normal findings.    Reports monthly vaginal bleeding since last visit, almost like a menstrual cycle.   Denies difficulty breathing or respiratory distress, chest pain, abdominal pain, excessive vaginal bleeding, dysuria, and leg pain or swelling.    Gynecologic History  Patient's last menstrual period was 07/27/2012 (exact date).  Contraception: status post hysterectomy  Last Pap: 01/2017. Results were: Neg/Neg  Last MRI: 03/2019. Results were: BI-RADS 3  Obstetric History  OB History  Gravida Para Term Preterm AB Living  0            SAB TAB Ectopic Multiple Live Births             Obstetric Comments  Age first period 36    Past Medical History:  Diagnosis Date  . Breast cancer ([facility]) 2014   Chemo/radiaiton- Rt.  . Breast cancer of upper-outer quadrant of right female breast [facility]) March 2014   T1c,N0,M0; BRCA neg. ER: 60%; PR: 70%, her 2 neu not over expressed.   . Cancer [facility]) 2014   right breast.right wide excision with sentinal node biopsy on 06/21/12  . Lymphedema 2015   Right Arm.  . Personal history of chemotherapy   . Personal history of radiation therapy     Past Surgical History:  Procedure Laterality Date  . BREAST BIOPSY Right 2014   Positive  . BREAST LUMPECTOMY Right 2014  . BREAST SURGERY Right 06/21/2012   right wide excision with sentinal node biopsy/axilary dissection.  . COLONOSCOPY WITH PROPOFOL N/A 10/01/2016   Procedure: COLONOSCOPY WITH PROPOFOL;  Surgeon: Robert Bellow, MD;  Location: [facility] ENDOSCOPY;  Service: Endoscopy;  Laterality: N/A;  . STRABISMUS SURGERY      Current Outpatient Medications on File Prior to Visit  Medication Sig Dispense Refill  . Multiple Vitamins-Calcium (VIACTIV MULTI-VITAMIN PO) Take 2 tablets by mouth daily.    .  Probiotic Product (PROBIOTIC-10 PO) Take by mouth.     No current facility-administered medications on file prior to visit.    Allergies  Allergen Reactions  . Tape     The patient showed a cutaneous reaction to Telfa applied beneath a Tegaderm dressing. Telfa appears to be the primary offensive material.    Social History   Socioeconomic History  . Marital status: Divorced    Spouse name: Not on file  . Number of children: 0  . Years of education: Not on file  . Highest education level: Not on file  Occupational History  . Not on file  Tobacco Use  . Smoking status: Never Smoker  . Smokeless tobacco: Never Used  Substance and Sexual Activity  . Alcohol use: No  . Drug use: No  . Sexual activity: Not Currently    Birth control/protection: None  Other Topics Concern  . Not on file  Social History Narrative  . Not on file   Social Determinants of Health   Financial Resource Strain:   . Difficulty of Paying Living Expenses:   Food Insecurity:   . Worried About Charity fundraiser in the Last Year:   . Arboriculturist in the Last Year:   Transportation Needs:   . Film/video editor (Medical):   Marland Kitchen Lack of Transportation (Non-Medical):   Physical Activity:   . Days  of Exercise per Week:   . Minutes of Exercise per Session:   Stress:   . Feeling of Stress :   Social Connections:   . Frequency of Communication with Friends and Family:   . Frequency of Social Gatherings with Friends and Family:   . Attends Religious Services:   . Active Member of Clubs or Organizations:   . Attends Archivist Meetings:   Marland Kitchen Marital Status:   Intimate Partner Violence:   . Fear of Current or Ex-Partner:   . Emotionally Abused:   Marland Kitchen Physically Abused:   . Sexually Abused:     Family History  Problem Relation Age of Onset  . Cancer Paternal Uncle        Lung  . Cancer Cousin        Breast   . Cancer Maternal Aunt        lung  . Diabetes Mother   . Hypertension  Father   . Diabetes Father   . Breast cancer Neg Hx   . Ovarian cancer Neg Hx   . Colon cancer Neg Hx     The following portions of the patient's history were reviewed and updated as appropriate: allergies, current medications, past family history, past medical history, past social history, past surgical history and problem list.  Review of Systems  ROS negative except as noted above. Information obtained from patient.   Objective:   BP 112/70   Pulse 76   Ht '5\' 1"'$  (1.549 m)   Wt 155 lb 4 oz (70.4 kg)   LMP 07/27/2012 (Exact Date)   BMI 29.33 kg/m   CONSTITUTIONAL: Well-developed, well-nourished female in no acute distress.   PELVIC:  External Genitalia: Normal  BUS: Normal  Vagina: Normal  Cervix: Normal   MUSCULOSKELETAL: Normal range of motion. No tenderness.  No cyanosis, clubbing, or edema.  ULTRASOUND REPORT  Location: [facility]  Date of Service: 08/11/2019   TECHNIQUE: Both transabdominal and transvaginal ultrasound examinations of the pelvis were performed. Transabdominal technique was performed for global imaging of the pelvis including uterus, ovaries, adnexal regions, and pelvic cul-de-sac. It was necessary to proceed with endovaginal exam following the transabdominal exam to visualize the endometrium and adnexa.  Indications:AUB   Findings:  The uterus is anteverted and measures 7.5 x 3.6 x 4.6 cm. Echo texture is heterogenous with evidence of focal masses. Within the uterus are multiple suspected fibroids measuring: Fibroid 1:Anterior Fundal IM 2.8x 2.7x 2.9 cm Fibroid 2:Pedunculated fundal 2.83x 2.0 x 1.9  The Endometrium measures 12.3 mm.  Right Ovary measures 2.2 x 1.1 x 2.1 cm. It is normal in appearance. Left Ovary measures 2.2 x 1.4 x 1.8 cm. It is normal in appearance. Lt hypoechoic lesion with septations, measuring 1.3 x 1.1 x 1.2 cm. Survey of the adnexa demonstrates no adnexal masses. There is no free fluid in the cul de  sac.  Impression: 1. Multiple fibroids seen same as on prior exam. 2. Lt septate cyst as described above. 3. Endometrium is measuring upper limites at 12.3 cm.  Recommendations: 1.Clinical correlation with the patient's History and Physical Exam.  Assessment:   1. Screening examination for STD (sexually transmitted disease)  - HIV Antibody (routine testing w rflx)  2. Intramural leiomyoma of uterus   3. Endometrial thickening on ultrasound   4. History of cancer of right breast   Plan:   Labs today, see orders.   Ultrasound findings reviewed with patient, verbalized understanding.   Endometrial Biopsy today,  see note below.   Reviewed red flag symptoms and when to call.   RTC as indicated.    Diona Fanti, CNM [facility], [facility] 08/11/19 [id number]   Endometrial Biopsy Note:  A timeout protocol was performed prior to initiating the procedure  Vaginal speculum was inserted, and the cervix was visualized.   The cervix was cleansed with antiseptic solution.  The device was inserted through the cervical canal, into the uterine cavity, and up to the fundus. A tenaculum was placed on the cervix.  The depth of the uterus was determined with the instrument The instrument was withdrawn as it was rotated 3-4 times to obtain the sample which was sent in formalin to pathology    Followup: The patient tolerated the procedure well without complications.  Standard post-procedure care is explained and return precautions are given.   Diona Fanti, CNM [facility], [facility] 08/11/19 9:33 AM

## 2019-08-11 NOTE — Patient Instructions (Signed)
Postmenopausal Bleeding  Postmenopausal bleeding is any bleeding that occurs after menopause. Menopause is when a woman's period stops. Any type of bleeding after menopause should be checked by your doctor. Treatment will depend on the cause. Follow these instructions at home:  Pay attention to any changes in your symptoms.  Avoid using tampons and douches as told by your doctor.  Change your pads regularly.  Get regular pelvic exams and Pap tests.  Take iron pills as told by your doctor.  Take over-the-counter and prescription medicines only as told by your doctor.  Keep all follow-up visits as told by your doctor. This is important. Contact a doctor if:  Your bleeding lasts for more than 1 week.  You have pain in your belly (abdomen).  You have bleeding during or after sex.  You have bleeding that happens more often than every 3 weeks. Get help right away if:  You have fever, chills, headache, dizziness, muscle aches, or bleeding.  You have very bad pain with bleeding.  You have clumps of blood (blood clots) coming from your vagina.  You have a lot of bleeding, and: ? You use more than 1 pad an hour. ? This kind of bleeding has never happened before.  You feel like you are going to pass out (faint). Summary  Any type of bleeding after menopause should be checked by your doctor.  Pay attention to any changes in your symptoms.  Keep all follow-up visits as told by your doctor. This information is not intended to replace advice given to you by your health care provider. Make sure you discuss any questions you have with your health care provider. Document Revised: 06/10/2018 Document Reviewed: 04/29/2016 Elsevier Patient Education  2020 [place].    Uterine Fibroids  Uterine fibroids are lumps of tissue (tumors) in your womb (uterus). They are not cancer (are benign). Most women with this condition do not need treatment. Sometimes fibroids can affect your  ability to have children (your fertility). If that happens, you may need surgery to take out the fibroids. Follow these instructions at home:  Take over-the-counter and prescription medicines only as told by your doctor. Your doctor may suggest NSAIDs (such as aspirin or ibuprofen) to help with pain.  Ask your doctor if you should: ? Take iron pills. ? Eat more foods that have iron in them, such as dark green, leafy vegetables.  If directed, apply heat to your back or belly to reduce pain. Use the heat source that your doctor recommends, such as a moist heat pack or a heating pad. ? Put a towel between your skin and the heat source. ? Leave the heat on for 20-30 minutes. ? Remove the heat if your skin turns bright red. This is especially important if you are unable to feel pain, heat, or cold. You may have a greater risk of getting burned.  Pay close attention to your period (menstrual) cycles. Tell your doctor about any changes, such as: ? A heavier blood flow than usual. ? Needing to use more pads or tampons than normal. ? A change in how many days your period lasts. ? A change in symptoms that come with your period, such as cramps or back pain.  Keep all follow-up visits as told by your doctor. This is important. Your doctor may need to watch your fibroids over time for any changes. Contact a doctor if you:  Have pain that does not get better with medicine or heat, such as pain  or cramps in: ? Your back. ? The area between your hip bones (pelvic area). ? Your belly.  Have new bleeding between your periods.  Have more bleeding during or between your periods.  Feel very tired or weak.  Feel light-headed. Get help right away if you:  Pass out (faint).  Have pain in the area between your hip bones that suddenly gets worse.  Have bleeding that soaks a tampon or pad in 30 minutes or less. Summary  Uterine fibroids are lumps of tissue (tumors) in your womb (uterus). They are  not cancer.  The only treatment that most women need is taking aspirin or ibuprofen for pain.  Contact a doctor if you have pain or cramps that do not get better with medicine.  Make sure you know what symptoms you should get help for right away. This information is not intended to replace advice given to you by your health care provider. Make sure you discuss any questions you have with your health care provider. Document Revised: 03/06/2017 Document Reviewed: 02/17/2017 Elsevier Patient Education  2020 [employer].

## 2019-08-12 LAB — ESTRADIOL: Estradiol: 86.9 pg/mL

## 2019-08-12 LAB — FSH/LH
FSH: 68.7 m[IU]/mL
LH: 66.3 m[IU]/mL

## 2019-08-12 LAB — HIV ANTIBODY (ROUTINE TESTING W REFLEX): HIV Screen 4th Generation wRfx: NONREACTIVE

## 2019-08-12 LAB — PROGESTERONE: Progesterone: 0.5 ng/mL

## 2019-08-15 LAB — SURGICAL PATHOLOGY

## 2019-08-15 NOTE — Progress Notes (Signed)
Do you think I should resample to the endometrium? ~JML

## 2019-08-18 NOTE — Progress Notes (Signed)
[facility]  [address] [place], [place] [address] Phone: [phone]  Fax: [phone]   Clinic Day:  08/22/2019  Referring physician: Glean Hess, MD  Chief Complaint: Natalie Gallagher is a 49 y.o. female with stage IA right breast cancer who is seen for a 5 month assessment.   HPI: The patient was last seen in the medical oncology clinic on 03/29/2019 via telemedicine. At that time, she was doing well. She denied any concerns.  She had a virtual visit with Dr. Bonna Gains on 07/06/2019 for diarrhea. Patient reported 1 year history of diarrhea.  She described 2-3 bowel movements a day that were loose.  She denied any blood in the stool.  Prior to visit, she had 1 formed bowel movement daily.  No abdominal pain.  No nausea vomiting, dysphagia, heartburn, weight loss. C. difficile was negative recently. Patient denied any new medications. Last colonoscopy in 2018 was normal. Patient advised on lactose-free diet for 1 week and if symptoms improved.  She was to take Imodium daily 1-2 times a day for 1 to 2 weeks. Celiac panel was normal.   She had a follow up with Dr. Bonna Gains on 08/03/2019 via telemedicine. She was having 2-3 bowel movements a day that were watery to loose in consistency. She tried lactose-free diet for 2 weeks but stated it did not make a difference in the consistency of her stool.  She denied any blood in stool; no nausea or vomiting.  Celiac panel was negative.  GI profile for infectious work-up was negative.  She had no anemia. She discussed options of conservative management with Metamucil to help bulk stool versus proceeding with colonoscopy. Patient wanted a colonoscopy at that time and try Metamucil in the meantime; if symptoms resolved, she wold cancel the procedure.  Transvaginal and pelvis ultrasound on 08/11/2019 revealed multiple fibroids. There was a left septate cyst.  Endometrium measured upper limits at 12.3 mm. A nabothian  cyst was seen with in the cervix.  Endometrial biopsy on 08/11/2019 revealed a scant benign inactive endometrium. There was benign cervical glandular mucosa. There was no hyperplasia or malignancy.   Bilateral diagnostic mammogram and ultrasound on 08/19/2019 revealed no mammographic or sonographic evidence of malignancy or other abnormal finding at the small area of discoloration in the lateral right breast. There was no mammographic evidence of malignancy in the left breast. Screening mammogram in one year was recommended.  During the interim, the patient has been "doing good". Her menstrual has come back and she as been having regular cycles since October or November of 2020. She is not on any hormonal therapy. Labs on 08/11/2019 showed LH 66.3, FSH 68.7, estradiol was 86.9. Dani Gobble, CNM her gynecologist discussed a hysterectomy to lower chances of her cancer returning. Patient is BRCA-1 and BCRA-2 negative. She will consider the procedure.   She continues to have 2-3 bowel movements a day that are watery to loose in consistency. Due to persistent abdominal issue, she will have a colonoscopy on 09/12/2019 with Dr. Bonna Gains. She notes some improvement with metamucil. The lymphedema in her in her right arm is stable. The patient does not want to get the COVID-19 vaccine. She reports that she does not even get the flu vaccine. She examines her breast often and she messages her breast scar daily to keep it soft.   Patient agreed to a 6 month follow up versus a year.    Past Medical History:  Diagnosis Date  . Breast  cancer ([place]) 2014   Chemo/radiaiton- Rt.  . Breast cancer of upper-outer quadrant of right female breast [facility]) March 2014   T1c,N0,M0; BRCA neg. ER: 60%; PR: 70%, her 2 neu not over expressed.   . Cancer [facility]) 2014   right breast.right wide excision with sentinal node biopsy on 06/21/12  . Lymphedema 2015   Right Arm.  . Personal history of chemotherapy   . Personal history  of radiation therapy     Past Surgical History:  Procedure Laterality Date  . BREAST BIOPSY Right 2014   Positive  . BREAST BIOPSY Left 03/2019   MRI Biopsy, benign, cylindrical tissue marker clip   . BREAST BIOPSY Left 03/2019   MRI Biopsy, benign, Dumb-bell clip  . BREAST LUMPECTOMY Right 2014  . BREAST SURGERY Right 06/21/2012   right wide excision with sentinal node biopsy/axilary dissection.  . COLONOSCOPY WITH PROPOFOL N/A 10/01/2016   Procedure: COLONOSCOPY WITH PROPOFOL;  Surgeon: Robert Bellow, MD;  Location: [facility] ENDOSCOPY;  Service: Endoscopy;  Laterality: N/A;  . STRABISMUS SURGERY      Family History  Problem Relation Age of Onset  . Cancer Paternal Uncle        Lung  . Cancer Cousin        Breast   . Cancer Maternal Aunt        lung  . Diabetes Mother   . Hypertension Father   . Diabetes Father   . Breast cancer Neg Hx   . Ovarian cancer Neg Hx   . Colon cancer Neg Hx     Social History:  reports that she has never smoked. She has never used smokeless tobacco. She reports that she does not drink alcohol or use drugs.  She is going to the gym.She lives in [place]. She is losing weight on Weight Watchers.She works at the [employer]. 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 She lives in [place].  The patient is alone today.  Allergies:  Allergies  Allergen Reactions  . Tape     The patient showed a cutaneous reaction to Telfa applied beneath a Tegaderm dressing. Telfa appears to be the primary offensive material.    Current Medications: Current Outpatient Medications  Medication Sig Dispense Refill  . Multiple Vitamins-Calcium (VIACTIV MULTI-VITAMIN PO) Take 2 tablets by mouth daily.    . Probiotic Product (PROBIOTIC-10 PO) Take by mouth.     No current facility-administered medications for this visit.    Review of Systems  Constitutional: Negative.  Negative for chills,  diaphoresis, fever, malaise/fatigue and weight loss.       "Doing good".  HENT: Negative.  Negative for congestion, ear pain, hearing loss, nosebleeds, sinus pain and sore throat.   Eyes: Negative.  Negative for blurred vision, double vision, photophobia and pain.  Respiratory: Negative.  Negative for cough, hemoptysis, sputum production and shortness of breath.   Cardiovascular: Negative.  Negative for chest pain, palpitations, orthopnea, leg swelling and PND.  Gastrointestinal: Positive for diarrhea (2-3 bowel movements; watery; on metamucil). Negative for abdominal pain, blood in stool, constipation, heartburn, melena, nausea and vomiting.  Genitourinary: Negative.  Negative for dysuria, frequency, hematuria and urgency.       Last menstrual period was 07/27/2012. Menstrual period started back in 01/2019 and 02/2019, regular every month.  Musculoskeletal: Negative.  Negative for back pain, joint pain, myalgias and neck pain.       Lymphedema of right arm (stable).  Skin: Negative.  Negative for  itching and rash.  Neurological: Negative.  Negative for dizziness, tremors, sensory change, speech change, focal weakness, weakness and headaches.  Endo/Heme/Allergies: Does not bruise/bleed easily.       Post-menopausal.   Psychiatric/Behavioral: Negative.  Negative for depression and memory loss. The patient is not nervous/anxious and does not have insomnia.   All other systems reviewed and are negative.  Performance status (ECOG): 0  Vitals Blood pressure 118/90, pulse 63, temperature (!) 95.3 F (35.2 C), temperature source Tympanic, weight 155 lb 15.6 oz (70.8 kg), last menstrual period 07/27/2012, SpO2 100 %.   Physical Exam  Constitutional: She is oriented to person, place, and time and well-developed, well-nourished, and in no distress. No distress.  HENT:  Head: Normocephalic.  Mouth/Throat: Oropharynx is clear and moist. No oropharyngeal exudate.  Short dark curly hair. Mask.   Eyes: Pupils are equal, round, and reactive to light. Conjunctivae and EOM are normal. No scleral icterus.  Glasses.  Brown eyes.  Cardiovascular: Normal rate, regular rhythm and normal heart sounds.  No murmur heard. Pulmonary/Chest: Effort normal and breath sounds normal. No respiratory distress. She has no wheezes. She has no rales. She exhibits edema (left breast inferiorly). She exhibits no tenderness. Right breast exhibits skin change (scar tissue; fibrocystic changes). Right breast exhibits no inverted nipple, no mass, no nipple discharge and no tenderness. Left breast exhibits skin change (minor fibrocystic changes under breast). Left breast exhibits no inverted nipple, no mass, no nipple discharge and no tenderness.  Abdominal: Soft. Bowel sounds are normal. She exhibits no distension and no mass. There is no abdominal tenderness. There is no rebound and no guarding.  Musculoskeletal:        General: No tenderness or edema. Normal range of motion.     Cervical back: Normal range of motion and neck supple.  Lymphadenopathy:    She has no cervical adenopathy.    She has no axillary adenopathy.       Right: No supraclavicular adenopathy present.       Left: No supraclavicular adenopathy present.  Neurological: She is alert and oriented to person, place, and time.  Skin: Skin is warm and dry. She is not diaphoretic.  Psychiatric: Mood, memory, affect and judgment normal.  Nursing note and vitals reviewed.    No visits with results within 3 Day(s) from this visit.  Latest known visit with results is:  Office Visit on 01/21/2019  Component Date Value Ref Range Status  . WBC 02/07/2019 6.0  3.4 - 10.8 x10E3/uL Final  . RBC 02/07/2019 4.39  3.77 - 5.28 x10E6/uL Final  . Hemoglobin 02/07/2019 13.3  11.1 - 15.9 g/dL Final  . Hematocrit 02/07/2019 39.4  34.0 - 46.6 % Final  . MCV 02/07/2019 90  79 - 97 fL Final  . MCH 02/07/2019 30.3  26.6 - 33.0 pg Final  . MCHC 02/07/2019 33.8  31.5 -  35.7 g/dL Final  . RDW 02/07/2019 13.3  11.7 - 15.4 % Final  . Platelets 02/07/2019 242  150 - 450 x10E3/uL Final  . Glucose 02/07/2019 74  65 - 99 mg/dL Final  . BUN 02/07/2019 12  6 - 24 mg/dL Final  . Creatinine, Ser 02/07/2019 0.82  0.57 - 1.00 mg/dL Final  . GFR calc non Af Amer 02/07/2019 84  >59 mL/min/1.73 Final  . GFR calc Af Amer 02/07/2019 97  >59 mL/min/1.73 Final  . BUN/Creatinine Ratio 02/07/2019 15  9 - 23 Final  . Sodium 02/07/2019 140  134 - 144  mmol/L Final  . Potassium 02/07/2019 4.3  3.5 - 5.2 mmol/L Final  . Chloride 02/07/2019 104  96 - 106 mmol/L Final  . CO2 02/07/2019 22  20 - 29 mmol/L Final  . Calcium 02/07/2019 8.4* 8.7 - 10.2 mg/dL Final  . Total Protein 02/07/2019 6.8  6.0 - 8.5 g/dL Final  . Albumin 02/07/2019 4.4  3.8 - 4.8 g/dL Final  . Globulin, Total 02/07/2019 2.4  1.5 - 4.5 g/dL Final  . Albumin/Globulin Ratio 02/07/2019 1.8  1.2 - 2.2 Final  . Bilirubin Total 02/07/2019 0.2  0.0 - 1.2 mg/dL Final  . Alkaline Phosphatase 02/07/2019 65  39 - 117 IU/L Final  . AST 02/07/2019 16  0 - 40 IU/L Final  . ALT 02/07/2019 13  0 - 32 IU/L Final  . Cholesterol, Total 02/07/2019 187  100 - 199 mg/dL Final  . Triglycerides 02/07/2019 74  0 - 149 mg/dL Final  . HDL 02/07/2019 69  >39 mg/dL Final  . VLDL Cholesterol Cal 02/07/2019 14  5 - 40 mg/dL Final  . LDL Chol Calc (NIH) 02/07/2019 104* 0 - 99 mg/dL Final  . Chol/HDL Ratio 02/07/2019 2.7  0.0 - 4.4 ratio Final   Comment:                                   T. Chol/HDL Ratio                                             Men  Women                               1/2 Avg.Risk  3.4    3.3                                   Avg.Risk  5.0    4.4                                2X Avg.Risk  9.6    7.1                                3X Avg.Risk 23.4   11.0   . TSH 02/07/2019 2.110  0.450 - 4.500 uIU/mL Final  . Neisseria Gonorrhea 01/21/2019 Negative   Final  . Chlamydia 01/21/2019 Negative   Final  . Trichomonas  01/21/2019 Negative   Final  . Bacterial Vaginitis (gardnerella) 01/21/2019 Negative   Final  . Candida Vaginitis 01/21/2019 Negative   Final  . Candida Glabrata 01/21/2019 Negative   Final  . Comment 01/21/2019 Normal Reference Range Bacterial Vaginosis - Negative   Final  . Comment 01/21/2019 Normal Reference Range Candida Species - Negative   Final  . Comment 01/21/2019 Normal Reference Range Candida Galbrata - Negative   Final  . Comment 01/21/2019 Normal Reference Range Trichomonas - Negative   Final  . Comment 01/21/2019 Normal Reference [place] Chlamydia - Negative   Final  . Comment 01/21/2019 Normal Reference Range Neisseria Gonorrhea - Negative   Final  . SURGICAL  PATHOLOGY 01/24/2019    Final-Edited                   Value:SURGICAL PATHOLOGY CASE: [id number] PATIENT: [facility] Report  Clinical History: (cm)  FINAL MICROSCOPIC DIAGNOSIS:  A. ENDOMETRIUM, BIOPSY: - Endometrioid type polyp(s). - There is no evidence of hyperplasia or malignancy.  GROSS DESCRIPTION:  Received in formalin is a 1.4 x 1 x 0.3 cm aggregate of dark red soft tissue and mucoid material.  Submitted 1 block.  (SSW 10/19)  Final Diagnosis performed by Enid Cutter, MD.   Electronically signed 01/25/2019 Technical component performed at [facility]. [facility], [place] [address] [place], [place] [address].  Professional component performed at [facility], [place] [address] [place], [place] [address].  Immunohistochemistry Technical component (if applicable) was performed at [facility]. [address], [place], [place], [place] [address].   IMMUNOHISTOCHEMISTRY DISCLAIMER (if applicable): Some of these immunohistochemical stains m                         ay have been developed and the performance characteristics determine by [facility]. Some may not have been cleared or approved by the U.S. Food and Drug Administration.  The FDA has determined that such clearance or approval is not necessary. This test is used for clinical purposes. It should not be regarded as investigational or for research. This laboratory is certified under the [place] (CLIA-88) as qualified to perform high complexity clinical laboratory testing.  The controls stained appropriately.   . CA 27.29 02/07/2019 23.2  0.0 - 38.6 U/mL Final   Comment: Siemens Centaur Immunochemiluminometric Methodology [facility]) Values obtained with different assay methods or kits cannot be used interchangeably. Results cannot be interpreted as absolute evidence of the presence or absence of malignant disease.    Assessment:  Talani Brazee is a 50 y.o. female with stage IA right breast invasive mammary carcinomastatus post wide excision and axillary lymph node dissection on 06/21/2012. Pathology revealed a 1.7 cm grade III ductal carcinoma. Thirteen lymph nodes were negative. Tumor was ER/PR positive and HER-2/neu negative. BRCA 1 and 2 testing was negative. Oncotype DXwas 29 (high) which translates into a distant recurrence of 19% at 10 yrs with tamoxifen alone.  The patient received 4 cycles ofAdriamycin and Cytoxan(08/17/2012 - 10/19/2012) followed by 12 weeks of Taxol(11/19/2012 - 01/25/2013). She completed local radiationon 04/20/2013. Shewas onTamoxifenfrom01/15/2015 - 04/21/2018.   CA27.29has been followed: 17 on 01/12/2015, 18.6 on 07/27/2015, 14.9 on 01/28/2016, 13.9 on 08/21/2016, 15.1 on 02/03/2017, 17.8 on 08/17/2017, 13.3 on 02/04/2018, and 11.2 on 08/12/2018.  Bilateral mammogramon 08/11/2016 revealed no evidence of malignancy.Bilateral breast MRIon 04/09/2017 demonstrated no evidence of malignancy. Bilateral mammogramon 08/13/2017 revealing no mammographic evidence of malignancy in either breast. There were stable post lumpectomy changes noted in the RIGHT breast, including a far posterior  seroma.   Bilateral screening mammogram on 08/18/2018 revealed no evidence of malignancy. Bilateral breast MRI on 08/19/2018 revealed an indeterminate irregular enhancing mass in the upper inner left breast and indeterminate clumped non mass enhancement within the anteromedial left breast. Bilateral breast MRI on 03/24/2019 revealed 2 sites of enhancement in the medial left breast that were felt to represent more fat necrosis, similar in appearance to the 08/19/2018 and 09/06/2018 MRI findings. These regions of enhancement largely correlated with fat on the T1 images. There was no MRI evidence of malignancy in the right  breast or elsewhere in the left breast.  Bilateral diagnostic mammogram and ultrasound on 08/19/2019 revealed no mammographic or sonographic evidence of malignancy or other abnormal finding at the small area of discoloration in the lateral  right breast.  There was no mammographic evidence of malignancy in the left breast.  Left breast biopsy on 09/06/2018 revealed fat necrosis with no evidence of malignancy in both locations.   Hormone levelson 08/21/2017 revealed an estradiol level of 22.8 pg/mL and an FSH of 34.9 mIU/mL, both of which are consistent with a post-menopausal state.   Breast cancer index (BCI)on 09/07/2017 demonstrated a a high risk of recurrence at 8.3% (3.3% - 13.1%), but a low likelihood of benefit from extended endocrine therapy.  The patient does not want to get the COVID-19 vaccine.  Symptomatically, she feels good.  Menses has returned.  She denies any breast concerns.  She has loose stools.  Colonoscopy is planned.  Plan: 1.   Labs today: CBC with diff, CMP, CA 27.29. 2.  Stage IA right breastcancer              Clinically, she is doing well.             She denies any breast concerns.             Breast MRI from 03/24/2019 revealed 2 sites of enhancement in the medial left breast that were felt to represent more fat necrosis.   Images were similar  in appearance to the 08/19/2018 and 09/06/2018 MRI findings.             Follow-up breast MRI in 6 months.             She completed 5 years of endocrine therapy.             She declined extended adjuvant endocrine therapy.             Continue yearly mammogram. 3.   Bilateral breast MRI 02/20/2020. 4.   RTC in 6 months for MD assessment, labs (CBC with diff, CMP, CA27.29), and review of breast MRI.  I discussed the assessment and treatment plan with the patient.  The patient was provided an opportunity to ask questions and all were answered.  The patient agreed with the plan and demonstrated an understanding of the instructions.  The patient was advised to call back if the symptoms worsen or if the condition fails to improve as anticipated.  I provided 27 minutes of face-to-face time during this encounter and > 50% was spent counseling as documented under my assessment and plan.  An additional 10 minutes were spent reviewing her chart (Epic and Care Everywhere) including notes, labs, and imaging studies. I provided these services from the [facility] office.   Lequita Asal, MD, PhD    08/22/2019, 4:03 PM  I, Selena Batten, am acting as scribe for [employer]. Mike Gip, MD, PhD.  I, Melissa C. Mike Gip, MD, have reviewed the above documentation for accuracy and completeness, and I agree with the above.

## 2019-08-19 ENCOUNTER — Other Ambulatory Visit: Payer: Self-pay | Admitting: Hematology and Oncology

## 2019-08-19 ENCOUNTER — Ambulatory Visit
Admission: RE | Admit: 2019-08-19 | Discharge: 2019-08-19 | Disposition: A | Discharge status: Home / Self Care | Payer: No Typology Code available for payment source | Source: Ambulatory Visit

## 2019-08-19 ENCOUNTER — Ambulatory Visit
Admission: RE | Admit: 2019-08-19 | Discharge: 2019-08-19 | Disposition: A | Discharge status: Home / Self Care | Payer: No Typology Code available for payment source | Source: Ambulatory Visit | Attending: Hematology and Oncology | Admitting: Hematology and Oncology

## 2019-08-19 ENCOUNTER — Other Ambulatory Visit: Payer: Self-pay

## 2019-08-19 DIAGNOSIS — Z853 Personal history of malignant neoplasm of breast: Secondary | ICD-10-CM

## 2019-08-19 DIAGNOSIS — N63 Unspecified lump in unspecified breast: Secondary | ICD-10-CM

## 2019-08-19 DIAGNOSIS — R928 Other abnormal and inconclusive findings on diagnostic imaging of breast: Secondary | ICD-10-CM | POA: Diagnosis present

## 2019-08-19 DIAGNOSIS — N6311 Unspecified lump in the right breast, upper outer quadrant: Secondary | ICD-10-CM

## 2019-08-22 ENCOUNTER — Inpatient Hospital Stay (HOSPITAL_BASED_OUTPATIENT_CLINIC_OR_DEPARTMENT_OTHER): Payer: No Typology Code available for payment source | Admitting: Hematology and Oncology

## 2019-08-22 ENCOUNTER — Encounter: Payer: Self-pay | Admitting: Hematology and Oncology

## 2019-08-22 ENCOUNTER — Other Ambulatory Visit: Payer: Self-pay

## 2019-08-22 ENCOUNTER — Inpatient Hospital Stay: Payer: No Typology Code available for payment source | Attending: Hematology and Oncology

## 2019-08-22 VITALS — BP 118/90 | HR 63 | Temp 95.3°F | Wt 156.0 lb

## 2019-08-22 DIAGNOSIS — Z853 Personal history of malignant neoplasm of breast: Secondary | ICD-10-CM | POA: Diagnosis not present

## 2019-08-22 DIAGNOSIS — R928 Other abnormal and inconclusive findings on diagnostic imaging of breast: Secondary | ICD-10-CM

## 2019-08-22 DIAGNOSIS — Z170 Estrogen receptor positive status [ER+]: Secondary | ICD-10-CM | POA: Insufficient documentation

## 2019-08-22 DIAGNOSIS — C50911 Malignant neoplasm of unspecified site of right female breast: Secondary | ICD-10-CM | POA: Insufficient documentation

## 2019-08-22 LAB — CBC WITH DIFFERENTIAL/PLATELET
Abs Immature Granulocytes: 0.01 10*3/uL (ref 0.00–0.07)
Basophils Absolute: 0 10*3/uL (ref 0.0–0.1)
Basophils Relative: 0 %
Eosinophils Absolute: 0.1 10*3/uL (ref 0.0–0.5)
Eosinophils Relative: 2 %
HCT: 38.9 % (ref 36.0–46.0)
Hemoglobin: 12.7 g/dL (ref 12.0–15.0)
Immature Granulocytes: 0 %
Lymphocytes Relative: 34 %
Lymphs Abs: 1.8 10*3/uL (ref 0.7–4.0)
MCH: 28.7 pg (ref 26.0–34.0)
MCHC: 32.6 g/dL (ref 30.0–36.0)
MCV: 88 fL (ref 80.0–100.0)
Monocytes Absolute: 0.4 10*3/uL (ref 0.1–1.0)
Monocytes Relative: 8 %
Neutro Abs: 3.1 10*3/uL (ref 1.7–7.7)
Neutrophils Relative %: 56 %
Platelets: 218 10*3/uL (ref 150–400)
RBC: 4.42 MIL/uL (ref 3.87–5.11)
RDW: 13.3 % (ref 11.5–15.5)
WBC: 5.5 10*3/uL (ref 4.0–10.5)
nRBC: 0 % (ref 0.0–0.2)

## 2019-08-22 LAB — COMPREHENSIVE METABOLIC PANEL
ALT: 19 U/L (ref 0–44)
AST: 21 U/L (ref 15–41)
Albumin: 3.7 g/dL (ref 3.5–5.0)
Alkaline Phosphatase: 61 U/L (ref 38–126)
Anion gap: 9 (ref 5–15)
BUN: 15 mg/dL (ref 6–20)
CO2: 25 mmol/L (ref 22–32)
Calcium: 9 mg/dL (ref 8.9–10.3)
Chloride: 104 mmol/L (ref 98–111)
Creatinine, Ser: 0.84 mg/dL (ref 0.44–1.00)
GFR calc Af Amer: >60 mL/min (ref >60)
GFR calc non Af Amer: >60 mL/min (ref >60)
Glucose, Bld: 83 mg/dL (ref 70–99)
Potassium: 3.9 mmol/L (ref 3.5–5.1)
Sodium: 138 mmol/L (ref 135–145)
Total Bilirubin: 0.3 mg/dL (ref 0.3–1.2)
Total Protein: 7.6 g/dL (ref 6.5–8.1)

## 2019-08-22 NOTE — Progress Notes (Signed)
Pt in for follow up, reports had mammogram.  Pt states "she had not had a period since 2014 but resumed in Oct or November of 2020 and has every month regularly".

## 2019-08-23 ENCOUNTER — Ambulatory Visit: Payer: No Typology Code available for payment source

## 2019-08-23 LAB — CANCER ANTIGEN 27.29: CA 27.29: 17.3 U/mL (ref 0.0–38.6)

## 2019-08-26 ENCOUNTER — Encounter: Payer: No Typology Code available for payment source | Admitting: Certified Nurse Midwife

## 2019-08-29 ENCOUNTER — Encounter: Payer: Self-pay | Admitting: Certified Nurse Midwife

## 2019-08-29 ENCOUNTER — Other Ambulatory Visit: Payer: Self-pay

## 2019-08-29 ENCOUNTER — Ambulatory Visit (INDEPENDENT_AMBULATORY_CARE_PROVIDER_SITE_OTHER): Payer: No Typology Code available for payment source | Admitting: Certified Nurse Midwife

## 2019-08-29 VITALS — BP 130/95 | HR 67 | Ht 61.0 in | Wt 154.8 lb

## 2019-08-29 DIAGNOSIS — N83202 Unspecified ovarian cyst, left side: Secondary | ICD-10-CM | POA: Diagnosis not present

## 2019-08-29 DIAGNOSIS — D251 Intramural leiomyoma of uterus: Secondary | ICD-10-CM

## 2019-08-29 DIAGNOSIS — N950 Postmenopausal bleeding: Secondary | ICD-10-CM | POA: Diagnosis not present

## 2019-08-29 DIAGNOSIS — R9389 Abnormal findings on diagnostic imaging of other specified body structures: Secondary | ICD-10-CM

## 2019-08-29 NOTE — Progress Notes (Signed)
GYN ENCOUNTER NOTE  Subjective:       Natalie Gallagher is a 49 y.o. G0P0 female here for results review.   Patient seen on 08/11/2019 for evaluation of PMB, thicken endometrial lining, and ovarian cyst.   Reports spotting since last visit. Denies difficulty breathing or respiratory distress, chest pain, abdominal pain, excessive vaginal bleeding, dysuria, and leg pain or swelling.    Gynecologic History  Patient's last menstrual period was 07/27/2012 (exact date).  Contraception: post menopausal status  Last Pap: 01/2017. Results were: Neg/Neg  Last MRI: 03/2019. Results were: BI-RADS 3  Obstetric History  OB History  Gravida Para Term Preterm AB Living  0            SAB TAB Ectopic Multiple Live Births             Obstetric Comments  Age first period 7    Past Medical History:  Diagnosis Date  . Breast cancer ([place]) 2014   Chemo/radiaiton- Rt.  . Breast cancer of upper-outer quadrant of right female breast [facility]) March 2014   T1c,N0,M0; BRCA neg. ER: 60%; PR: 70%, her 2 neu not over expressed.   . Cancer [facility]) 2014   right breast.right wide excision with sentinal node biopsy on 06/21/12  . Lymphedema 2015   Right Arm.  . Personal history of chemotherapy   . Personal history of radiation therapy     Past Surgical History:  Procedure Laterality Date  . BREAST BIOPSY Right 2014   Positive  . BREAST BIOPSY Left 03/2019   MRI Biopsy, benign, cylindrical tissue marker clip   . BREAST BIOPSY Left 03/2019   MRI Biopsy, benign, Dumb-bell clip  . BREAST LUMPECTOMY Right 2014  . BREAST SURGERY Right 06/21/2012   right wide excision with sentinal node biopsy/axilary dissection.  . COLONOSCOPY WITH PROPOFOL N/A 10/01/2016   Procedure: COLONOSCOPY WITH PROPOFOL;  Surgeon: Robert Bellow, MD;  Location: [facility] ENDOSCOPY;  Service: Endoscopy;  Laterality: N/A;  . STRABISMUS SURGERY      Current Outpatient Medications on File Prior to Visit  Medication Sig Dispense Refill  .  Multiple Vitamins-Calcium (VIACTIV MULTI-VITAMIN PO) Take 2 tablets by mouth daily.    . Probiotic Product (PROBIOTIC-10 PO) Take by mouth.     No current facility-administered medications on file prior to visit.    Allergies  Allergen Reactions  . Tape     The patient showed a cutaneous reaction to Telfa applied beneath a Tegaderm dressing. Telfa appears to be the primary offensive material.    Social History   Socioeconomic History  . Marital status: Divorced    Spouse name: Not on file  . Number of children: 0  . Years of education: Not on file  . Highest education level: Not on file  Occupational History  . Not on file  Tobacco Use  . Smoking status: Never Smoker  . Smokeless tobacco: Never Used  Substance and Sexual Activity  . Alcohol use: No  . Drug use: No  . Sexual activity: Not Currently    Birth control/protection: None  Other Topics Concern  . Not on file  Social History Narrative  . Not on file   Social Determinants of Health   Financial Resource Strain:   . Difficulty of Paying Living Expenses:   Food Insecurity:   . Worried About Charity fundraiser in the Last Year:   . Arboriculturist in the Last Year:   Transportation Needs:   . Lack  of Transportation (Medical):   Marland Kitchen Lack of Transportation (Non-Medical):   Physical Activity:   . Days of Exercise per Week:   . Minutes of Exercise per Session:   Stress:   . Feeling of Stress :   Social Connections:   . Frequency of Communication with Friends and Family:   . Frequency of Social Gatherings with Friends and Family:   . Attends Religious Services:   . Active Member of Clubs or Organizations:   . Attends Archivist Meetings:   Marland Kitchen Marital Status:   Intimate Partner Violence:   . Fear of Current or Ex-Partner:   . Emotionally Abused:   Marland Kitchen Physically Abused:   . Sexually Abused:     Family History  Problem Relation Age of Onset  . Cancer Paternal Uncle        Lung  . Cancer Cousin         Breast   . Cancer Maternal Aunt        lung  . Diabetes Mother   . Hypertension Father   . Diabetes Father   . Breast cancer Neg Hx   . Ovarian cancer Neg Hx   . Colon cancer Neg Hx     The following portions of the patient's history were reviewed and updated as appropriate: allergies, current medications, past family history, past medical history, past social history, past surgical history and problem list.  Review of Systems  ROS negative except as noted above. Information obtained from patient.   Objective:   BP (!) 130/95   Pulse 67   Ht '5\' 1"'$  (1.549 m)   Wt 154 lb 12.8 oz (70.2 kg)   LMP 07/27/2012 (Exact Date)   BMI 29.25 kg/m    CONSTITUTIONAL: Well-developed, well-nourished female in no acute distress.   PHYSICAL EXAM: Not indicated  Recent Results (from the past 2160 hour(s))  HIV Antibody (routine testing w rflx)     Status: None   Collection Time: 08/11/19  9:14 AM  Result Value Ref Range   HIV Screen 4th Generation wRfx Non Reactive Non Reactive  Progesterone     Status: None   Collection Time: 08/11/19 10:19 AM  Result Value Ref Range   Progesterone 0.5 ng/mL    Comment:                      Follicular phase       0.1 -   0.9                      Luteal phase           1.8 -  23.9                      Ovulation phase        0.1 -  12.0                      Pregnant                         First trimester    11.0 -  44.3                         Second trimester   25.4 -  83.3  Third trimester    58.7 - 214.0                      Postmenopausal         0.0 -   0.1   Estradiol     Status: None   Collection Time: 08/11/19 10:19 AM  Result Value Ref Range   Estradiol 86.9 pg/mL    Comment:                     Adult Female:                       Follicular phase   16.1 -   166.0                       Ovulation phase    85.8 -   498.0                       Luteal phase       43.8 -   211.0                       Postmenopausal      <6.0 -    54.7                     Pregnancy                       1st trimester     215.0 - >4300.0 Roche ECLIA methodology   FSH/LH     Status: None   Collection Time: 08/11/19 10:19 AM  Result Value Ref Range   LH 66.3 mIU/mL    Comment:                     Adult Female:                       Follicular phase      2.4 -  12.6                       Ovulation phase      14.0 -  95.6                       Luteal phase          1.0 -  11.4                       Postmenopausal        7.7 -  58.5    FSH 68.7 mIU/mL    Comment:                     Adult Female:                       Follicular phase      3.5 -  12.5                       Ovulation phase       4.7 -  21.5                       Luteal phase  1.7 -   7.7                       Postmenopausal       25.8 - 134.8   Surgical pathology     Status: None   Collection Time: 08/11/19  5:14 PM  Result Value Ref Range   SURGICAL PATHOLOGY      SURGICAL PATHOLOGY CASE: [id number] PATIENT: Natalie Gallagher Surgical Pathology Report     Clinical History: Endometrial thickening on ultrasound (nt)     FINAL MICROSCOPIC DIAGNOSIS:  A. ENDOMETRIUM, BIOPSY: - Scant benign inactive endometrium - Benign cervical glandular mucosa - Negative for hyperplasia or malignancy    GROSS DESCRIPTION:  Received in formalin are tan, hemorrhagic soft tissue fragments that are entirely submitted. Volume: 1.8 x 1.2 x 0.1 cm. (1 B) (AK 08/12/2019)     Final Diagnosis performed by Jaquita Folds, MD.   Electronically signed 08/15/2019 Technical component performed at [facility]. [facility], [place] [address], [place], [place] [address].  Professional component performed at [facility], [place] [address], [place], [place] [address].  Immunohistochemistry Technical component (if applicable) was performed at [facility]. [address], [place], [place], [place] [address].   IMMUN  OHISTOCHEMISTRY DISCLAIMER (if applicable): Some of these immunohistochemical stains may have been developed and the performance characteristics determine by [facility]. Some may not have been cleared or approved by the U.S. Food and Drug Administration. The FDA has determined that such clearance or approval is not necessary. This test is used for clinical purposes. It should not be regarded as investigational or for research. This laboratory is certified under the [place] (CLIA-88) as qualified to perform high complexity clinical laboratory testing.  The controls stained appropriately.    ULTRASOUND REPORT  Location: [facility] Date of Service: 08/11/2019   TECHNIQUE: Both transabdominal and transvaginal ultrasound examinations of the pelvis were performed. Transabdominal technique was performed for global imaging of the pelvis including uterus, ovaries, adnexal regions, and pelvic cul-de-sac. It was necessary to proceed with endovaginal exam following the transabdominal exam to visualize the endometrium and adnexa.  Indications:AUB   Findings:  The uterus is anteverted and measures 7.5 x 3.6 x 4.6 cm. Echo texture is heterogenous with evidence of focal masses. Within the uterus are multiple suspected fibroids measuring: Fibroid 1:Anterior Fundal IM 2.8x 2.7x 2.9 cm Fibroid 2:Pedunculated fundal 2.83x 2.0 x 1.9  The Endometrium measures 12.3 mm.  Right Ovary measures 2.2 x 1.1 x 2.1 cm. It is normal in appearance. Left Ovary measures 2.2 x 1.4 x 1.8 cm. It is normal in appearance. Left hypoechoic lesion with septations, measuring 1.3 x 1.1 x 1.2 cm. Survey of the adnexa demonstrates no adnexal masses. There is no free fluid in the cul de sac.  Impression: 1. Multiple fibroids seen same as on prior exam. 2. Left septate cyst as described above. 3. Endometrium is measuring upper limites at 12.3 cm. 4.  Nabothian cyst seen with in the cervix.  Recommendations: 1.Clinical correlation with the patient's History and Physical Exam.  Assessment:   1. Postmenopausal bleeding   2. Intramural leiomyoma of uterus   3. Endometrial thickening on ultrasound   4. Left ovarian cyst    Plan:   Results reviewed with patient, verbalized understanding.   Labs today, see orders.   Management options discussed including follow up in six (6) months, trial of progesterone or  hysterectomy; patient wishes to follow up in six (6) months at this time.   Reviewed red flag symptoms and when to call.   RTC x 6 months for follow up or sooner if needed.    Diona Fanti, CNM [facility], [facility] 08/29/19 11:00 AM

## 2019-08-29 NOTE — Progress Notes (Signed)
Pt present to discuss lab results. Pt stated that she was doing well no problems.

## 2019-08-29 NOTE — Patient Instructions (Signed)
Ovarian Cyst An ovarian cyst is a fluid-filled sac on an ovary. The ovaries are organs that make eggs in women. Most ovarian cysts go away on their own and are not cancerous (are benign). Some cysts need treatment. Follow these instructions at home:  Take over-the-counter and prescription medicines only as told by your doctor.  Do not drive or use heavy machinery while taking prescription pain medicine.  Get pelvic exams and Pap tests as often as told by your doctor.  Return to your normal activities as told by your doctor. Ask your doctor what activities are safe for you.  Do not use any products that contain nicotine or tobacco, such as cigarettes and e-cigarettes. If you need help quitting, ask your doctor.  Keep all follow-up visits as told by your doctor. This is important. Contact a doctor if:  Your periods are: ? Late. ? Irregular. ? Painful.   Your periods stop.  You have pelvic pain that does not go away.  You have pressure on your bladder.  You have trouble making your bladder empty when you pee (urinate).  You have pain during sex.  You have any of the following in your belly (abdomen): ? A feeling of fullness. ? Pressure. ? Discomfort. ? Pain that does not go away. ? Swelling.  You feel sick most of the time.  You have trouble pooping (have constipation).  You are not as hungry as usual (you lose your appetite).  You get very bad acne.  You start to have more hair on your body and face.  You are gaining weight or losing weight without changing your exercise and eating habits.  You think you may be pregnant. Get help right away if:  You have belly pain that is very bad or gets worse.  You cannot eat or drink without throwing up (vomiting).  You suddenly get a fever.  Your period is a lot heavier than usual. This information is not intended to replace advice given to you by your health care provider. Make sure you discuss any questions you have  with your health care provider. Document Revised: 03/06/2017 Document Reviewed: 08/26/2015 Elsevier Patient Education  2020 [place].   Postmenopausal Bleeding  Postmenopausal bleeding is any bleeding that occurs after menopause. Menopause is when a woman's period stops. Any type of bleeding after menopause should be checked by your doctor. Treatment will depend on the cause. Follow these instructions at home:  Pay attention to any changes in your symptoms.  Avoid using tampons and douches as told by your doctor.  Change your pads regularly.  Get regular pelvic exams and Pap tests.  Take iron pills as told by your doctor.  Take over-the-counter and prescription medicines only as told by your doctor.  Keep all follow-up visits as told by your doctor. This is important. Contact a doctor if:  Your bleeding lasts for more than 1 week.  You have pain in your belly (abdomen).  You have bleeding during or after sex.  You have bleeding that happens more often than every 3 weeks. Get help right away if:  You have fever, chills, headache, dizziness, muscle aches, or bleeding.  You have very bad pain with bleeding.  You have clumps of blood (blood clots) coming from your vagina.  You have a lot of bleeding, and: ? You use more than 1 pad an hour. ? This kind of bleeding has never happened before.  You feel like you are going to pass out (faint).  Summary  Any type of bleeding after menopause should be checked by your doctor.  Pay attention to any changes in your symptoms.  Keep all follow-up visits as told by your doctor. This information is not intended to replace advice given to you by your health care provider. Make sure you discuss any questions you have with your health care provider. Document Revised: 06/10/2018 Document Reviewed: 04/29/2016 Elsevier Patient Education  2020 [employer].

## 2019-08-30 LAB — OVARIAN MALIGNANCY RISK-ROMA
Cancer Antigen (CA) 125: 18.2 U/mL (ref 0.0–38.1)
HE4: 68.3 pmol/L — ABNORMAL HIGH (ref 0.0–63.6)
Postmenopausal ROMA: 1.72
Premenopausal ROMA: 1.46 — ABNORMAL HIGH

## 2019-08-30 LAB — POSTMENOPAUSAL INTERP: LOW

## 2019-08-30 LAB — BETA HCG QUANT (REF LAB): hCG Quant: <1 m[IU]/mL

## 2019-08-30 LAB — PREMENOPAUSAL INTERP: HIGH

## 2019-09-01 ENCOUNTER — Other Ambulatory Visit: Payer: Self-pay

## 2019-09-01 DIAGNOSIS — R197 Diarrhea, unspecified: Secondary | ICD-10-CM

## 2019-09-01 MED ORDER — NA SULFATE-K SULFATE-MG SULF 17.5-3.13-1.6 GM/177ML PO SOLN: 1.0000 | Freq: Once | ORAL | 0 refills | Status: AC

## 2019-09-02 ENCOUNTER — Other Ambulatory Visit: Payer: Self-pay

## 2019-09-02 ENCOUNTER — Other Ambulatory Visit
Admission: RE | Admit: 2019-09-02 | Discharge: 2019-09-02 | Disposition: A | Discharge status: Home / Self Care | Payer: No Typology Code available for payment source | Source: Ambulatory Visit | Attending: Gastroenterology | Admitting: Gastroenterology

## 2019-09-02 DIAGNOSIS — Z01812 Encounter for preprocedural laboratory examination: Secondary | ICD-10-CM | POA: Diagnosis not present

## 2019-09-02 DIAGNOSIS — Z20822 Contact with and (suspected) exposure to covid-19: Secondary | ICD-10-CM | POA: Insufficient documentation

## 2019-09-02 LAB — SARS CORONAVIRUS 2 (TAT 6-24 HRS): SARS Coronavirus 2: NEGATIVE

## 2019-09-07 ENCOUNTER — Ambulatory Visit: Payer: No Typology Code available for payment source | Admitting: Anesthesiology

## 2019-09-07 ENCOUNTER — Encounter
Admission: RE | Disposition: A | Discharge status: Home / Self Care | Payer: Self-pay | Source: Ambulatory Visit | Attending: Gastroenterology

## 2019-09-07 ENCOUNTER — Encounter: Payer: Self-pay | Admitting: Gastroenterology

## 2019-09-07 ENCOUNTER — Other Ambulatory Visit: Payer: Self-pay | Admitting: General Surgery

## 2019-09-07 ENCOUNTER — Other Ambulatory Visit: Payer: Self-pay

## 2019-09-07 ENCOUNTER — Ambulatory Visit
Admission: RE | Admit: 2019-09-07 | Discharge: 2019-09-07 | Disposition: A | Discharge status: Home / Self Care | Payer: No Typology Code available for payment source | Source: Ambulatory Visit | Attending: Gastroenterology | Admitting: Gastroenterology

## 2019-09-07 DIAGNOSIS — Z803 Family history of malignant neoplasm of breast: Secondary | ICD-10-CM | POA: Diagnosis not present

## 2019-09-07 DIAGNOSIS — Z801 Family history of malignant neoplasm of trachea, bronchus and lung: Secondary | ICD-10-CM | POA: Insufficient documentation

## 2019-09-07 DIAGNOSIS — Z9221 Personal history of antineoplastic chemotherapy: Secondary | ICD-10-CM | POA: Insufficient documentation

## 2019-09-07 DIAGNOSIS — K52832 Lymphocytic colitis: Secondary | ICD-10-CM | POA: Diagnosis not present

## 2019-09-07 DIAGNOSIS — K639 Disease of intestine, unspecified: Secondary | ICD-10-CM

## 2019-09-07 DIAGNOSIS — Z8249 Family history of ischemic heart disease and other diseases of the circulatory system: Secondary | ICD-10-CM | POA: Insufficient documentation

## 2019-09-07 DIAGNOSIS — R197 Diarrhea, unspecified: Secondary | ICD-10-CM

## 2019-09-07 DIAGNOSIS — Z833 Family history of diabetes mellitus: Secondary | ICD-10-CM | POA: Insufficient documentation

## 2019-09-07 DIAGNOSIS — Z853 Personal history of malignant neoplasm of breast: Secondary | ICD-10-CM | POA: Diagnosis not present

## 2019-09-07 DIAGNOSIS — Z79899 Other long term (current) drug therapy: Secondary | ICD-10-CM | POA: Diagnosis not present

## 2019-09-07 DIAGNOSIS — Z923 Personal history of irradiation: Secondary | ICD-10-CM | POA: Diagnosis not present

## 2019-09-07 HISTORY — PX: COLONOSCOPY WITH PROPOFOL: SHX5780

## 2019-09-07 LAB — POCT PREGNANCY, URINE: Preg Test, Ur: NEGATIVE

## 2019-09-07 SURGERY — COLONOSCOPY WITH PROPOFOL
Anesthesia: General

## 2019-09-07 MED ORDER — PROPOFOL 500 MG/50ML IV EMUL
INTRAVENOUS | Status: AC
  Filled 2019-09-07: qty 50

## 2019-09-07 MED ORDER — GLYCOPYRROLATE 0.2 MG/ML IJ SOLN
INTRAMUSCULAR | Status: DC | PRN
  Administered 2019-09-07: .2 mg via INTRAVENOUS

## 2019-09-07 MED ORDER — PROPOFOL 10 MG/ML IV BOLUS
INTRAVENOUS | Status: DC | PRN
  Administered 2019-09-07: 50 mg via INTRAVENOUS
  Administered 2019-09-07: 20 mg via INTRAVENOUS

## 2019-09-07 MED ORDER — PROPOFOL 500 MG/50ML IV EMUL
INTRAVENOUS | Status: DC | PRN
  Administered 2019-09-07: 150 ug/kg/min via INTRAVENOUS

## 2019-09-07 MED ORDER — LIDOCAINE HCL (CARDIAC) PF 100 MG/5ML IV SOSY
PREFILLED_SYRINGE | INTRAVENOUS | Status: DC | PRN
  Administered 2019-09-07: 100 mg via INTRAVENOUS

## 2019-09-07 MED ORDER — GLYCOPYRROLATE 0.2 MG/ML IJ SOLN
INTRAMUSCULAR | Status: AC
  Filled 2019-09-07: qty 2

## 2019-09-07 MED ORDER — PROPOFOL 10 MG/ML IV BOLUS
INTRAVENOUS | Status: AC
  Filled 2019-09-07: qty 20

## 2019-09-07 MED ORDER — SODIUM CHLORIDE 0.9 % IV SOLN: INTRAVENOUS | Status: DC

## 2019-09-07 NOTE — Anesthesia Preprocedure Evaluation (Signed)
Anesthesia Evaluation  Patient identified by MRN, date of birth, ID band Patient awake    Reviewed: Allergy & Precautions, NPO status , Patient's Chart, lab work & pertinent test results  History of Anesthesia Complications Negative for: history of anesthetic complications  Airway Mallampati: II  TM Distance: >3 FB Neck ROM: Full    Dental no notable dental hx. (+) Teeth Intact   Pulmonary neg pulmonary ROS, neg sleep apnea, neg COPD,    breath sounds clear to auscultation- rhonchi (-) wheezing      Cardiovascular Exercise Tolerance: Good (-) hypertension(-) CAD and (-) Past MI  Rhythm:Regular Rate:Normal - Systolic murmurs and - Diastolic murmurs    Neuro/Psych negative neurological ROS  negative psych ROS   GI/Hepatic negative GI ROS, Neg liver ROS,   Endo/Other  negative endocrine ROSneg diabetes  Renal/GU negative Renal ROS     Musculoskeletal negative musculoskeletal ROS (+)   Abdominal (+) - obese,   Peds  Hematology negative hematology ROS (+)   Anesthesia Other Findings Past Medical History: 2014: Breast cancer ([place])     Comment: Chemo/radiaiton- Rt. March 2014: Breast cancer of upper-outer quadrant of right*     Comment: T1c,N0,M0; BRCA neg. ER: 60%; PR: 70%, her 2               neu not over expressed.  2014: Cancer [facility])     Comment: right breast.right wide excision with sentinal              node biopsy on 06/21/12 2015: Lymphedema     Comment: Right Arm. No date: Personal history of chemotherapy No date: Personal history of radiation therapy   Reproductive/Obstetrics                             Anesthesia Physical  Anesthesia Plan  ASA: II  Anesthesia Plan: General   Post-op Pain Management:    Induction: Intravenous  PONV Risk Score and Plan: 3 and Propofol, Ondansetron and Propofol infusion  Airway Management Planned: Natural Airway  Additional Equipment:  None  Intra-op Plan:   Post-operative Plan:   Informed Consent: I have reviewed the patients History and Physical, chart, labs and discussed the procedure including the risks, benefits and alternatives for the proposed anesthesia with the patient or authorized representative who has indicated his/her understanding and acceptance.     Dental advisory given  Plan Discussed with: CRNA and Anesthesiologist  Anesthesia Plan Comments: (Discussed risks of anesthesia with patient, including possibility of difficulty with spontaneous ventilation under anesthesia necessitating airway intervention, PONV, and rare risks such as cardiac or respiratory or neurological events. Patient understands.)        Anesthesia Quick Evaluation

## 2019-09-07 NOTE — H&P (Signed)
Natalie Antigua, MD [address] [place], [place], [place], [address] [address], [place], [place], [place], [address] Phone: [phone]  Fax: [phone]  Primary Care Physician:  Glean Hess, MD   Pre-Procedure History & Physical: HPI:  Natalie Gallagher is a 49 y.o. female is here for a colonoscopy.   Past Medical History:  Diagnosis Date  . Breast cancer ([place]) 2014   Chemo/radiaiton- Rt.  . Breast cancer of upper-outer quadrant of right female breast [facility]) March 2014   T1c,N0,M0; BRCA neg. ER: 60%; PR: 70%, her 2 neu not over expressed.   . Cancer [facility]) 2014   right breast.right wide excision with sentinal node biopsy on 06/21/12  . Lymphedema 2015   Right Arm.  . Personal history of chemotherapy   . Personal history of radiation therapy     Past Surgical History:  Procedure Laterality Date  . BREAST BIOPSY Right 2014   Positive  . BREAST BIOPSY Left 03/2019   MRI Biopsy, benign, cylindrical tissue marker clip   . BREAST BIOPSY Left 03/2019   MRI Biopsy, benign, Dumb-bell clip  . BREAST LUMPECTOMY Right 2014  . BREAST SURGERY Right 06/21/2012   right wide excision with sentinal node biopsy/axilary dissection.  . COLONOSCOPY WITH PROPOFOL N/A 10/01/2016   Procedure: COLONOSCOPY WITH PROPOFOL;  Surgeon: Robert Bellow, MD;  Location: [facility] ENDOSCOPY;  Service: Endoscopy;  Laterality: N/A;  . STRABISMUS SURGERY      Prior to Admission medications   Medication Sig Start Date End Date Taking? Authorizing Provider  Multiple Vitamins-Calcium (VIACTIV MULTI-VITAMIN PO) Take 2 tablets by mouth daily.    [provider]  Probiotic Product (PROBIOTIC-10 PO) Take by mouth.    [provider]    Allergies as of 08/11/2019 - Review Complete 08/11/2019  Allergen Reaction Noted  . Tape  08/18/2012    Family History  Problem Relation Age of Onset  . Cancer Paternal Uncle        Lung  . Cancer Cousin        Breast   . Cancer Maternal Aunt          lung  . Diabetes Mother   . Hypertension Father   . Diabetes Father   . Breast cancer Neg Hx   . Ovarian cancer Neg Hx   . Colon cancer Neg Hx     Social History   Socioeconomic History  . Marital status: Divorced    Spouse name: Not on file  . Number of children: 0  . Years of education: Not on file  . Highest education level: Not on file  Occupational History  . Not on file  Tobacco Use  . Smoking status: Never Smoker  . Smokeless tobacco: Never Used  Substance and Sexual Activity  . Alcohol use: Yes    Comment: occassionally   . Drug use: No  . Sexual activity: Not Currently    Birth control/protection: None  Other Topics Concern  . Not on file  Social History Narrative  . Not on file   Social Determinants of Health   Financial Resource Strain:   . Difficulty of Paying Living Expenses:   Food Insecurity:   . Worried About Charity fundraiser in the Last Year:   . Arboriculturist in the Last Year:   Transportation Needs:   . Film/video editor (Medical):   Marland Kitchen Lack of Transportation (Non-Medical):   Physical Activity:   . Days of Exercise per Week:   .  Minutes of Exercise per Session:   Stress:   . Feeling of Stress :   Social Connections:   . Frequency of Communication with Friends and Family:   . Frequency of Social Gatherings with Friends and Family:   . Attends Religious Services:   . Active Member of Clubs or Organizations:   . Attends Archivist Meetings:   Marland Kitchen Marital Status:   Intimate Partner Violence:   . Fear of Current or Ex-Partner:   . Emotionally Abused:   Marland Kitchen Physically Abused:   . Sexually Abused:     Review of Systems: See HPI, otherwise negative ROS  Physical Exam: BP (!) 130/97   Pulse 66   Temp 97.9 F (36.6 C) (Oral)   Resp 16   Ht '5\' 1"'$  (1.549 m)   Wt 67.6 kg   LMP 07/27/2012 (Exact Date) Comment: Hcg  SpO2 100%   BMI 28.15 kg/m  General:   Alert,  pleasant and cooperative in NAD Head:  Normocephalic  and atraumatic. Neck:  Supple; no masses or thyromegaly. Lungs:  Clear throughout to auscultation, normal respiratory effort.    Heart:  +S1, +S2, Regular rate and rhythm, No edema. Abdomen:  Soft, nontender and nondistended. Normal bowel sounds, without guarding, and without rebound.   Neurologic:  Alert and  oriented x4;  grossly normal neurologically.  Impression/Plan: Natalie Gallagher is here for a colonoscopy to be performed for diarrhea Risks, benefits, limitations, and alternatives regarding  colonoscopy have been reviewed with the patient.  Questions have been answered.  All parties agreeable.   Virgel Manifold, MD  09/07/2019, 10:13 AM

## 2019-09-07 NOTE — Anesthesia Postprocedure Evaluation (Signed)
Anesthesia Post Note  Patient: Natalie Gallagher  Procedure(s) Performed: COLONOSCOPY WITH PROPOFOL (N/A )  Patient location during evaluation: Endoscopy Anesthesia Type: General Level of consciousness: awake and alert Pain management: pain level controlled Vital Signs Assessment: post-procedure vital signs reviewed and stable Respiratory status: spontaneous breathing, nonlabored ventilation, respiratory function stable and patient connected to nasal cannula oxygen Cardiovascular status: blood pressure returned to baseline and stable Postop Assessment: no apparent nausea or vomiting Anesthetic complications: no     Last Vitals:  Vitals:   09/07/19 1110 09/07/19 1120  BP: (!) 144/82 (!) 156/97  Pulse:    Resp:    Temp:    SpO2:      Last Pain:  Vitals:   09/07/19 1120  TempSrc:   PainSc: 0-No pain                 Arita Miss

## 2019-09-07 NOTE — Op Note (Signed)
[facility] Patient Name: Sandeep Forestier Procedure Date: 09/07/2019 10:11 AM MRN: [id number] Account #: [id number] Date of Birth: Oct 17, 1970 Admit Type: Outpatient Age: 49 Room: [facility] ENDO ROOM 3 Gender: Female Note Status: Finalized Procedure:             Colonoscopy Indications:           Clinically significant diarrhea of unexplained origin Providers:             Luvena Wentling B. Maximino Greenland MD, MD Referring MD:          Ferdie Ping Merlene Pulling (Referring MD), Bari Edward, MD                         (Referring MD) Medicines:             Monitored Anesthesia Care Complications:         No immediate complications. Procedure:             Pre-Anesthesia Assessment:                        - Prior to the procedure, a History and Physical was                         performed, and patient medications, allergies and                         sensitivities were reviewed. The patient's tolerance                         of previous anesthesia was reviewed.                        - The risks and benefits of the procedure and the                         sedation options and risks were discussed with the                         patient. All questions were answered and informed                         consent was obtained.                        - Patient identification and proposed procedure were                         verified prior to the procedure by the physician, the                         nurse, the anesthesiologist, the anesthetist and the                         technician. The procedure was verified in the                         pre-procedure area in the procedure room in the  endoscopy suite.                        - Prophylactic Antibiotics: The patient does not                         require prophylactic antibiotics.                        - ASA Grade Assessment: II - A patient with mild                         systemic disease.                     - After reviewing the risks and benefits, the patient                         was deemed in satisfactory condition to undergo the                         procedure.                        - Monitored anesthesia care was determined to be                         medically necessary for this procedure based on review                         of the patient's medical history, medications, and                         prior anesthesia history.                        - The anesthesia plan was to use monitored anesthesia                         care (MAC).                        After obtaining informed consent, the colonoscope was                         passed under direct vision. Throughout the procedure,                         the patient's blood pressure, pulse, and oxygen                         saturations were monitored continuously. The                         Colonoscope was introduced through the anus and                         advanced to the the terminal ileum. The colonoscopy                         was performed with ease. The patient tolerated the  procedure well. The quality of the bowel preparation                         was good. Findings:      The perianal and digital rectal examinations were normal.      The terminal ileum appeared normal. Biopsies were taken with a cold       forceps for histology.      A diffuse area of moderately altered vascular, discolored white looking       mucosa was found in the rectum and in the sigmoid colon. Biopsies were       taken with a cold forceps for histology.      A patchy area of mildly altered vascular, discolored white looking       mucosa was found in the descending colon and in the transverse colon.       Biopsies were taken with a cold forceps for histology.      Normal mucosa was found in the ascending colon and in the cecum.       Biopsies were taken with a cold forceps for histology.       The retroflexed view of the distal rectum and anal verge was normal and       showed no anal or rectal abnormalities. Impression:            - The examined portion of the ileum was normal.                         Biopsied.                        - Altered vascular, discolored white looking mucosa in                         the rectum and in the sigmoid colon. Biopsied.                        - Altered vascular, discolored white looking mucosa in                         the descending colon and in the transverse colon.                         Biopsied.                        - Normal mucosa in the ascending colon and in the                         cecum. Biopsied.                        - The distal rectum and anal verge are normal on                         retroflexion view. Recommendation:        - Await pathology results.                        - Return to my office as previously scheduled.                        -  Continue present medications.                        - Return to primary care physician as previously                         scheduled.                        - The findings and recommendations were discussed with                         the patient.                        - The findings and recommendations were discussed with                         the patient's family. Procedure Code(s):     --- Professional ---                        [id number], Colonoscopy, flexible; with biopsy, single or                         multiple Diagnosis Code(s):     --- Professional ---                        R19.7, Diarrhea, unspecified CPT copyright 2019 [employer]. All rights reserved. The codes documented in this report are preliminary and upon coder review may  be revised to meet current compliance requirements.  Melodie Bouillon, MD Michel Bickers B. Maximino Greenland MD, MD 09/07/2019 10:54:56 AM This report has been signed electronically. Number of Addenda: 0 Note Initiated  On: 09/07/2019 10:11 AM Scope Withdrawal Time: 0 hours 16 minutes 56 seconds  Total Procedure Duration: 0 hours 20 minutes 3 seconds  Estimated Blood Loss:  Estimated blood loss: none.      [facility]

## 2019-09-07 NOTE — Transfer of Care (Signed)
Immediate Anesthesia Transfer of Care Note  Patient: Natalie Gallagher  Procedure(s) Performed: COLONOSCOPY WITH PROPOFOL (N/A )  Patient Location: PACU  Anesthesia Type:General  Level of Consciousness: drowsy and patient cooperative  Airway & Oxygen Therapy: Patient Spontanous Breathing and Patient connected to nasal cannula oxygen  Post-op Assessment: Report given to RN and Post -op Vital signs reviewed and stable  Post vital signs: Reviewed and stable  Last Vitals:  Vitals Value Taken Time  BP 130/71 09/07/19 1050  Temp    Pulse 74 09/07/19 1050  Resp 18 09/07/19 1050  SpO2 100 % 09/07/19 1050  Vitals shown include unvalidated device data.  Last Pain:  Vitals:   09/07/19 0950  TempSrc: Oral  PainSc: 0-No pain         Complications: No apparent anesthesia complications

## 2019-09-08 ENCOUNTER — Encounter: Payer: Self-pay | Admitting: *Deleted

## 2019-09-08 LAB — SURGICAL PATHOLOGY

## 2019-09-13 ENCOUNTER — Other Ambulatory Visit: Payer: Self-pay | Admitting: Gastroenterology

## 2019-09-13 ENCOUNTER — Telehealth: Payer: Self-pay

## 2019-09-13 MED ORDER — BUDESONIDE 3 MG PO CPEP: 9.0000 mg | ORAL_CAPSULE | Freq: Every day | ORAL | 0 refills | Status: DC

## 2019-09-13 NOTE — Telephone Encounter (Signed)
-----   Message from Virgel Manifold, MD sent at 09/13/2019  9:51 AM EDT ----- Herb Grays please let the patient know, her biopsies show lymphocytic colitis. She should Avoid NSAID use such as Ibuprofen, Aleeve, advil, motrin, BC and Goodie powder, Naproxen, Meloxicam and others. These medications can be associated with this.   She will need to start on budesonide 9mg  a day. I have sent this to her pharmacy. She will need to follow up in clinic to assess treatment response and eventually taper off this medication.  Please set up follow-up in 4 weeks.  We can do a virtual visit if she prefers

## 2019-09-13 NOTE — Telephone Encounter (Signed)
Called patient to let her know that she has lymphocytic colitis as described as above. Patient is to call me back if she has any questions.

## 2019-09-16 ENCOUNTER — Telehealth: Payer: Self-pay | Admitting: Gastroenterology

## 2019-09-16 NOTE — Telephone Encounter (Signed)
LVM for patient to call the office to schedule a 4wk F/U per [facility]

## 2019-09-19 ENCOUNTER — Other Ambulatory Visit (INDEPENDENT_AMBULATORY_CARE_PROVIDER_SITE_OTHER): Payer: No Typology Code available for payment source | Admitting: Certified Nurse Midwife

## 2019-09-19 DIAGNOSIS — N950 Postmenopausal bleeding: Secondary | ICD-10-CM

## 2019-09-19 DIAGNOSIS — R9389 Abnormal findings on diagnostic imaging of other specified body structures: Secondary | ICD-10-CM

## 2019-09-19 MED ORDER — MEDROXYPROGESTERONE ACETATE 10 MG PO TABS
10.0000 mg | ORAL_TABLET | Freq: Every day | ORAL | 0 refills | Status: DC
Start: 2019-09-19 — End: 2019-11-08

## 2019-09-19 NOTE — Progress Notes (Signed)
Rx Provera, see orders.    Diona Fanti, CNM [facility], [facility] 09/19/19 5:33 PM

## 2019-09-20 ENCOUNTER — Telehealth: Payer: Self-pay | Admitting: *Deleted

## 2019-09-20 NOTE — Telephone Encounter (Signed)
Her oncologist advised her against taking Provera. Next steps? JML

## 2019-09-20 NOTE — Telephone Encounter (Signed)
  Please call patient.  Based on data from the [facility] studies, there is an increased risk of breast cancer.  I would tend to avoid use of Provera.  M

## 2019-09-20 NOTE — Telephone Encounter (Signed)
Patient called reporting that Dr Marcelline Mates has ordered Provera for her to take and she is asking if Dr Mike Gip thinks this would be alright to take. Please advise

## 2019-09-21 NOTE — Telephone Encounter (Signed)
Just taking it for 10 days (or even 7 days) will not increase her risk of cancer.  If taking it for long term it can. However, you could try some Lysteda to help.

## 2019-09-22 ENCOUNTER — Other Ambulatory Visit (INDEPENDENT_AMBULATORY_CARE_PROVIDER_SITE_OTHER): Payer: No Typology Code available for payment source | Admitting: Certified Nurse Midwife

## 2019-09-22 DIAGNOSIS — N920 Excessive and frequent menstruation with regular cycle: Secondary | ICD-10-CM

## 2019-09-22 DIAGNOSIS — N939 Abnormal uterine and vaginal bleeding, unspecified: Secondary | ICD-10-CM

## 2019-09-22 MED ORDER — TRANEXAMIC ACID 650 MG PO TABS
1300.0000 mg | ORAL_TABLET | Freq: Three times a day (TID) | ORAL | 0 refills | Status: DC
Start: 2019-09-22 — End: 2020-02-23

## 2019-09-22 NOTE — Progress Notes (Signed)
Rx Lysteda, see orders.    Diona Fanti, CNM [facility], [facility] 09/22/19 1:55 PM

## 2019-10-25 ENCOUNTER — Telehealth (INDEPENDENT_AMBULATORY_CARE_PROVIDER_SITE_OTHER): Payer: Managed Care, Other (non HMO) | Admitting: Gastroenterology

## 2019-10-25 DIAGNOSIS — K52832 Lymphocytic colitis: Secondary | ICD-10-CM | POA: Diagnosis not present

## 2019-10-25 MED ORDER — COLESTIPOL HCL 1 G PO TABS: 4.0000 g | ORAL_TABLET | Freq: Every day | ORAL | 1 refills | Status: DC

## 2019-10-25 NOTE — Progress Notes (Signed)
Natalie Antigua, MD [address]  [place]  [place], [place] [address]  Main: [phone]  Fax: [phone]   Primary Care Physician: Glean Hess, MD  Virtual Visit via Video Note  I connected with patient on 10/25/19 at  1:45 PM EDT by video (using [email or web address]) and verified that I am speaking with the correct person using two identifiers.   I discussed the limitations, risks, security and privacy concerns of performing an evaluation and management service by video and the availability of in person appointments. I also discussed with the patient that there may be a patient responsible charge related to this service. The patient expressed understanding and agreed to proceed.  Location of Patient: Home Location of Provider: Home Persons involved: Patient and provider only (Nursing staff checked in patient via phone but were not physically involved in the video interaction - see their notes)   History of Present Illness: Chief complaint: Diarrhea  HPI: Natalie Gallagher is a 49 y.o. female here for follow-up of diarrhea and lymphocytic colitis.  Patient was prescribed desonide on September 13, 2019, and states she picked it up and he started taking it within a week of the prescription date.  She is taking 9 mg daily without missing doses.  States diarrhea has gone from water consistency to stool consistency.  However, still continues to have frequent bowel movements a day, 3 to 4-day.  No blood in stool.  No nocturnal bowel movements.  Not on any NSAIDs.  Current Outpatient Medications  Medication Sig Dispense Refill  . budesonide (ENTOCORT EC) 3 MG 24 hr capsule Take 3 capsules (9 mg total) by mouth daily. 200 capsule 0  . colestipol (COLESTID) 1 g tablet Take 4 tablets (4 g total) by mouth daily. 120 tablet 1  . medroxyPROGESTERone (PROVERA) 10 MG tablet Take 1 tablet (10 mg total) by mouth daily. Use for ten days 10 tablet 0  . Multiple Vitamins-Calcium (VIACTIV MULTI-VITAMIN PO)  Take 2 tablets by mouth daily.    . Probiotic Product (PROBIOTIC-10 PO) Take by mouth.    . tranexamic acid (LYSTEDA) 650 MG TABS tablet Take 2 tablets (1,300 mg total) by mouth 3 (three) times daily. Take during menses for a maximum of five days 30 tablet 0   No current facility-administered medications for this visit.    Allergies as of 10/25/2019 - Review Complete 09/07/2019  Allergen Reaction Noted  . Tape  08/18/2012    Review of Systems:    All systems reviewed and negative except where noted in HPI.   Observations/Objective:  Labs: CMP     Component Value Date/Time   NA 138 08/22/2019 1519   NA 140 02/07/2019 0907   NA 144 05/12/2014 1041   K 3.9 08/22/2019 1519   K 3.8 05/12/2014 1041   CL 104 08/22/2019 1519   CL 106 05/12/2014 1041   CO2 25 08/22/2019 1519   CO2 32 05/12/2014 1041   GLUCOSE 83 08/22/2019 1519   GLUCOSE 75 05/12/2014 1041   BUN 15 08/22/2019 1519   BUN 12 02/07/2019 0907   BUN 14 05/12/2014 1041   CREATININE 0.84 08/22/2019 1519   CREATININE 0.93 05/12/2014 1041   CALCIUM 9.0 08/22/2019 1519   CALCIUM 8.7 05/12/2014 1041   PROT 7.6 08/22/2019 1519   PROT 6.8 02/07/2019 0907   PROT 7.2 05/12/2014 1041   ALBUMIN 3.7 08/22/2019 1519   ALBUMIN 4.4 02/07/2019 0907   ALBUMIN 3.1 (L) 05/12/2014 1041   AST 21  08/22/2019 1519   AST 9 (L) 05/12/2014 1041   ALT 19 08/22/2019 1519   ALT 21 05/12/2014 1041   ALKPHOS 61 08/22/2019 1519   ALKPHOS 59 05/12/2014 1041   BILITOT 0.3 08/22/2019 1519   BILITOT 0.2 02/07/2019 0907   BILITOT 0.2 05/12/2014 1041   GFRNONAA >60 08/22/2019 1519   GFRNONAA >60 05/12/2014 1041   GFRNONAA >60 11/09/2013 1139   GFRAA >60 08/22/2019 1519   GFRAA >60 05/12/2014 1041   GFRAA >60 11/09/2013 1139   Lab Results  Component Value Date   WBC 5.5 08/22/2019   HGB 12.7 08/22/2019   HCT 38.9 08/22/2019   MCV 88.0 08/22/2019   PLT 218 08/22/2019    Imaging Studies: No results found.  Assessment and Plan:    Natalie Gallagher is a 49 y.o. y/o female here for follow-up of lymphocytic colitis  Assessment and Plan: Patient has been taking budesonide for 5 weeks, 9 mg a day, with only partial response  Start Colestid in addition to the above.  Continue budesonide for at least 8 weeks  Patient also was advised to use loperamide, only 3-4 times a day as needed for diarrhea  Follow-up in 2 weeks and reassess symptoms  Follow Up Instructions:    I discussed the assessment and treatment plan with the patient. The patient was provided an opportunity to ask questions and all were answered. The patient agreed with the plan and demonstrated an understanding of the instructions.   The patient was advised to call back or seek an in-person evaluation if the symptoms worsen or if the condition fails to improve as anticipated.  I provided 13 minutes of face-to-face time via video software during this encounter. Additional time was spent in reviewing patient's chart, placing orders etc.   Virgel Manifold, MD  Speech recognition software was used to dictate this note.

## 2019-10-27 ENCOUNTER — Telehealth: Payer: Self-pay

## 2019-10-27 NOTE — Telephone Encounter (Signed)
Called patient back and she was not too happy with the response because she stated that she just can't swallow the tablet due to it's size. However, patient wanted to know what would be the long term effect if she does not take this treatment. Can you please send her a MyChart message with your response to this questions since I will not be here to call her and let her know. Thank you!

## 2019-10-27 NOTE — Telephone Encounter (Signed)
Patient called stating that she is not able to take the medications you prescribed her due to choking on them since "they are huge". I told her that I would call the pharmacy and ask if they could be cut and she stated that she already called and told her that they can not be cut. Patient would like to know what else she could take. Please advise.

## 2019-11-08 ENCOUNTER — Other Ambulatory Visit: Payer: Self-pay

## 2019-11-09 ENCOUNTER — Encounter: Payer: Self-pay | Admitting: Gastroenterology

## 2019-11-09 ENCOUNTER — Telehealth (INDEPENDENT_AMBULATORY_CARE_PROVIDER_SITE_OTHER): Payer: Managed Care, Other (non HMO) | Admitting: Gastroenterology

## 2019-11-09 DIAGNOSIS — K52832 Lymphocytic colitis: Secondary | ICD-10-CM

## 2019-11-09 MED ORDER — BUDESONIDE 3 MG PO CPEP: ORAL_CAPSULE | ORAL | 0 refills | Status: DC

## 2019-11-09 NOTE — Progress Notes (Signed)
[name], MD [address]  [place]  [place], [place] [address]  Main: [phone]  Fax: [phone]   Primary Care Physician: Glean Hess, MD  Virtual Visit via Telephone Note  I connected with patient on 11/09/19 at  1:30 PM EDT by telephone and verified that I am speaking with the correct person using two identifiers.   I discussed the limitations, risks, security and privacy concerns of performing an evaluation and management service by telephone and the availability of in person appointments. I also discussed with the patient that there may be a patient responsible charge related to this service. The patient expressed understanding and agreed to proceed.  Location of Patient: Home Location of Provider: Home Persons involved: Patient and provider only during the visit (nursing staff and front desk staff was involved in communicating with the patient prior to the appointment, reviewing medications and checking them in)   History of Present Illness: Chief Complaint  Patient presents with  . Results     HPI: Natalie Gallagher is a 49 y.o. female here for follow-up of diarrhea and lymphocytic colitis.  Patient's colonoscopy findings were impressive and that endoscopically colon mucosa appeared abnormal and biopsies were consistent with lymphocytic colitis.  Patient started budesonide on September 13, 2019.  She went from having 3-4 loose bowel movements a day to 1-2 loose bowel movements a day.  On previous visit she had reported continued diarrhea despite budesonide and Colestid was prescribed but she did not start taking this as patient stated that the pill was too large and she has problems swallowing large pills.  No abdominal pain, blood in stool.  Current Outpatient Medications  Medication Sig Dispense Refill  . budesonide (ENTOCORT EC) 3 MG 24 hr capsule Take 3 capsules (9 mg total) by mouth daily for 28 days, THEN 2 capsules (6 mg total) daily for 14 days, THEN 1  capsule (3 mg total) daily for 14 days. 126 capsule 0  . Multiple Vitamins-Calcium (VIACTIV MULTI-VITAMIN PO) Take 2 tablets by mouth daily.    . Probiotic Product (PROBIOTIC-10 PO) Take by mouth. (Patient not taking: Reported on 11/09/2019)    . tranexamic acid (LYSTEDA) 650 MG TABS tablet Take 2 tablets (1,300 mg total) by mouth 3 (three) times daily. Take during menses for a maximum of five days (Patient not taking: Reported on 11/09/2019) 30 tablet 0   No current facility-administered medications for this visit.    Allergies as of 11/09/2019 - Review Complete 11/09/2019  Allergen Reaction Noted  . Tape  08/18/2012    Review of Systems:    All systems reviewed and negative except where noted in HPI.   Observations/Objective:  Labs: CMP     Component Value Date/Time   NA 138 08/22/2019 1519   NA 140 02/07/2019 0907   NA 144 05/12/2014 1041   K 3.9 08/22/2019 1519   K 3.8 05/12/2014 1041   CL 104 08/22/2019 1519   CL 106 05/12/2014 1041   CO2 25 08/22/2019 1519   CO2 32 05/12/2014 1041   GLUCOSE 83 08/22/2019 1519   GLUCOSE 75 05/12/2014 1041   BUN 15 08/22/2019 1519   BUN 12 02/07/2019 0907   BUN 14 05/12/2014 1041   CREATININE 0.84 08/22/2019 1519   CREATININE 0.93 05/12/2014 1041   CALCIUM 9.0 08/22/2019 1519   CALCIUM 8.7 05/12/2014 1041   PROT 7.6 08/22/2019 1519   PROT 6.8 02/07/2019 0907   PROT 7.2 05/12/2014 1041   ALBUMIN  3.7 08/22/2019 1519   ALBUMIN 4.4 02/07/2019 0907   ALBUMIN 3.1 (L) 05/12/2014 1041   AST 21 08/22/2019 1519   AST 9 (L) 05/12/2014 1041   ALT 19 08/22/2019 1519   ALT 21 05/12/2014 1041   ALKPHOS 61 08/22/2019 1519   ALKPHOS 59 05/12/2014 1041   BILITOT 0.3 08/22/2019 1519   BILITOT 0.2 02/07/2019 0907   BILITOT 0.2 05/12/2014 1041   GFRNONAA >60 08/22/2019 1519   GFRNONAA >60 05/12/2014 1041   GFRNONAA >60 11/09/2013 1139   GFRAA >60 08/22/2019 1519   GFRAA >60 05/12/2014 1041   GFRAA >60 11/09/2013 1139   Lab Results  Component  Value Date   WBC 5.5 08/22/2019   HGB 12.7 08/22/2019   HCT 38.9 08/22/2019   MCV 88.0 08/22/2019   PLT 218 08/22/2019    Imaging Studies: No results found.  Assessment and Plan:   Natalie Gallagher is a 49 y.o. y/o female here for follow-up of diarrhea and lymphocytic colitis  Assessment and Plan: She has shown improvement in her symptoms and is having less frequent bowel movements  However, due to continued loose bowel movements and since improvement in frequency of symptoms occurred only after about 6 to 8 weeks of treatment, we will continue budesonide at 9 mg a day for total of 12 weeks and then taper to 6 mg for 2 weeks and 3 mg for 2 weeks.  New prescription with the above instructions sent to the pharmacy and discussed with the patient and she verbalized understanding  Patient advised to not take Colestid and this has been taken off her medication list.  Patient did not start this because it was too large for her to swallow and since her bowel movement frequency is less, no need to start Colestid at this time.  Can consider powder form in the future if symptoms recur    Follow Up Instructions:    I discussed the assessment and treatment plan with the patient. The patient was provided an opportunity to ask questions and all were answered. The patient agreed with the plan and demonstrated an understanding of the instructions.   The patient was advised to call back or seek an in-person evaluation if the symptoms worsen or if the condition fails to improve as anticipated.  I provided 12 minutes of non-face-to-face time during this encounter. Additional time was spent in reviewing patient's chart, placing orders etc.   Virgel Manifold, MD  Speech recognition software was used to dictate this note.

## 2019-11-17 ENCOUNTER — Other Ambulatory Visit: Payer: Self-pay | Admitting: Gastroenterology

## 2019-12-14 ENCOUNTER — Telehealth (INDEPENDENT_AMBULATORY_CARE_PROVIDER_SITE_OTHER): Payer: Managed Care, Other (non HMO) | Admitting: Gastroenterology

## 2019-12-14 DIAGNOSIS — K52832 Lymphocytic colitis: Secondary | ICD-10-CM

## 2019-12-15 MED ORDER — CHOLESTYRAMINE 4 GM/DOSE PO POWD: 4.0000 g | Freq: Four times a day (QID) | ORAL | 1 refills | Status: DC

## 2019-12-15 NOTE — Progress Notes (Signed)
[name], MD [address]  [place]  [place], [place] [address]  Main: [phone]  Fax: [phone]   Primary Care Physician: Glean Hess, MD  Virtual Visit via Telephone Note  I connected with patient on 12/15/19 at  3:45 PM EDT by telephone and verified that I am speaking with the correct person using two identifiers.   I discussed the limitations, risks, security and privacy concerns of performing an evaluation and management service by telephone and the availability of in person appointments. I also discussed with the patient that there may be a patient responsible charge related to this service. The patient expressed understanding and agreed to proceed.  Location of Patient: Home Location of Provider: Home Persons involved: Patient and provider only during the visit (nursing staff and front desk staff was involved in communicating with the patient prior to the appointment, reviewing medications and checking them in)   History of Present Illness: Chief Complaint  Patient presents with  . Diarrhea     HPI: Natalie Gallagher is a 49 y.o. female here for follow-up of lymphocytic colitis.  Patient is taking budesonide still at 9 mg a day and is starting the tapering dose in the next couple days.  She is reporting that she is 60% better but is still continuing to have diarrhea.  Baseline she had 3-4 loose stools a day now is having 2-3 loose stools a day.  No blood in stool.  No weight loss.  Current Outpatient Medications  Medication Sig Dispense Refill  . budesonide (ENTOCORT EC) 3 MG 24 hr capsule Take 3 capsules (9 mg total) by mouth daily for 28 days, THEN 2 capsules (6 mg total) daily for 14 days, THEN 1 capsule (3 mg total) daily for 14 days. 126 capsule 0  . Multiple Vitamins-Calcium (VIACTIV MULTI-VITAMIN PO) Take 2 tablets by mouth daily.    . Probiotic Product (PROBIOTIC-10 PO) Take by mouth.     . tranexamic acid (LYSTEDA) 650 MG TABS tablet Take 2  tablets (1,300 mg total) by mouth 3 (three) times daily. Take during menses for a maximum of five days 30 tablet 0  . cholestyramine (QUESTRAN) 4 GM/DOSE powder Take 1 packet (4 g total) by mouth 4 (four) times daily. 120 packet 1   No current facility-administered medications for this visit.    Allergies as of 12/14/2019 - Review Complete 12/14/2019  Allergen Reaction Noted  . Tape  08/18/2012    Review of Systems:    All systems reviewed and negative except where noted in HPI.   Observations/Objective:  Labs: CMP     Component Value Date/Time   NA 138 08/22/2019 1519   NA 140 02/07/2019 0907   NA 144 05/12/2014 1041   K 3.9 08/22/2019 1519   K 3.8 05/12/2014 1041   CL 104 08/22/2019 1519   CL 106 05/12/2014 1041   CO2 25 08/22/2019 1519   CO2 32 05/12/2014 1041   GLUCOSE 83 08/22/2019 1519   GLUCOSE 75 05/12/2014 1041   BUN 15 08/22/2019 1519   BUN 12 02/07/2019 0907   BUN 14 05/12/2014 1041   CREATININE 0.84 08/22/2019 1519   CREATININE 0.93 05/12/2014 1041   CALCIUM 9.0 08/22/2019 1519   CALCIUM 8.7 05/12/2014 1041   PROT 7.6 08/22/2019 1519   PROT 6.8 02/07/2019 0907   PROT 7.2 05/12/2014 1041   ALBUMIN 3.7 08/22/2019 1519   ALBUMIN 4.4 02/07/2019 0907   ALBUMIN 3.1 (L) 05/12/2014 1041  AST 21 08/22/2019 1519   AST 9 (L) 05/12/2014 1041   ALT 19 08/22/2019 1519   ALT 21 05/12/2014 1041   ALKPHOS 61 08/22/2019 1519   ALKPHOS 59 05/12/2014 1041   BILITOT 0.3 08/22/2019 1519   BILITOT 0.2 02/07/2019 0907   BILITOT 0.2 05/12/2014 1041   GFRNONAA >60 08/22/2019 1519   GFRNONAA >60 05/12/2014 1041   GFRNONAA >60 11/09/2013 1139   GFRAA >60 08/22/2019 1519   GFRAA >60 05/12/2014 1041   GFRAA >60 11/09/2013 1139   Lab Results  Component Value Date   WBC 5.5 08/22/2019   HGB 12.7 08/22/2019   HCT 38.9 08/22/2019   MCV 88.0 08/22/2019   PLT 218 08/22/2019    Imaging Studies: No results found.  Assessment and Plan:   Natalie Gallagher is a 49 y.o. y/o  female here for follow-up of lymphocytic colitis  Assessment and Plan: Due to mild persistent diarrhea despite budesonide therapy, would recommend cholestyramine at this time  Previously colestipol was prescribed but patient stated that the pills were too large for her to swallow and therefore was unable to use it  We will reassess symptoms after 2 weeks of cholestyramine  Continue budesonide taper as instructed on last prescription  She has had response to therapy as she states she is 60% better, therefore we do not need to want to therapies meant for nonresponders at this time  Follow Up Instructions:    I discussed the assessment and treatment plan with the patient. The patient was provided an opportunity to ask questions and all were answered. The patient agreed with the plan and demonstrated an understanding of the instructions.   The patient was advised to call back or seek an in-person evaluation if the symptoms worsen or if the condition fails to improve as anticipated.  I provided 12 minutes of non-face-to-face time during this encounter. Additional time was spent in reviewing patient's chart, placing orders etc.   Virgel Manifold, MD  Speech recognition software was used to dictate this note.

## 2019-12-28 ENCOUNTER — Telehealth (INDEPENDENT_AMBULATORY_CARE_PROVIDER_SITE_OTHER): Payer: Managed Care, Other (non HMO) | Admitting: Gastroenterology

## 2019-12-28 DIAGNOSIS — K52832 Lymphocytic colitis: Secondary | ICD-10-CM

## 2019-12-28 NOTE — Progress Notes (Signed)
[name], MD [address]  [place]  [place], [place] [address]  Main: [phone]  Fax: [phone]   Primary Care Physician: Glean Hess, MD  Virtual Visit via Telephone Note  I connected with patient on 12/28/19 at  1:45 PM EDT by telephone and verified that I am speaking with the correct person using two identifiers.   I discussed the limitations, risks, security and privacy concerns of performing an evaluation and management service by telephone and the availability of in person appointments. I also discussed with the patient that there may be a patient responsible charge related to this service. The patient expressed understanding and agreed to proceed.  Location of Patient: Home Location of Provider: Home Persons involved: Patient and provider only during the visit (nursing staff and front desk staff was involved in communicating with the patient prior to the appointment, reviewing medications and checking them in)   History of Present Illness: Chief complaint: Diarrhea  HPI: Brittini Brubeck is a 49 y.o. female here for follow-up of lymphocytic colitis and diarrhea.  Patient was started on cholestyramine in addition to budesonide on last visit.  Cholestyramine started on December 15, 2019.  Patient had partial response to budesonide and was still having over 3 loose bowel movements a day on it, but was 60% better and therefore was not a nonresponder.  Since starting cholestyramine, patient states diarrhea is better.  Now is having 2 bowel movements a day that are more formed.  No blood in stool.  No nausea vomiting or abdominal pain.  Has been tapering budesonide and only has 2 or 3 more days left.  No exacerbation of symptoms since she has been tapering it.   Current Outpatient Medications  Medication Sig Dispense Refill  . budesonide (ENTOCORT EC) 3 MG 24 hr capsule Take 3 capsules (9 mg total) by mouth daily for 28 days, THEN 2 capsules (6 mg total) daily  for 14 days, THEN 1 capsule (3 mg total) daily for 14 days. 126 capsule 0  . cholestyramine (QUESTRAN) 4 GM/DOSE powder Take 1 packet (4 g total) by mouth 4 (four) times daily. 120 packet 1  . Multiple Vitamins-Calcium (VIACTIV MULTI-VITAMIN PO) Take 2 tablets by mouth daily.    . Probiotic Product (PROBIOTIC-10 PO) Take by mouth.     . tranexamic acid (LYSTEDA) 650 MG TABS tablet Take 2 tablets (1,300 mg total) by mouth 3 (three) times daily. Take during menses for a maximum of five days 30 tablet 0   No current facility-administered medications for this visit.    Allergies as of 12/28/2019 - Review Complete 12/14/2019  Allergen Reaction Noted  . Tape  08/18/2012    Review of Systems:    All systems reviewed and negative except where noted in HPI.   Observations/Objective:  Labs: CMP     Component Value Date/Time   NA 138 08/22/2019 1519   NA 140 02/07/2019 0907   NA 144 05/12/2014 1041   K 3.9 08/22/2019 1519   K 3.8 05/12/2014 1041   CL 104 08/22/2019 1519   CL 106 05/12/2014 1041   CO2 25 08/22/2019 1519   CO2 32 05/12/2014 1041   GLUCOSE 83 08/22/2019 1519   GLUCOSE 75 05/12/2014 1041   BUN 15 08/22/2019 1519   BUN 12 02/07/2019 0907   BUN 14 05/12/2014 1041   CREATININE 0.84 08/22/2019 1519   CREATININE 0.93 05/12/2014 1041   CALCIUM 9.0 08/22/2019 1519   CALCIUM 8.7 05/12/2014 1041  PROT 7.6 08/22/2019 1519   PROT 6.8 02/07/2019 0907   PROT 7.2 05/12/2014 1041   ALBUMIN 3.7 08/22/2019 1519   ALBUMIN 4.4 02/07/2019 0907   ALBUMIN 3.1 (L) 05/12/2014 1041   AST 21 08/22/2019 1519   AST 9 (L) 05/12/2014 1041   ALT 19 08/22/2019 1519   ALT 21 05/12/2014 1041   ALKPHOS 61 08/22/2019 1519   ALKPHOS 59 05/12/2014 1041   BILITOT 0.3 08/22/2019 1519   BILITOT 0.2 02/07/2019 0907   BILITOT 0.2 05/12/2014 1041   GFRNONAA >60 08/22/2019 1519   GFRNONAA >60 05/12/2014 1041   GFRNONAA >60 11/09/2013 1139   GFRAA >60 08/22/2019 1519   GFRAA >60 05/12/2014 1041    GFRAA >60 11/09/2013 1139   Lab Results  Component Value Date   WBC 5.5 08/22/2019   HGB 12.7 08/22/2019   HCT 38.9 08/22/2019   MCV 88.0 08/22/2019   PLT 218 08/22/2019    Imaging Studies: No results found.  Assessment and Plan:   Elandra Powell is a 49 y.o. y/o female here for follow-up of lymphocytic colitis  Assessment and Plan: Cholestyramine is helping.  Continue at 4 g daily dosing.  Continue to taper Entocort off  Given grossly abnormal endoscopic findings in this patient in June 2021 colonoscopy, anticipate repeat colonoscopy in around 3 to 6 months to ensure endoscopic improvement of lymphocytic colitis as well  Continue cholestyramine for another 2 to 3 months or until diarrhea completely resolves  If symptoms worsen, patient advised to let us know and she verbalized understanding  Follow Up Instructions:    I discussed the assessment and treatment plan with the patient. The patient was provided an opportunity to ask questions and all were answered. The patient agreed with the plan and demonstrated an understanding of the instructions.   The patient was advised to call back or seek an in-person evaluation if the symptoms worsen or if the condition fails to improve as anticipated.  I provided 10 minutes of non-face-to-face time during this encounter. Additional time was spent in reviewing patient's chart, placing orders etc.   Virgel Manifold, MD  Speech recognition software was used to dictate this note.

## 2020-01-07 ENCOUNTER — Other Ambulatory Visit: Payer: Self-pay | Admitting: Gastroenterology

## 2020-02-03 ENCOUNTER — Other Ambulatory Visit: Payer: Self-pay | Admitting: Gastroenterology

## 2020-02-08 ENCOUNTER — Other Ambulatory Visit: Payer: Self-pay | Admitting: Gastroenterology

## 2020-02-10 ENCOUNTER — Encounter: Payer: No Typology Code available for payment source | Admitting: Internal Medicine

## 2020-02-17 ENCOUNTER — Telehealth: Payer: Self-pay | Admitting: *Deleted

## 2020-02-17 NOTE — Telephone Encounter (Signed)
Patient called requesting a letter for exemption for the COVID vaccine since she has lymphedema. Please contact patient when ready

## 2020-02-17 NOTE — Telephone Encounter (Signed)
  Please call patient.  I am unaware of exemptions for the COVID-19 vaccine secondary to lymphedema.  Does she has a reference?  I will ask our ID specialist.   Lequita Asal, MD

## 2020-02-21 ENCOUNTER — Telehealth: Payer: Self-pay

## 2020-02-21 NOTE — Telephone Encounter (Signed)
Patient states that she does not have any references regarding the covid vaccine and lymphedema. She has just heard of people having swollen lymph nodes. Patient aware that Dr Mike Gip will check with ID specialist about any exemptions for the vaccine.

## 2020-02-23 ENCOUNTER — Ambulatory Visit (INDEPENDENT_AMBULATORY_CARE_PROVIDER_SITE_OTHER): Payer: Managed Care, Other (non HMO)

## 2020-02-23 ENCOUNTER — Encounter: Payer: Self-pay | Admitting: Certified Nurse Midwife

## 2020-02-23 ENCOUNTER — Ambulatory Visit (INDEPENDENT_AMBULATORY_CARE_PROVIDER_SITE_OTHER): Payer: Managed Care, Other (non HMO) | Admitting: Certified Nurse Midwife

## 2020-02-23 ENCOUNTER — Other Ambulatory Visit: Payer: Self-pay

## 2020-02-23 DIAGNOSIS — R9389 Abnormal findings on diagnostic imaging of other specified body structures: Secondary | ICD-10-CM | POA: Diagnosis not present

## 2020-02-23 DIAGNOSIS — D251 Intramural leiomyoma of uterus: Secondary | ICD-10-CM

## 2020-02-23 DIAGNOSIS — N83202 Unspecified ovarian cyst, left side: Secondary | ICD-10-CM | POA: Diagnosis not present

## 2020-02-23 DIAGNOSIS — N950 Postmenopausal bleeding: Secondary | ICD-10-CM

## 2020-02-23 NOTE — Progress Notes (Signed)
GYN ENCOUNTER NOTE  Subjective:       Natalie Gallagher is a 49 y.o. G0P0 female here for follow up ultrasound and results review. Originally seen in office for evaluation of postmenopausal bleeding and uterine fibroids with negative work up.   Reports vaginal bleeding stopped in June, notice light spotting in September with nothing since that time.   Denies difficulty breathing or respiratory distress, chest pain, abdominal pain, vaginal bleeding, dysuria, and leg pain or swelling.    Gynecologic History  Patient's last menstrual period was 12/09/2012.  Contraception: post menopausal status  Last Pap: 01/2017. Results were: Neg/Neg  Last mammogram: 08/2019. Results were: BI-RADS 1  Obstetric History  OB History  Gravida Para Term Preterm AB Living  0            SAB TAB Ectopic Multiple Live Births             Obstetric Comments  Age first period 51    Past Medical History:  Diagnosis Date  . Breast cancer ([place]) 2014   Chemo/radiaiton- Rt.  . Breast cancer of upper-outer quadrant of right female breast [facility]) March 2014   T1c,N0,M0; BRCA neg. ER: 60%; PR: 70%, her 2 neu not over expressed.   . Cancer [facility]) 2014   right breast.right wide excision with sentinal node biopsy on 06/21/12  . Colitis   . Lymphedema 2015   Right Arm.  . Personal history of chemotherapy   . Personal history of radiation therapy     Past Surgical History:  Procedure Laterality Date  . BREAST BIOPSY Right 2014   Positive  . BREAST BIOPSY Left 03/2019   MRI Biopsy, benign, cylindrical tissue marker clip   . BREAST BIOPSY Left 03/2019   MRI Biopsy, benign, Dumb-bell clip  . BREAST LUMPECTOMY Right 2014  . BREAST SURGERY Right 06/21/2012   right wide excision with sentinal node biopsy/axilary dissection.  . COLONOSCOPY WITH PROPOFOL N/A 10/01/2016   Procedure: COLONOSCOPY WITH PROPOFOL;  Surgeon: Robert Bellow, MD;  Location: [facility] ENDOSCOPY;  Service: Endoscopy;  Laterality: N/A;  .  COLONOSCOPY WITH PROPOFOL N/A 09/07/2019   Procedure: COLONOSCOPY WITH PROPOFOL;  Surgeon: Virgel Manifold, MD;  Location: [facility] ENDOSCOPY;  Service: Endoscopy;  Laterality: N/A;  . STRABISMUS SURGERY      Current Outpatient Medications on File Prior to Visit  Medication Sig Dispense Refill  . cholestyramine (QUESTRAN) 4 g packet TAKE 1 PACKET (4 G TOTAL) BY MOUTH 4 (FOUR) TIMES DAILY. 120 packet 0  . Multiple Vitamins-Calcium (VIACTIV MULTI-VITAMIN PO) Take 2 tablets by mouth daily.    . budesonide (ENTOCORT EC) 3 MG 24 hr capsule 3 CAPS DAILY X 28 DAYS THEN 2 CAPS DAILY X 14 DAYS THEN 1 CAP DAILY 126 capsule 0  . Probiotic Product (PROBIOTIC-10 PO) Take by mouth.      No current facility-administered medications on file prior to visit.    Allergies  Allergen Reactions  . Tape     The patient showed a cutaneous reaction to Telfa applied beneath a Tegaderm dressing. Telfa appears to be the primary offensive material.    Social History   Socioeconomic History  . Marital status: Divorced    Spouse name: Not on file  . Number of children: 0  . Years of education: Not on file  . Highest education level: Not on file  Occupational History  . Not on file  Tobacco Use  . Smoking status: Never Smoker  . Smokeless tobacco: Never Used  Vaping Use  . Vaping Use: Never used  Substance and Sexual Activity  . Alcohol use: Yes    Comment: rare  . Drug use: No  . Sexual activity: Not Currently    Birth control/protection: None  Other Topics Concern  . Not on file  Social History Narrative  . Not on file   Social Determinants of Health   Financial Resource Strain:   . Difficulty of Paying Living Expenses: Not on file  Food Insecurity:   . Worried About Charity fundraiser in the Last Year: Not on file  . Ran Out of Food in the Last Year: Not on file  Transportation Needs:   . Lack of Transportation (Medical): Not on file  . Lack of Transportation (Non-Medical): Not on file   Physical Activity:   . Days of Exercise per Week: Not on file  . Minutes of Exercise per Session: Not on file  Stress:   . Feeling of Stress : Not on file  Social Connections:   . Frequency of Communication with Friends and Family: Not on file  . Frequency of Social Gatherings with Friends and Family: Not on file  . Attends Religious Services: Not on file  . Active Member of Clubs or Organizations: Not on file  . Attends Archivist Meetings: Not on file  . Marital Status: Not on file  Intimate Partner Violence:   . Fear of Current or Ex-Partner: Not on file  . Emotionally Abused: Not on file  . Physically Abused: Not on file  . Sexually Abused: Not on file    Family History  Problem Relation Age of Onset  . Cancer Paternal Uncle        Lung  . Cancer Cousin        Breast   . Cancer Maternal Aunt        lung  . Diabetes Mother   . Hypertension Father   . Diabetes Father   . Breast cancer Neg Hx   . Ovarian cancer Neg Hx   . Colon cancer Neg Hx     The following portions of the patient's history were reviewed and updated as appropriate: allergies, current medications, past family history, past medical history, past social history, past surgical history and problem list.  Review of Systems  ROS negative except as noted above. Information obtained from patient.   Objective:   BP 122/60 (BP Location: Left Arm)   Ht $R'5\' 1"'JH$  (1.549 m)   Wt 158 lb 4.8 oz (71.8 kg)   LMP 12/09/2012 Comment: Hcg  BMI 29.91 kg/m    CONSTITUTIONAL: Well-developed, well-nourished female in no acute distress.   PHYSICAL EXAM: Not indicated.   ULTRASOUND REPORT  Location: [facility]   TECHNIQUE: Both transabdominal and transvaginal ultrasound examinations of the pelvis were performed. Transabdominal technique was performed for global imaging of the pelvis including uterus, ovaries, adnexal regions, and pelvic cul-de-sac. It was necessary to proceed with endovaginal exam  following the transabdominal exam to visualize the endometrium and adnexa.  Indications:AUB   Findings:  The uterus is anteverted and measures 7.6 x 3.6 x 4.6 cm. Echo texture is heterogenous with evidence of focal masses. Within the uterus are multiple suspected fibroids measuring: Fibroid 1:Anterior Fundal IM 2.8x 2.7x 2.9 cm Fibroid 2:Pedunculated fundal 2.83x 2.0 x 1. Fibroid 3: Fundal Im 5.4 x 4.6 x 5.1 cm  Endometrial lining measures 4.9 mm  Right Ovary measures 2.7 x 1.4 x 2.2 cm. It is normal in  appearance. Left Ovary measures 2.8 x 1.8 x 2.3 cm. It is normal in appearance. Survey of the adnexa demonstrates no adnexal masses. There is no free fluid in the cul de sac.  Impression: 1. Fibroid uterus. 2. Lt ovarian cyst has resolved.  Recommendations: 1.Clinical correlation with the patient's History and Physical Exam.  Assessment:   1. Postmenopausal bleeding  - US PELVIS (TRANSABDOMINAL ONLY)  2. Intramural leiomyoma of uterus  - US PELVIS (TRANSABDOMINAL ONLY)   Plan:   Ultrasound findings reviewed with patient, verbalized understanding.   Dr. Marcelline Mates consulted, agrees with assessment and plan for repeat ultrasound in six (6) months. Recommendation for hysterectomy with repeat bleeding episode.   Reviewed red flag symptoms and when to call.   RTC x  6 months for follow up or sooner if needed.    Dani Gobble, CNM [facility], [facility] 02/23/20 10:58 AM

## 2020-02-23 NOTE — Patient Instructions (Signed)
Uterine Fibroids  Uterine fibroids are lumps of tissue (tumors) in your womb (uterus). They are not cancer (are benign). Most women with this condition do not need treatment. Sometimes fibroids can affect your ability to have children (your fertility). If that happens, you may need surgery to take out the fibroids. Follow these instructions at home:  Take over-the-counter and prescription medicines only as told by your doctor. Your doctor may suggest NSAIDs (such as aspirin or ibuprofen) to help with pain.  Ask your doctor if you should: ? Take iron pills. ? Eat more foods that have iron in them, such as dark green, leafy vegetables.  If directed, apply heat to your back or belly to reduce pain. Use the heat source that your doctor recommends, such as a moist heat pack or a heating pad. ? Put a towel between your skin and the heat source. ? Leave the heat on for 20-30 minutes. ? Remove the heat if your skin turns bright red. This is especially important if you are unable to feel pain, heat, or cold. You may have a greater risk of getting burned.  Pay close attention to your period (menstrual) cycles. Tell your doctor about any changes, such as: ? A heavier blood flow than usual. ? Needing to use more pads or tampons than normal. ? A change in how many days your period lasts. ? A change in symptoms that come with your period, such as cramps or back pain.  Keep all follow-up visits as told by your doctor. This is important. Your doctor may need to watch your fibroids over time for any changes. Contact a doctor if you:  Have pain that does not get better with medicine or heat, such as pain or cramps in: ? Your back. ? The area between your hip bones (pelvic area). ? Your belly.  Have new bleeding between your periods.  Have more bleeding during or between your periods.  Feel very tired or weak.  Feel light-headed. Get help right away if you:  Pass out (faint).  Have pain in the  area between your hip bones that suddenly gets worse.  Have bleeding that soaks a tampon or pad in 30 minutes or less. Summary  Uterine fibroids are lumps of tissue (tumors) in your womb (uterus). They are not cancer.  The only treatment that most women need is taking aspirin or ibuprofen for pain.  Contact a doctor if you have pain or cramps that do not get better with medicine.  Make sure you know what symptoms you should get help for right away. This information is not intended to replace advice given to you by your health care provider. Make sure you discuss any questions you have with your health care provider. Document Revised: 03/06/2017 Document Reviewed: 02/17/2017 Elsevier Patient Education  2020 [employer].

## 2020-02-24 ENCOUNTER — Telehealth: Payer: Self-pay

## 2020-02-24 NOTE — Telephone Encounter (Signed)
Patient called and left me a voicemail to call her back but did not specify the reason. I called her bck and left a voicemail letting her know that I was calling her back and to reach out to me via phone or MyChart.

## 2020-02-24 NOTE — Telephone Encounter (Signed)
Patient called back letting me know that her birthday weekend (01/25/2021) she had a glass of wine and since then her medication had not been helping her with her symptoms. Patient stated that she is back on watery stools and not able to maintain foods since she gets frequent bowel movements. Patient would like to know what to do. Please advise.

## 2020-02-26 ENCOUNTER — Other Ambulatory Visit: Payer: Self-pay | Admitting: Gastroenterology

## 2020-02-28 ENCOUNTER — Telehealth (INDEPENDENT_AMBULATORY_CARE_PROVIDER_SITE_OTHER): Payer: Managed Care, Other (non HMO) | Admitting: Gastroenterology

## 2020-02-28 ENCOUNTER — Encounter: Payer: Self-pay | Admitting: Gastroenterology

## 2020-02-28 ENCOUNTER — Other Ambulatory Visit: Payer: Self-pay

## 2020-02-28 DIAGNOSIS — K52832 Lymphocytic colitis: Secondary | ICD-10-CM

## 2020-02-28 MED ORDER — BISMUTH SUBSALICYLATE 262 MG PO CHEW: 262.0000 mg | CHEWABLE_TABLET | Freq: Three times a day (TID) | ORAL | 0 refills | Status: AC

## 2020-02-28 NOTE — Telephone Encounter (Signed)
Patient stated that she does not have anybody here to bring her to her colonoscopy. That's why last time she had one, it was hard to make it. Patient would like to know if she has another option. Please advise.

## 2020-02-28 NOTE — Telephone Encounter (Signed)
Patient called back and I was able to let her know what Dr. Bonna Gains recommended. However, the patient still did not agree on scheduling a colonoscopy since she stated that she does not have anybody to take her to the hospital. I asked if she could ask someone from church to take her and she stated that she did not and that people work and wouldn't bother to ask them. I then offered her an appointment so she could discuss her options with Dr. Bonna Gains and patient agreed.Patient will have an office visit today with Dr. Bonna Gains to discuss options in the meantime.

## 2020-02-28 NOTE — Telephone Encounter (Signed)
Called patient back but had to leave a voicemail to call me back and to read her MyChart message with what she decides on what to do.

## 2020-02-28 NOTE — Progress Notes (Signed)
Natalie Antigua, MD [address]  [place]  [place], [place] [address]  Main: [phone]  Fax: [phone]   Primary Care Physician: Glean Hess, MD  Virtual Visit via Telephone Note  I connected with patient on 02/28/20 at  2:00 PM EST by telephone and verified that I am speaking with the correct person using two identifiers.   I discussed the limitations, risks, security and privacy concerns of performing an evaluation and management service by telephone and the availability of in person appointments. I also discussed with the patient that there may be a patient responsible charge related to this service. The patient expressed understanding and agreed to proceed.  Location of Patient: Home Location of Provider: Home Persons involved: Patient and provider only during the visit (nursing staff and front desk staff was involved in communicating with the patient prior to the appointment, reviewing medications and checking them in)   History of Present Illness: Chief Complaint  Patient presents with  . Lymphocytic colitis    Patient continues to have watery stools. Would like to know what are her other options rather then doing a colonoscopy at this time.     HPI: Natalie Gallagher is a 49 y.o. female here for follow-up of lymphocytic colitis.  Patient has been taking cholestyramine, but it was prescribed to be taken 4 times a day.  However, patient states she has been combining it as to 4 times a day dosing is inconvenient and she takes 2 packets twice a day instead.  She states initially the cholestyramine was helping but as of the last month she is continuing to have 2-3 loose stools a day.  No urgency.  No blood in stool.  No abdominal pain.  No nausea or vomiting.  She completed budesonide for lymphocytic colitis about 2 months ago and does not think that, that helped her as much as the cholestyramine.    Current Outpatient Medications  Medication Sig Dispense  Refill  . Multiple Vitamins-Calcium (VIACTIV MULTI-VITAMIN PO) Take 2 tablets by mouth daily.    Marland Kitchen bismuth subsalicylate (PEPTO BISMOL) 262 MG chewable tablet Chew 1 tablet (262 mg total) by mouth in the morning, at noon, and at bedtime. 180 tablet 0   No current facility-administered medications for this visit.    Allergies as of 02/28/2020 - Review Complete 02/28/2020  Allergen Reaction Noted  . Tape  08/18/2012    Review of Systems:    All systems reviewed and negative except where noted in HPI.   Observations/Objective:  Labs: CMP     Component Value Date/Time   NA 138 08/22/2019 1519   NA 140 02/07/2019 0907   NA 144 05/12/2014 1041   K 3.9 08/22/2019 1519   K 3.8 05/12/2014 1041   CL 104 08/22/2019 1519   CL 106 05/12/2014 1041   CO2 25 08/22/2019 1519   CO2 32 05/12/2014 1041   GLUCOSE 83 08/22/2019 1519   GLUCOSE 75 05/12/2014 1041   BUN 15 08/22/2019 1519   BUN 12 02/07/2019 0907   BUN 14 05/12/2014 1041   CREATININE 0.84 08/22/2019 1519   CREATININE 0.93 05/12/2014 1041   CALCIUM 9.0 08/22/2019 1519   CALCIUM 8.7 05/12/2014 1041   PROT 7.6 08/22/2019 1519   PROT 6.8 02/07/2019 0907   PROT 7.2 05/12/2014 1041   ALBUMIN 3.7 08/22/2019 1519   ALBUMIN 4.4 02/07/2019 0907   ALBUMIN 3.1 (L) 05/12/2014 1041   AST 21 08/22/2019 1519   AST 9 (L)  05/12/2014 1041   ALT 19 08/22/2019 1519   ALT 21 05/12/2014 1041   ALKPHOS 61 08/22/2019 1519   ALKPHOS 59 05/12/2014 1041   BILITOT 0.3 08/22/2019 1519   BILITOT 0.2 02/07/2019 0907   BILITOT 0.2 05/12/2014 1041   GFRNONAA >60 08/22/2019 1519   GFRNONAA >60 05/12/2014 1041   GFRNONAA >60 11/09/2013 1139   GFRAA >60 08/22/2019 1519   GFRAA >60 05/12/2014 1041   GFRAA >60 11/09/2013 1139   Lab Results  Component Value Date   WBC 5.5 08/22/2019   HGB 12.7 08/22/2019   HCT 38.9 08/22/2019   MCV 88.0 08/22/2019   PLT 218 08/22/2019    Imaging Studies: US PELVIS TRANSVAGINAL NON-OB (TV ONLY)  Result Date:  02/27/2020 Patient Name: Natalie Gallagher DOB: 08-09-1970 MRN: [id number] ULTRASOUND REPORT Location: [facility] TECHNIQUE: Both transabdominal and transvaginal ultrasound examinations of the pelvis were performed. Transabdominal technique was performed for global imaging of the pelvis including uterus, ovaries, adnexal regions, and pelvic cul-de-sac. It was necessary to proceed with endovaginal exam following the transabdominal exam to visualize the endometrium and adnexa.  Indications:AUB  Findings: The uterus is anteverted and measures 7.6 x 3.6 x 4.6 cm. Echo texture is heterogenous with  evidence of focal masses, Within the uterus are multiple suspected fibroids measuring: Fibroid 1:Anterior Fundal IM 2.8x 2.7x 2.9 cm Fibroid 2:Pedunculated fundal 2.83x 2.0 x 1. Fibroid 3: Fundal Im 5.4 x 4.6 x 5.1 cm Endometrium is 5 mm Right Ovary measures 2.7 x 1.4 x 2.2 cm. It is normal in appearance. Left Ovary measures 2.8 x 1.8 x 2.3 cm. It is normal in appearance. Survey of the adnexa demonstrates no adnexal masses. There is no free fluid in the cul de sac. Impression: 1. Fibroid uterus. 2. Lt ovarian cyst has resolved. Recommendations: 1.Clinical correlation with the patient's History and Physical Exam. Jenine M. Albertine Grates    RDMS I have reviewed this study and agree with documented findings. Rubie Maid, MD [facility]   Assessment and Plan:   Natalie Gallagher is a 49 y.o. y/o female here for follow-up of lymphocytic colitis  Assessment and Plan: I had an extensive discussion with the patient in regard to her lymphocytic colitis.  Her colon was endoscopically abnormal appearing, which is not typical of lymphocytic colitis and biopsies confirmed lymphocytic colitis.  Given her ongoing symptoms despite extended treatment with budesonide (12 weeks before taper), with also combined cholestyramine, I have recommended repeat colonoscopy at this time to assess for treatment response.  However, patient  states she has no one to drive her to her colonoscopy and cannot schedule it at this time.  As per up-to-date, bismuth subsalicylate can be used as another treatment in patients who have not responded to the above.  I will therefore discontinue cholestyramine.  Patient states she is going out of town and may not pick it up today.  Stop cholestyramine as soon as she picks up the bismuth subsalicylate and this was discussed with her and she verbalized understanding.  If symptoms do not get better, I have advised her to schedule her colonoscopy again and she states she will work on this.  Her symptoms may not entirely be related to lymphocytic colitis at this point, but given her grossly abnormal appearing colon, repeat endoscopic evaluation would be needed  If symptoms continue despite above treatments, we may need to consider referral to tertiary care center as she may need biologic agents for refractory microscopic colitis  Follow Up  Instructions:    I discussed the assessment and treatment plan with the patient. The patient was provided an opportunity to ask questions and all were answered. The patient agreed with the plan and demonstrated an understanding of the instructions.   The patient was advised to call back or seek an in-person evaluation if the symptoms worsen or if the condition fails to improve as anticipated.  I provided 15 minutes of non-face-to-face time during this encounter. Additional time was spent in reviewing patient's chart, placing orders etc.   Virgel Manifold, MD  Speech recognition software was used to dictate this note.

## 2020-03-06 ENCOUNTER — Encounter: Payer: No Typology Code available for payment source | Admitting: Internal Medicine

## 2020-03-08 NOTE — Telephone Encounter (Signed)
  Please call patient.  Lymph nodes can swell slightly on the side of the injection.  Would choose to have the COVID injection on the other arm.  No known exemption for COVID-19 based on lymphedema.  M

## 2020-03-12 ENCOUNTER — Telehealth: Payer: Self-pay

## 2020-03-12 DIAGNOSIS — R1011 Right upper quadrant pain: Secondary | ICD-10-CM

## 2020-03-12 NOTE — Telephone Encounter (Signed)
Patient called wanting to let Dr. Bonna Gains know that she continues to have dark watery stools (black bean soup) after every meal regardless what she eats. Patient continues to have a black tongue. Patient denied any abdominal pain, chills, nausea, fever  and constipation. Patient wants to know if it could be her gallbladder giving her problems and maybe that's why she is going through this at this time. Please advise.

## 2020-03-13 ENCOUNTER — Other Ambulatory Visit: Payer: Self-pay

## 2020-03-13 DIAGNOSIS — K52832 Lymphocytic colitis: Secondary | ICD-10-CM

## 2020-03-13 MED ORDER — NA SULFATE-K SULFATE-MG SULF 17.5-3.13-1.6 GM/177ML PO SOLN: ORAL | 0 refills | Status: DC

## 2020-03-13 NOTE — Telephone Encounter (Signed)
Virgel Manifold, MD  You 3 hours ago (11:48 AM)   The Pepto Bismol can explain the change in color of her stools. We can get a RUQ U/S to evaluate her gallbladder. Again, we really would need repeat colonoscopy to reassess her lymphocytic colitis    Called patient back to let her know that Dr. Bonna Gains is recommending for her to have an Ultrasound to check her gallbladder, which patient agreed to have it done on 04/26/2020 at [facility] center. Patient also agreed on having her colonoscopy repeated on 05/04/2020.

## 2020-03-23 ENCOUNTER — Other Ambulatory Visit: Payer: Self-pay | Admitting: Gastroenterology

## 2020-04-09 ENCOUNTER — Telehealth: Payer: Self-pay | Admitting: Gastroenterology

## 2020-04-09 NOTE — Telephone Encounter (Signed)
Patient called and states due to transportation issues she wants to cancel procedure with Dr. Maximino Greenland for 1.28.22. Pt does not wish to resch at this time but wants to keep follow up on 2.3.22. FYI Please cancel pt procedure. Pt had video visit on 11.23.21.

## 2020-04-10 NOTE — Telephone Encounter (Signed)
Called Trish to let her know what patient request was.

## 2020-04-26 ENCOUNTER — Ambulatory Visit: Payer: Managed Care, Other (non HMO)

## 2020-04-26 ENCOUNTER — Encounter: Payer: Managed Care, Other (non HMO) | Admitting: Internal Medicine

## 2020-05-03 ENCOUNTER — Telehealth: Payer: Managed Care, Other (non HMO) | Admitting: Gastroenterology

## 2020-05-04 ENCOUNTER — Ambulatory Visit: Admit: 2020-05-04 | Payer: Managed Care, Other (non HMO) | Admitting: Gastroenterology

## 2020-05-04 SURGERY — COLONOSCOPY WITH PROPOFOL
Anesthesia: General

## 2020-05-07 ENCOUNTER — Encounter: Payer: Self-pay | Admitting: Hematology and Oncology

## 2020-05-10 ENCOUNTER — Encounter: Payer: Self-pay | Admitting: Gastroenterology

## 2020-05-10 ENCOUNTER — Telehealth (INDEPENDENT_AMBULATORY_CARE_PROVIDER_SITE_OTHER): Payer: Managed Care, Other (non HMO) | Admitting: Gastroenterology

## 2020-05-10 ENCOUNTER — Other Ambulatory Visit: Payer: Self-pay

## 2020-05-10 DIAGNOSIS — K52832 Lymphocytic colitis: Secondary | ICD-10-CM | POA: Diagnosis not present

## 2020-05-10 NOTE — Progress Notes (Signed)
Natalie Antigua, MD [address]  [place]  [place], [place] [address]  Main: [phone]  Fax: [phone]   Primary Care Physician: Glean Hess, MD  Virtual Visit via Telephone Note  I connected with patient on 05/10/20 at  1:30 PM EST by telephone and verified that I am speaking with the correct person using two identifiers.   I discussed the limitations, risks, security and privacy concerns of performing an evaluation and management service by telephone and the availability of in person appointments. I also discussed with the patient that there may be a patient responsible charge related to this service. The patient expressed understanding and agreed to proceed.  Location of Patient: Home Location of Provider: Home Persons involved: Patient and provider only during the visit (nursing staff and front desk staff was involved in communicating with the patient prior to the appointment, reviewing medications and checking them in)   History of Present Illness: Chief Complaint  Patient presents with  . Lymphocytic colitis     HPI: Natalie Gallagher is a 50 y.o. female here for follow-up of lymphocytic colitis.  Patient was scheduled for a colonoscopy given her significant lymphocytic colitis with gross findings on endoscopy and continued symptoms.  However, she canceled her scheduled date.  She states she has started taking herbal medications for her symptoms and with this she is having 3-4 bowel movements a day that are formed.  She reports good quality of life and states she does not have to run to the bathroom or be near a bathroom at all times.  She has previously been treated with budesonide, extended course of 12 weeks, cholestyramine, loperamide, bismuth subsalicylate, and continued to have symptoms despite different medications.  Suspect underlying functional symptoms besides her diagnosis of lymphocytic colitis.  Patient does not smoke.  Patient does not take any  NSAIDs, PPIs, SNRIs or any other risk factors for heart disease  Current Outpatient Medications  Medication Sig Dispense Refill  . Multiple Vitamins-Calcium (VIACTIV MULTI-VITAMIN PO) Take 2 tablets by mouth daily.     No current facility-administered medications for this visit.    Allergies as of 05/10/2020 - Review Complete 05/10/2020  Allergen Reaction Noted  . Tape  08/18/2012    Review of Systems:    All systems reviewed and negative except where noted in HPI.   Observations/Objective:  Labs: CMP     Component Value Date/Time   NA 138 08/22/2019 1519   NA 140 02/07/2019 0907   NA 144 05/12/2014 1041   K 3.9 08/22/2019 1519   K 3.8 05/12/2014 1041   CL 104 08/22/2019 1519   CL 106 05/12/2014 1041   CO2 25 08/22/2019 1519   CO2 32 05/12/2014 1041   GLUCOSE 83 08/22/2019 1519   GLUCOSE 75 05/12/2014 1041   BUN 15 08/22/2019 1519   BUN 12 02/07/2019 0907   BUN 14 05/12/2014 1041   CREATININE 0.84 08/22/2019 1519   CREATININE 0.93 05/12/2014 1041   CALCIUM 9.0 08/22/2019 1519   CALCIUM 8.7 05/12/2014 1041   PROT 7.6 08/22/2019 1519   PROT 6.8 02/07/2019 0907   PROT 7.2 05/12/2014 1041   ALBUMIN 3.7 08/22/2019 1519   ALBUMIN 4.4 02/07/2019 0907   ALBUMIN 3.1 (L) 05/12/2014 1041   AST 21 08/22/2019 1519   AST 9 (L) 05/12/2014 1041   ALT 19 08/22/2019 1519   ALT 21 05/12/2014 1041   ALKPHOS 61 08/22/2019 1519   ALKPHOS 59 05/12/2014 1041   BILITOT  0.3 08/22/2019 1519   BILITOT 0.2 02/07/2019 0907   BILITOT 0.2 05/12/2014 1041   GFRNONAA >60 08/22/2019 1519   GFRNONAA >60 05/12/2014 1041   GFRNONAA >60 11/09/2013 1139   GFRAA >60 08/22/2019 1519   GFRAA >60 05/12/2014 1041   GFRAA >60 11/09/2013 1139   Lab Results  Component Value Date   WBC 5.5 08/22/2019   HGB 12.7 08/22/2019   HCT 38.9 08/22/2019   MCV 88.0 08/22/2019   PLT 218 08/22/2019    Imaging Studies: No results found.  Assessment and Plan:   Natalie Gallagher is a 50 y.o. y/o female here  for follow-up of lymphocytic colitis  Assessment and Plan: I again discussed the need for repeat colonoscopy given her ongoing symptoms  This has been discussed with her on several previous visits.  She does not want to schedule at this time and states she will call us back once she is able to schedule  She is satisfied with her symptoms at this point and states it does not interfere with her daily life.  She was advised against herbal products due to unknown underlying adverse effects and if these occur she was advised to call us or her PCP and she is agreeable  Given that all above medications typically used for lymphocytic colitis did not help the patient, repeating therapy with these medications is unlikely to lead to good results, especially without colon biopsies to see if the disease is still present given that she completed treatment with budesonide already.  This was discussed with her again and she verbalized understanding and states she will call us back as soon as she is able to schedule  Follow Up Instructions:    I discussed the assessment and treatment plan with the patient. The patient was provided an opportunity to ask questions and all were answered. The patient agreed with the plan and demonstrated an understanding of the instructions.   The patient was advised to call back or seek an in-person evaluation if the symptoms worsen or if the condition fails to improve as anticipated.  I provided 12 minutes of non-face-to-face time during this encounter. Additional time was spent in reviewing patient's chart, placing orders etc.   Virgel Manifold, MD  Speech recognition software was used to dictate this note.

## 2020-05-15 ENCOUNTER — Other Ambulatory Visit: Payer: Self-pay

## 2020-05-15 ENCOUNTER — Telehealth: Payer: Self-pay | Admitting: *Deleted

## 2020-05-15 NOTE — Telephone Encounter (Signed)
Patient called reporting that she is scheduled for MRI/ Mammogram tomorrow and she needs nausea medicine called in prior to tomorrow. Please advise

## 2020-05-15 NOTE — Telephone Encounter (Signed)
  Please call patient and find out what nausea medicine.  Did she have this done before?  M

## 2020-05-16 ENCOUNTER — Ambulatory Visit: Admission: RE | Admit: 2020-05-16 | Payer: Managed Care, Other (non HMO) | Source: Ambulatory Visit

## 2020-05-16 MED ORDER — ONDANSETRON HCL 8 MG PO TABS: 4.0000 mg | ORAL_TABLET | Freq: Three times a day (TID) | ORAL | 0 refills | Status: DC | PRN

## 2020-05-16 MED ORDER — ONDANSETRON HCL 4 MG PO TABS: 4.0000 mg | ORAL_TABLET | Freq: Three times a day (TID) | ORAL | 0 refills | Status: DC | PRN

## 2020-05-24 ENCOUNTER — Ambulatory Visit: Payer: Managed Care, Other (non HMO) | Admitting: Hematology and Oncology

## 2020-05-24 ENCOUNTER — Other Ambulatory Visit: Payer: Managed Care, Other (non HMO)

## 2020-05-25 ENCOUNTER — Ambulatory Visit
Admission: RE | Admit: 2020-05-25 | Discharge: 2020-05-25 | Disposition: A | Discharge status: Home / Self Care | Payer: Managed Care, Other (non HMO) | Source: Ambulatory Visit | Attending: Hematology and Oncology | Admitting: Hematology and Oncology

## 2020-05-25 ENCOUNTER — Other Ambulatory Visit: Payer: Self-pay

## 2020-05-25 DIAGNOSIS — R928 Other abnormal and inconclusive findings on diagnostic imaging of breast: Secondary | ICD-10-CM | POA: Insufficient documentation

## 2020-05-25 MED ORDER — GADOBUTROL 1 MMOL/ML IV SOLN
7.0000 mL | Freq: Once | INTRAVENOUS | Status: AC | PRN
  Administered 2020-05-25: 7 mL via INTRAVENOUS

## 2020-05-28 NOTE — Progress Notes (Signed)
[facility]  [address] [place], [place] [address] Phone: [phone]  Fax: [phone]   Clinic Day:  05/29/2020  Referring physician: Glean Hess, MD  Chief Complaint: Natalie Gallagher is a 50 y.o. female with stage IA right breast cancer who is seen for 9 month assessment.   HPI: The patient was last seen in the medical oncology clinic on 08/22/2019 via telemedicine. At that time, she felt good.  Menses had returned. She denied any breast concerns.  She had loose stools. Colonoscopy was planned. Hematocrit was 38.9, hemoglobin 12.7, platelets 218,000, WBC 5,500. CMP was normal. CA27.29 was 17.3.  Colonoscopy on 09/07/2019 by Dr. Bonna Gains revealed altered vascular, discolored white looking mucosa in the transverse colon, descending colon, rectum, and sigmoid colon. Pathology showed increased intraepithelial lymphocytes consistent with lymphocytic/microscopic colitis.   The patient spoke to Dr. Bonna Gains on 02/28/2020 via telephone regarding her lymphocytic colitis. Repeat colonoscopy was recommended, but patient did not have transportation so she did not want to schedule it. She was switched from cholestyramine to bismuth subsalicylate.  Bilateral breast MRI with and without contrast on 05/25/2020 revealed stable post lumpectomy changes over the outer lower right breast with stable known seroma abutting the pectoralis muscle. Couple tiny foci of enhancement along the anterior border of the seroma without significant change. There were no suspicious masses or enhancement within the left breast.  During the interim, she has been "good." Her period returned for a few months last year but she has not had one since 12/2019. She is using an herbal remedy for her lymphocytic colitis but it is not really helping her symptoms.  The patient does not perform regular breast self exams. She massages her scar daily.   Past Medical History:  Diagnosis Date  .  Breast cancer ([place]) 2014   Chemo/radiaiton- Rt.  . Breast cancer of upper-outer quadrant of right female breast [facility]) March 2014   T1c,N0,M0; BRCA neg. ER: 60%; PR: 70%, her 2 neu not over expressed.   . Cancer [facility]) 2014   right breast.right wide excision with sentinal node biopsy on 06/21/12  . Colitis   . Lymphedema 2015   Right Arm.  . Personal history of chemotherapy   . Personal history of radiation therapy     Past Surgical History:  Procedure Laterality Date  . BREAST BIOPSY Right 2014   Positive  . BREAST BIOPSY Left 03/2019   MRI Biopsy, benign, cylindrical tissue marker clip   . BREAST BIOPSY Left 03/2019   MRI Biopsy, benign, Dumb-bell clip  . BREAST LUMPECTOMY Right 2014  . BREAST SURGERY Right 06/21/2012   right wide excision with sentinal node biopsy/axilary dissection.  . COLONOSCOPY WITH PROPOFOL N/A 10/01/2016   Procedure: COLONOSCOPY WITH PROPOFOL;  Surgeon: Robert Bellow, MD;  Location: [facility] ENDOSCOPY;  Service: Endoscopy;  Laterality: N/A;  . COLONOSCOPY WITH PROPOFOL N/A 09/07/2019   Procedure: COLONOSCOPY WITH PROPOFOL;  Surgeon: Virgel Manifold, MD;  Location: [facility] ENDOSCOPY;  Service: Endoscopy;  Laterality: N/A;  . STRABISMUS SURGERY      Family History  Problem Relation Age of Onset  . Cancer Paternal Uncle        Lung  . Cancer Cousin        Breast   . Cancer Maternal Aunt        lung  . Diabetes Mother   . Hypertension Father   . Diabetes Father   . Breast cancer Neg Hx   . Ovarian  cancer Neg Hx   . Colon cancer Neg Hx     Social History:  reports that she has never smoked. She has never used smokeless tobacco. She reports current alcohol use. She reports that she does not use drugs.  She is going to the gym.She lives in [place]. She is losing weight on Weight Watchers.She works at the [employer]. 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 She lives in [place].  The  patient is alone today.  Allergies:  Allergies  Allergen Reactions  . Tape     The patient showed a cutaneous reaction to Telfa applied beneath a Tegaderm dressing. Telfa appears to be the primary offensive material.    Current Medications: Current Outpatient Medications  Medication Sig Dispense Refill  . Boswellia Serrata (BOSWELLIA PO) Take by mouth.    . Multiple Vitamins-Calcium (VIACTIV MULTI-VITAMIN PO) Take 2 tablets by mouth daily.    . ondansetron (ZOFRAN) 4 MG tablet Take 1 tablet (4 mg total) by mouth every 8 (eight) hours as needed for nausea or vomiting. (Patient not taking: Reported on 05/29/2020) 20 tablet 0   No current facility-administered medications for this visit.    Review of Systems  Constitutional: Negative.  Negative for chills, diaphoresis, fever, malaise/fatigue and weight loss.       Feels "good."  HENT: Negative.  Negative for congestion, ear discharge, ear pain, hearing loss, nosebleeds, sinus pain, sore throat and tinnitus.   Eyes: Negative.  Negative for blurred vision and double vision.  Respiratory: Negative.  Negative for cough, hemoptysis, sputum production and shortness of breath.   Cardiovascular: Negative.  Negative for chest pain, palpitations, orthopnea, leg swelling and PND.  Gastrointestinal: Positive for diarrhea (2-3 bowel movements; watery). Negative for abdominal pain, blood in stool, constipation, heartburn, melena, nausea and vomiting.  Genitourinary: Negative.  Negative for dysuria, frequency, hematuria and urgency.  Musculoskeletal: Negative.  Negative for back pain, joint pain, myalgias and neck pain.  Skin: Negative.  Negative for itching and rash.  Neurological: Negative.  Negative for dizziness, tingling, tremors, sensory change, speech change, focal weakness, weakness and headaches.  Endo/Heme/Allergies: Negative.  Does not bruise/bleed easily.  Psychiatric/Behavioral: Negative.  Negative for depression and memory loss. The  patient is not nervous/anxious and does not have insomnia.   All other systems reviewed and are negative.  Performance status (ECOG): 0  Vitals Blood pressure 139/89, pulse 66, temperature (!) 97.3 F (36.3 C), temperature source Tympanic, resp. rate 18, weight 160 lb 15 oz (73 kg), last menstrual period 12/09/2012, SpO2 100 %.  Physical Exam Vitals and nursing note reviewed.  Constitutional:      General: She is not in acute distress.    Appearance: She is not diaphoretic.  HENT:     Head: Normocephalic.     Mouth/Throat:     Pharynx: No oropharyngeal exudate.  Eyes:     General: No scleral icterus.    Conjunctiva/sclera: Conjunctivae normal.     Pupils: Pupils are equal, round, and reactive to light.     Comments: Glasses.  Brown eyes.  Cardiovascular:     Rate and Rhythm: Normal rate and regular rhythm.     Heart sounds: Normal heart sounds. No murmur heard.   Pulmonary:     Effort: Pulmonary effort is normal. No respiratory distress.     Breath sounds: Normal breath sounds. No wheezing or rales.  Chest:  Breasts:     Right: Skin change (scar tissue; fibrocystic changes inferiorly.  Breast tissue is dense.) present. No inverted nipple, mass, nipple discharge, tenderness, axillary adenopathy or supraclavicular adenopathy.     Left: Skin change (fibrocystic changes inferiorly and superiorly.) present. No inverted nipple, mass, nipple discharge, tenderness, axillary adenopathy or supraclavicular adenopathy.    Abdominal:     General: Bowel sounds are normal. There is no distension.     Palpations: Abdomen is soft. There is no mass.     Tenderness: There is no abdominal tenderness. There is no guarding or rebound.  Musculoskeletal:        General: No tenderness. Normal range of motion.     Cervical back: Normal range of motion and neck supple.  Lymphadenopathy:     Head:     Right side of head: No preauricular, posterior auricular or occipital adenopathy.     Left side of  head: No preauricular, posterior auricular or occipital adenopathy.     Cervical: No cervical adenopathy.     Upper Body:     Right upper body: No supraclavicular or axillary adenopathy.     Left upper body: No supraclavicular or axillary adenopathy.     Lower Body: No right inguinal adenopathy. No left inguinal adenopathy.  Skin:    General: Skin is warm and dry.  Neurological:     Mental Status: She is alert and oriented to person, place, and time.  Psychiatric:        Mood and Affect: Mood and affect normal.        Cognition and Memory: Memory normal.        Judgment: Judgment normal.     Appointment on 05/29/2020  Component Date Value Ref Range Status  . Sodium 05/29/2020 139  135 - 145 mmol/L Final  . Potassium 05/29/2020 3.7  3.5 - 5.1 mmol/L Final  . Chloride 05/29/2020 104  98 - 111 mmol/L Final  . CO2 05/29/2020 24  22 - 32 mmol/L Final  . Glucose, Bld 05/29/2020 71  70 - 99 mg/dL Final   Glucose reference range applies only to samples taken after fasting for at least 8 hours.  . BUN 05/29/2020 16  6 - 20 mg/dL Final  . Creatinine, Ser 05/29/2020 0.74  0.44 - 1.00 mg/dL Final  . Calcium 05/29/2020 9.2  8.9 - 10.3 mg/dL Final  . Total Protein 05/29/2020 7.6  6.5 - 8.1 g/dL Final  . Albumin 05/29/2020 3.9  3.5 - 5.0 g/dL Final  . AST 05/29/2020 20  15 - 41 U/L Final  . ALT 05/29/2020 18  0 - 44 U/L Final  . Alkaline Phosphatase 05/29/2020 58  38 - 126 U/L Final  . Total Bilirubin 05/29/2020 0.4  0.3 - 1.2 mg/dL Final  . GFR, Estimated 05/29/2020 >60  >60 mL/min Final   Comment: (NOTE) Calculated using the CKD-EPI Creatinine Equation (2021)   . Anion gap 05/29/2020 11  5 - 15 Final   Performed at [facility], [address] [place], [place] [address]  . WBC 05/29/2020 5.4  4.0 - 10.5 K/uL Final  . RBC 05/29/2020 4.30  3.87 - 5.11 MIL/uL Final  . Hemoglobin 05/29/2020 12.0  12.0 - 15.0 g/dL Final  . HCT 05/29/2020 36.5  36.0 - 46.0 % Final  . MCV  05/29/2020 84.9  80.0 - 100.0 fL Final  . MCH 05/29/2020 27.9  26.0 - 34.0 pg Final  . MCHC 05/29/2020 32.9  30.0 - 36.0 g/dL Final  . RDW 05/29/2020 14.2  11.5 - 15.5 % Final  .  Platelets 05/29/2020 246  150 - 400 K/uL Final  . nRBC 05/29/2020 0.0  0.0 - 0.2 % Final  . Neutrophils Relative % 05/29/2020 54  % Final  . Neutro Abs 05/29/2020 2.9  1.7 - 7.7 K/uL Final  . Lymphocytes Relative 05/29/2020 36  % Final  . Lymphs Abs 05/29/2020 2.0  0.7 - 4.0 K/uL Final  . Monocytes Relative 05/29/2020 8  % Final  . Monocytes Absolute 05/29/2020 0.4  0.1 - 1.0 K/uL Final  . Eosinophils Relative 05/29/2020 2  % Final  . Eosinophils Absolute 05/29/2020 0.1  0.0 - 0.5 K/uL Final  . Basophils Relative 05/29/2020 0  % Final  . Basophils Absolute 05/29/2020 0.0  0.0 - 0.1 K/uL Final  . Immature Granulocytes 05/29/2020 0  % Final  . Abs Immature Granulocytes 05/29/2020 0.01  0.00 - 0.07 K/uL Final   Performed at [facility], [address] [place], [place] [address]    Assessment:  Wilma Michaelson is a 50 y.o. female with stage IA right breast invasive mammary carcinomastatus post wide excision and axillary lymph node dissection on 06/21/2012. Pathology revealed a 1.7 cm grade III ductal carcinoma. Thirteen lymph nodes were negative. Tumor was ER/PR positive and HER-2/neu negative. BRCA 1 and 2 testing was negative. Oncotype DXwas 29 (high) which translates into a distant recurrence of 19% at 10 yrs with tamoxifen alone.  The patient received 4 cycles ofAdriamycin and Cytoxan(08/17/2012 - 10/19/2012) followed by 12 weeks of Taxol(11/19/2012 - 01/25/2013). She completed local radiationon 04/20/2013. Shewas onTamoxifenfrom01/15/2015 - 04/21/2018.   CA27.29has been followed: 17 on 01/12/2015, 18.6 on 07/27/2015, 14.9 on 01/28/2016, 13.9 on 08/21/2016, 15.1 on 02/03/2017, 17.8 on 08/17/2017, 13.3 on 02/04/2018, and 11.2 on 08/12/2018.  Bilateral mammogramon 08/11/2016 revealed  no evidence of malignancy.Bilateral breast MRIon 04/09/2017 demonstrated no evidence of malignancy. Bilateral mammogramon 08/13/2017 revealing no mammographic evidence of malignancy in either breast. There were stable post lumpectomy changes noted in the RIGHT breast, including a far posterior seroma.   Bilateral screening mammogram on 08/18/2018 revealed no evidence of malignancy. Bilateral breast MRI on 08/19/2018 revealed an indeterminate irregular enhancing mass in the upper inner left breast and indeterminate clumped non mass enhancement within the anteromedial left breast. Bilateral breast MRI on 03/24/2019 revealed 2 sites of enhancement in the medial left breast that were felt to represent more fat necrosis, similar in appearance to the 08/19/2018 and 09/06/2018 MRI findings. These regions of enhancement largely correlated with fat on the T1 images. There was no MRI evidence of malignancy in the right breast or elsewhere in the left breast.  Bilateral diagnostic mammogram and ultrasound on 08/19/2019 revealed no mammographic or sonographic evidence of malignancy or other abnormal finding at the small area of discoloration in the lateral  right breast.  There was no mammographic evidence of malignancy in the left breast.  Bilateral breast MRI on 05/25/2020 revealed stable post lumpectomy changes over the outer lower right breast with stable known seroma abutting the pectoralis muscle. Couple tiny foci of enhancement along the anterior border of the seroma without significant change. There were no suspicious masses or enhancement within the left breast.  Left breast biopsy on 09/06/2018 revealed fat necrosis with no evidence of malignancy in both locations.   Hormone levelson 08/21/2017 revealed an estradiol level of 22.8 pg/mL and an FSH of 34.9 mIU/mL, both of which are consistent with a post-menopausal state.   Breast cancer index (BCI)on 09/07/2017 demonstrated a a high risk of recurrence  at  8.3% (3.3% - 13.1%), but a low likelihood of benefit from extended endocrine therapy.  She has loose stools.  Colonoscopy on 09/07/2019 revealed altered vascular, discolored white looking mucosa in the transverse colon, descending colon, rectum, and sigmoid colon. Pathology showed increased intraepithelial lymphocytes consistent with lymphocytic/microscopic colitis. She was switched from cholestyramine to bismuth subsalicylate.  She has tried an herbal remedy without much improvement in symptoms.  The patient does not want to get the COVID-19 vaccine.  Symptomatically, she feels "good."  He denies any breast concerns.  Exam is stable.  Plan: 1.   Labs today: CBC with diff, CMP, CA27.29 2.  Stage IA right breastcancer              Clinically, she continues to do well.             She denies any breast concerns.  Encourage monthly breast exams.             Bilateral breast MRI on 05/25/2020 was personally reviewed.  Agree with radiology findings.   There are stable post lumpectomy changes over the outer lower right breast with a stable seroma abutting the pectoralis muscle.    There were no suspicious masses or enhancement within the left breast.             She completed 5 years of endocrine therapy.             She declined extended adjuvant endocrine therapy.             Continue yearly mammogram.. 3.   Bilateral screening mammogram on 11/21/2020. 4.   RTC after mammogram for MD assess, labs (CBC with diff, CMP, FSH, estradiol, CA27.29) and review of mammogram.  I discussed the assessment and treatment plan with the patient.  The patient was provided an opportunity to ask questions and all were answered.  The patient agreed with the plan and demonstrated an understanding of the instructions.  The patient was advised to call back if the symptoms worsen or if the condition fails to improve as anticipated.  I provided 24 minutes of face-to-face time during this this encounter and > 50% was  spent counseling as documented under my assessment and plan.  An additional 10 minutes were spent reviewing her chart (Epic and Care Everywhere) including notes, labs, and imaging studies.    Lequita Asal, MD, PhD    05/29/2020, 4:18 PM  I, Mirian Mo Tufford, am acting as Education administrator for [employer]. Mike Gip, MD, PhD.  I, Vivian Neuwirth C. Mike Gip, MD, have reviewed the above documentation for accuracy and completeness, and I agree with the above. Marland Kitchen

## 2020-05-29 ENCOUNTER — Encounter: Payer: Self-pay | Admitting: Hematology and Oncology

## 2020-05-29 ENCOUNTER — Inpatient Hospital Stay: Payer: Managed Care, Other (non HMO) | Attending: Hematology and Oncology

## 2020-05-29 ENCOUNTER — Other Ambulatory Visit: Payer: Self-pay

## 2020-05-29 ENCOUNTER — Inpatient Hospital Stay (HOSPITAL_BASED_OUTPATIENT_CLINIC_OR_DEPARTMENT_OTHER): Payer: Managed Care, Other (non HMO) | Admitting: Hematology and Oncology

## 2020-05-29 VITALS — BP 139/89 | HR 66 | Temp 97.3°F | Resp 18 | Wt 160.9 lb

## 2020-05-29 DIAGNOSIS — Z79899 Other long term (current) drug therapy: Secondary | ICD-10-CM | POA: Insufficient documentation

## 2020-05-29 DIAGNOSIS — K52832 Lymphocytic colitis: Secondary | ICD-10-CM | POA: Insufficient documentation

## 2020-05-29 DIAGNOSIS — Z801 Family history of malignant neoplasm of trachea, bronchus and lung: Secondary | ICD-10-CM | POA: Insufficient documentation

## 2020-05-29 DIAGNOSIS — Z853 Personal history of malignant neoplasm of breast: Secondary | ICD-10-CM

## 2020-05-29 DIAGNOSIS — R928 Other abnormal and inconclusive findings on diagnostic imaging of breast: Secondary | ICD-10-CM

## 2020-05-29 DIAGNOSIS — Z888 Allergy status to other drugs, medicaments and biological substances status: Secondary | ICD-10-CM | POA: Diagnosis not present

## 2020-05-29 DIAGNOSIS — R197 Diarrhea, unspecified: Secondary | ICD-10-CM | POA: Insufficient documentation

## 2020-05-29 DIAGNOSIS — Z9221 Personal history of antineoplastic chemotherapy: Secondary | ICD-10-CM | POA: Diagnosis not present

## 2020-05-29 DIAGNOSIS — C50511 Malignant neoplasm of lower-outer quadrant of right female breast: Secondary | ICD-10-CM | POA: Diagnosis not present

## 2020-05-29 DIAGNOSIS — Z803 Family history of malignant neoplasm of breast: Secondary | ICD-10-CM | POA: Diagnosis not present

## 2020-05-29 DIAGNOSIS — N6489 Other specified disorders of breast: Secondary | ICD-10-CM | POA: Insufficient documentation

## 2020-05-29 DIAGNOSIS — Z833 Family history of diabetes mellitus: Secondary | ICD-10-CM | POA: Diagnosis not present

## 2020-05-29 DIAGNOSIS — Z923 Personal history of irradiation: Secondary | ICD-10-CM | POA: Insufficient documentation

## 2020-05-29 DIAGNOSIS — N6322 Unspecified lump in the left breast, upper inner quadrant: Secondary | ICD-10-CM | POA: Diagnosis not present

## 2020-05-29 DIAGNOSIS — Z170 Estrogen receptor positive status [ER+]: Secondary | ICD-10-CM | POA: Diagnosis not present

## 2020-05-29 DIAGNOSIS — Z8249 Family history of ischemic heart disease and other diseases of the circulatory system: Secondary | ICD-10-CM | POA: Insufficient documentation

## 2020-05-29 LAB — COMPREHENSIVE METABOLIC PANEL
ALT: 18 U/L (ref 0–44)
AST: 20 U/L (ref 15–41)
Albumin: 3.9 g/dL (ref 3.5–5.0)
Alkaline Phosphatase: 58 U/L (ref 38–126)
Anion gap: 11 (ref 5–15)
BUN: 16 mg/dL (ref 6–20)
CO2: 24 mmol/L (ref 22–32)
Calcium: 9.2 mg/dL (ref 8.9–10.3)
Chloride: 104 mmol/L (ref 98–111)
Creatinine, Ser: 0.74 mg/dL (ref 0.44–1.00)
GFR, Estimated: >60 mL/min (ref >60)
Glucose, Bld: 71 mg/dL (ref 70–99)
Potassium: 3.7 mmol/L (ref 3.5–5.1)
Sodium: 139 mmol/L (ref 135–145)
Total Bilirubin: 0.4 mg/dL (ref 0.3–1.2)
Total Protein: 7.6 g/dL (ref 6.5–8.1)

## 2020-05-29 LAB — CBC WITH DIFFERENTIAL/PLATELET
Abs Immature Granulocytes: 0.01 10*3/uL (ref 0.00–0.07)
Basophils Absolute: 0 10*3/uL (ref 0.0–0.1)
Basophils Relative: 0 %
Eosinophils Absolute: 0.1 10*3/uL (ref 0.0–0.5)
Eosinophils Relative: 2 %
HCT: 36.5 % (ref 36.0–46.0)
Hemoglobin: 12 g/dL (ref 12.0–15.0)
Immature Granulocytes: 0 %
Lymphocytes Relative: 36 %
Lymphs Abs: 2 10*3/uL (ref 0.7–4.0)
MCH: 27.9 pg (ref 26.0–34.0)
MCHC: 32.9 g/dL (ref 30.0–36.0)
MCV: 84.9 fL (ref 80.0–100.0)
Monocytes Absolute: 0.4 10*3/uL (ref 0.1–1.0)
Monocytes Relative: 8 %
Neutro Abs: 2.9 10*3/uL (ref 1.7–7.7)
Neutrophils Relative %: 54 %
Platelets: 246 10*3/uL (ref 150–400)
RBC: 4.3 MIL/uL (ref 3.87–5.11)
RDW: 14.2 % (ref 11.5–15.5)
WBC: 5.4 10*3/uL (ref 4.0–10.5)
nRBC: 0 % (ref 0.0–0.2)

## 2020-05-30 LAB — CANCER ANTIGEN 27.29: CA 27.29: 15 U/mL (ref 0.0–38.6)

## 2020-07-31 DIAGNOSIS — K52832 Lymphocytic colitis: Secondary | ICD-10-CM | POA: Insufficient documentation

## 2020-07-31 NOTE — Progress Notes (Signed)
Date:  08/01/2020   Name:  Natalie Gallagher   DOB:  1970/07/04   MRN:  [id number]   Chief Complaint: Annual Exam (Breast exam no pap) Natalie Gallagher is a 50 y.o. female who presents today for her Complete Annual Exam. She feels well. She reports exercising step aerobics, walking and working out X3 days a week. She reports she is sleeping well. Breast complaints none.  Mammogram: 08/2019 scheduled 8/22 DEXA: none Pap smear: 02/03/2017 neg with cotesting Colonoscopy: 09/2019 - lymphocytic colitis; has declined follow up colonoscopy  There is no immunization history on file for this patient.   Diarrhea  This is a chronic problem. The problem occurs 2 to 4 times per day. The problem has been unchanged. The stool consistency is described as watery. The patient states that diarrhea does not awaken her from sleep. Pertinent negatives include no abdominal pain, arthralgias, chills, coughing, fever, headaches or vomiting. colitis by colonoscopy    Lab Results  Component Value Date   CREATININE 0.74 05/29/2020   BUN 16 05/29/2020   NA 139 05/29/2020   K 3.7 05/29/2020   CL 104 05/29/2020   CO2 24 05/29/2020   Lab Results  Component Value Date   CHOL 187 02/07/2019   HDL 69 02/07/2019   LDLCALC 104 (H) 02/07/2019   TRIG 74 02/07/2019   CHOLHDL 2.7 02/07/2019   Lab Results  Component Value Date   TSH 2.110 02/07/2019   Lab Results  Component Value Date   HGBA1C 5.1 02/03/2017   Lab Results  Component Value Date   WBC 5.4 05/29/2020   HGB 12.0 05/29/2020   HCT 36.5 05/29/2020   MCV 84.9 05/29/2020   PLT 246 05/29/2020   Lab Results  Component Value Date   ALT 18 05/29/2020   AST 20 05/29/2020   ALKPHOS 58 05/29/2020   BILITOT 0.4 05/29/2020     Review of Systems  Constitutional: Negative for chills, fatigue and fever.  HENT: Negative for congestion, hearing loss, tinnitus, trouble swallowing and voice change.   Eyes: Negative for visual disturbance.  Respiratory:  Negative for cough, chest tightness, shortness of breath and wheezing.   Cardiovascular: Negative for chest pain, palpitations and leg swelling.  Gastrointestinal: Positive for diarrhea (watery stools 2-3 per day). Negative for abdominal pain, anal bleeding, constipation, rectal pain and vomiting.  Endocrine: Negative for polydipsia and polyuria.  Genitourinary: Negative for dysuria, frequency, genital sores, vaginal bleeding and vaginal discharge.  Musculoskeletal: Negative for arthralgias, gait problem and joint swelling.  Skin: Negative for color change and rash.  Neurological: Negative for dizziness, tremors, light-headedness and headaches.  Hematological: Negative for adenopathy. Does not bruise/bleed easily.  Psychiatric/Behavioral: Negative for dysphoric mood and sleep disturbance. The patient is not nervous/anxious.     Patient Active Problem List   Diagnosis Date Noted  . Lymphocytic colitis 07/31/2020  . Abnormal MRI, breast 03/29/2019  . Intramural leiomyoma of uterus 02/12/2019  . Lymphedema of right upper extremity 09/29/2017  . History of breast cancer in female 07/20/2013  . BMI 29.0-29.9,adult 11/28/2011    Allergies  Allergen Reactions  . Tape     The patient showed a cutaneous reaction to Telfa applied beneath a Tegaderm dressing. Telfa appears to be the primary offensive material.    Past Surgical History:  Procedure Laterality Date  . BREAST BIOPSY Right 2014   Positive  . BREAST BIOPSY Left 03/2019   MRI Biopsy, benign, cylindrical tissue marker clip   . BREAST BIOPSY Left  03/2019   MRI Biopsy, benign, Dumb-bell clip  . BREAST LUMPECTOMY Right 2014  . BREAST SURGERY Right 06/21/2012   right wide excision with sentinal node biopsy/axilary dissection.  . COLONOSCOPY WITH PROPOFOL N/A 10/01/2016   Procedure: COLONOSCOPY WITH PROPOFOL;  Surgeon: Robert Bellow, MD;  Location: [facility] ENDOSCOPY;  Service: Endoscopy;  Laterality: N/A;  . COLONOSCOPY WITH  PROPOFOL N/A 09/07/2019   Procedure: COLONOSCOPY WITH PROPOFOL;  Surgeon: Virgel Manifold, MD;  Location: [facility] ENDOSCOPY;  Service: Endoscopy;  Laterality: N/A;  . STRABISMUS SURGERY      Social History   Tobacco Use  . Smoking status: Never Smoker  . Smokeless tobacco: Never Used  Vaping Use  . Vaping Use: Never used  Substance Use Topics  . Alcohol use: Yes    Comment: rare  . Drug use: No     Medication list has been reviewed and updated.  Current Meds  Medication Sig  . Boswellia Serrata (BOSWELLIA PO) Take by mouth.  . Multiple Vitamins-Calcium (VIACTIV MULTI-VITAMIN PO) Take 2 tablets by mouth daily.    PHQ 2/9 Scores 08/01/2020 06/20/2019 02/07/2019 12/15/2018  PHQ - 2 Score 0 0 0 0  PHQ- 9 Score 0 0 - -    GAD 7 : Generalized Anxiety Score 08/01/2020  Nervous, Anxious, on Edge 0  Control/stop worrying 0  Worry too much - different things 0  Trouble relaxing 0  Restless 0  Easily annoyed or irritable 0  Afraid - awful might happen 0  Total GAD 7 Score 0    BP Readings from Last 3 Encounters:  08/01/20 118/84  05/29/20 139/89  02/23/20 122/60    Physical Exam Vitals and nursing note reviewed.  Constitutional:      General: She is not in acute distress.    Appearance: She is well-developed.  HENT:     Head: Normocephalic and atraumatic.     Right Ear: Tympanic membrane and ear canal normal.     Left Ear: Tympanic membrane and ear canal normal.     Nose:     Right Sinus: No maxillary sinus tenderness.     Left Sinus: No maxillary sinus tenderness.  Eyes:     General: No scleral icterus.       Right eye: No discharge.        Left eye: No discharge.     Conjunctiva/sclera: Conjunctivae normal.  Neck:     Thyroid: No thyromegaly.     Vascular: No carotid bruit.  Cardiovascular:     Rate and Rhythm: Normal rate and regular rhythm.     Pulses: Normal pulses.     Heart sounds: Normal heart sounds.  Pulmonary:     Effort: Pulmonary effort is normal.  No respiratory distress.     Breath sounds: No wheezing.  Chest:  Breasts:     Right: Skin change (lumpectomy scars healed/tissues firm from prior breast cancer treatment) present. No mass, nipple discharge or tenderness.     Left: Skin change (healed biopsy scar) present. No mass, nipple discharge or tenderness.     Abdominal:     General: Bowel sounds are normal.     Palpations: Abdomen is soft.     Tenderness: There is no abdominal tenderness.  Musculoskeletal:     Cervical back: Normal range of motion. No erythema.     Right lower leg: No edema.     Left lower leg: No edema.  Lymphadenopathy:     Cervical: No cervical adenopathy.  Skin:  General: Skin is warm and dry.     Capillary Refill: Capillary refill takes less than 2 seconds.     Findings: No rash.  Neurological:     General: No focal deficit present.     Mental Status: She is alert and oriented to person, place, and time.     Cranial Nerves: No cranial nerve deficit.     Sensory: No sensory deficit.     Deep Tendon Reflexes: Reflexes are normal and symmetric.  Psychiatric:        Attention and Perception: Attention normal.        Mood and Affect: Mood normal.        Behavior: Behavior normal.     Wt Readings from Last 3 Encounters:  08/01/20 157 lb (71.2 kg)  05/29/20 160 lb 15 oz (73 kg)  02/23/20 158 lb 4.8 oz (71.8 kg)    BP 118/84   Pulse 68   Temp 98.3 F (36.8 C) (Oral)   Ht 5\' 1"  (1.549 m)   Wt 157 lb (71.2 kg)   LMP 12/09/2012 Comment: Hcg  SpO2 100%   BMI 29.66 kg/m   Assessment and Plan: 1. Annual physical exam Normal exam; continue healthy diet, exercise and weight control Pt declines all vaccinations - Lipid panel - TSH  2. Need for hepatitis C screening test - Hepatitis C antibody  3. History of breast cancer in female Followed by Oncology Completed chemo and XRT 2015 and completed 5 yrs Tamoxifen 2020  4. Lymphocytic colitis Pt declines repeat colonoscopy at this  time She felt she may have had the most benefit from [place] and would like to try this again She is aware that she can contact GI at any time for worsening sx or if she changes her mind about the colonoscopy - cholestyramine (QUESTRAN) 4 g packet; Take 1 packet (4 g total) by mouth 4 (four) times daily.  Dispense: 120 packet; Refill: 5   Partially dictated using Editor, commissioning. Any errors are unintentional.  Halina Maidens, MD [facility]  08/01/2020

## 2020-08-01 ENCOUNTER — Encounter: Payer: Self-pay | Admitting: Internal Medicine

## 2020-08-01 ENCOUNTER — Other Ambulatory Visit: Payer: Self-pay

## 2020-08-01 ENCOUNTER — Ambulatory Visit (INDEPENDENT_AMBULATORY_CARE_PROVIDER_SITE_OTHER): Payer: Managed Care, Other (non HMO) | Admitting: Internal Medicine

## 2020-08-01 VITALS — BP 118/84 | HR 68 | Temp 98.3°F | Ht 61.0 in | Wt 157.0 lb

## 2020-08-01 DIAGNOSIS — Z0000 Encounter for general adult medical examination without abnormal findings: Secondary | ICD-10-CM

## 2020-08-01 DIAGNOSIS — Z1159 Encounter for screening for other viral diseases: Secondary | ICD-10-CM | POA: Diagnosis not present

## 2020-08-01 DIAGNOSIS — K52832 Lymphocytic colitis: Secondary | ICD-10-CM

## 2020-08-01 DIAGNOSIS — Z853 Personal history of malignant neoplasm of breast: Secondary | ICD-10-CM | POA: Diagnosis not present

## 2020-08-01 MED ORDER — CHOLESTYRAMINE 4 G PO PACK: 4.0000 g | PACK | Freq: Four times a day (QID) | ORAL | 5 refills | Status: DC

## 2020-08-20 ENCOUNTER — Other Ambulatory Visit: Payer: Self-pay

## 2020-08-20 DIAGNOSIS — R9389 Abnormal findings on diagnostic imaging of other specified body structures: Secondary | ICD-10-CM

## 2020-08-20 DIAGNOSIS — N950 Postmenopausal bleeding: Secondary | ICD-10-CM

## 2020-08-21 ENCOUNTER — Telehealth: Payer: Self-pay | Admitting: Certified Nurse Midwife

## 2020-08-21 NOTE — Telephone Encounter (Signed)
Patient called stating that she received a message yesterday that her [place] had been rescheduled- pt was not happy with the change and asked to cancel stating that she works in [place] and can not go to [employer]. I offered to have [place] rescheduled closer and more convinent for her schedule and she declined. I had moved her apt to follow up with michelle as pt was thinking michelle had already left practice. After pt decided to cancel [place] she also requested to cancel the follow up with michelle. Pt states that she will let us know if she changes her mind, made her aware that I will do whatever I can to help.

## 2020-08-30 ENCOUNTER — Ambulatory Visit (HOSPITAL_COMMUNITY): Payer: Managed Care, Other (non HMO)

## 2020-08-30 ENCOUNTER — Encounter: Payer: Managed Care, Other (non HMO) | Admitting: Certified Nurse Midwife

## 2020-08-30 ENCOUNTER — Other Ambulatory Visit: Payer: Managed Care, Other (non HMO)

## 2020-09-14 ENCOUNTER — Encounter: Payer: Managed Care, Other (non HMO) | Admitting: Certified Nurse Midwife

## 2020-11-22 ENCOUNTER — Other Ambulatory Visit: Payer: Managed Care, Other (non HMO)

## 2020-11-22 ENCOUNTER — Ambulatory Visit: Payer: Managed Care, Other (non HMO) | Admitting: Oncology

## 2021-08-05 ENCOUNTER — Encounter: Payer: Self-pay | Admitting: Internal Medicine

## 2021-08-05 ENCOUNTER — Ambulatory Visit (INDEPENDENT_AMBULATORY_CARE_PROVIDER_SITE_OTHER): Payer: Managed Care, Other (non HMO) | Admitting: Internal Medicine

## 2021-08-05 VITALS — BP 124/80 | HR 66 | Ht 61.0 in | Wt 156.2 lb

## 2021-08-05 DIAGNOSIS — Z0000 Encounter for general adult medical examination without abnormal findings: Secondary | ICD-10-CM | POA: Diagnosis not present

## 2021-08-05 DIAGNOSIS — Z853 Personal history of malignant neoplasm of breast: Secondary | ICD-10-CM | POA: Diagnosis not present

## 2021-08-05 DIAGNOSIS — Z1159 Encounter for screening for other viral diseases: Secondary | ICD-10-CM | POA: Diagnosis not present

## 2021-08-05 NOTE — Progress Notes (Signed)
? ? ?Date:  08/05/2021  ? ?Name:  Natalie Gallagher   DOB:  1970/05/10   MRN:  [id number] ? ? ?Chief Complaint: Annual Exam ?Natalie Gallagher is a 51 y.o. female who presents today for her Complete Annual Exam. She feels well. She reports exercising hiking and walking. She reports she is sleeping well. Breast complaints - none. ? ?Mammogram: 08/2020 - schedule at [facility] ?DEXA: none ?Pap smear: 01/2017 repeat 2023 with GYN in May ?Colonoscopy: 09/2019 ? ?Health Maintenance Due  ?Topic Date Due  ? Hepatitis C Screening  Never done  ? TETANUS/TDAP  Never done  ? Zoster Vaccines- Shingrix (1 of 2) Never done  ? MAMMOGRAM  08/18/2020  ?  ? ?There is no immunization history on file for this patient. ? ?HPI ? ?Lab Results  ?Component Value Date  ? NA 139 05/29/2020  ? K 3.7 05/29/2020  ? CO2 24 05/29/2020  ? GLUCOSE 71 05/29/2020  ? BUN 16 05/29/2020  ? CREATININE 0.74 05/29/2020  ? CALCIUM 9.2 05/29/2020  ? GFRNONAA >60 05/29/2020  ? ?Lab Results  ?Component Value Date  ? CHOL 187 02/07/2019  ? HDL 69 02/07/2019  ? LDLCALC 104 (H) 02/07/2019  ? TRIG 74 02/07/2019  ? CHOLHDL 2.7 02/07/2019  ? ?Lab Results  ?Component Value Date  ? TSH 2.110 02/07/2019  ? ?Lab Results  ?Component Value Date  ? HGBA1C 5.1 02/03/2017  ? ?Lab Results  ?Component Value Date  ? WBC 5.4 05/29/2020  ? HGB 12.0 05/29/2020  ? HCT 36.5 05/29/2020  ? MCV 84.9 05/29/2020  ? PLT 246 05/29/2020  ? ?Lab Results  ?Component Value Date  ? ALT 18 05/29/2020  ? AST 20 05/29/2020  ? ALKPHOS 58 05/29/2020  ? BILITOT 0.4 05/29/2020  ? ?No results found for: 25OHVITD2, [place], VD25OH  ? ?Review of Systems  ?Constitutional:  Negative for chills, fatigue and fever.  ?HENT:  Negative for congestion, hearing loss, tinnitus, trouble swallowing and voice change.   ?Eyes:  Negative for visual disturbance.  ?Respiratory:  Negative for cough, chest tightness, shortness of breath and wheezing.   ?Cardiovascular:  Negative for chest pain, palpitations and leg swelling.  ?Gastrointestinal:   Negative for abdominal pain, constipation, diarrhea and vomiting.  ?Endocrine: Negative for polydipsia and polyuria.  ?Genitourinary:  Negative for dysuria, frequency, genital sores, vaginal bleeding and vaginal discharge.  ?Musculoskeletal:  Negative for arthralgias, gait problem and joint swelling.  ?Skin:  Negative for color change and rash.  ?Neurological:  Negative for dizziness, tremors, light-headedness and headaches.  ?Hematological:  Negative for adenopathy. Does not bruise/bleed easily.  ?Psychiatric/Behavioral:  Negative for dysphoric mood and sleep disturbance. The patient is not nervous/anxious.   ? ?Patient Active Problem List  ? Diagnosis Date Noted  ? Lymphocytic colitis 07/31/2020  ? Abnormal MRI, breast 03/29/2019  ? Intramural leiomyoma of uterus 02/12/2019  ? Lymphedema of right upper extremity 09/29/2017  ? History of breast cancer in female 07/20/2013  ? BMI 29.0-29.9,adult 11/28/2011  ? ? ?Allergies  ?Allergen Reactions  ? Tape   ?  The patient showed a cutaneous reaction to Telfa applied beneath a Tegaderm dressing. Telfa appears to be the primary offensive material.  ? ? ?Past Surgical History:  ?Procedure Laterality Date  ? BREAST BIOPSY Right 2014  ? Positive  ? BREAST BIOPSY Left 03/2019  ? MRI Biopsy, benign, cylindrical tissue marker clip   ? BREAST BIOPSY Left 03/2019  ? MRI Biopsy, benign, Dumb-bell clip  ? BREAST LUMPECTOMY Right  2014  ? BREAST SURGERY Right 06/21/2012  ? right wide excision with sentinal node biopsy/axilary dissection.  ? COLONOSCOPY WITH PROPOFOL N/A 10/01/2016  ? Procedure: COLONOSCOPY WITH PROPOFOL;  Surgeon: Robert Bellow, MD;  Location: [facility] ENDOSCOPY;  Service: Endoscopy;  Laterality: N/A;  ? COLONOSCOPY WITH PROPOFOL N/A 09/07/2019  ? Procedure: COLONOSCOPY WITH PROPOFOL;  Surgeon: Virgel Manifold, MD;  Location: [facility] ENDOSCOPY;  Service: Endoscopy;  Laterality: N/A;  ? STRABISMUS SURGERY    ? ? ?Social History  ? ?Tobacco Use  ? Smoking status: Never   ? Smokeless tobacco: Never  ?Vaping Use  ? Vaping Use: Never used  ?Substance Use Topics  ? Alcohol use: Yes  ?  Comment: rare  ? Drug use: No  ? ? ? ?Medication list has been reviewed and updated. ? ?Current Meds  ?Medication Sig  ? Multiple Vitamins-Calcium (VIACTIV MULTI-VITAMIN PO) Take 2 tablets by mouth daily.  ? ? ? ?  08/05/2021  ?  8:40 AM 08/01/2020  ?  8:43 AM  ?GAD 7 : Generalized Anxiety Score  ?Nervous, Anxious, on Edge 0 0  ?Control/stop worrying 0 0  ?Worry too much - different things 0 0  ?Trouble relaxing 0 0  ?Restless 0 0  ?Easily annoyed or irritable 0 0  ?Afraid - awful might happen 0 0  ?Total GAD 7 Score 0 0  ?Anxiety Difficulty Not difficult at all   ? ? ? ?  08/05/2021  ?  8:40 AM  ?Depression screen PHQ 2/9  ?Decreased Interest 0  ?Down, Depressed, Hopeless 0  ?PHQ - 2 Score 0  ?Altered sleeping 0  ?Tired, decreased energy 0  ?Change in appetite 0  ?Feeling bad or failure about yourself  0  ?Trouble concentrating 0  ?Moving slowly or fidgety/restless 0  ?Suicidal thoughts 0  ?PHQ-9 Score 0  ?Difficult doing work/chores Not difficult at all  ? ? ?BP Readings from Last 3 Encounters:  ?08/05/21 124/80  ?08/01/20 118/84  ?05/29/20 139/89  ? ? ?Physical Exam ?Vitals and nursing note reviewed.  ?Constitutional:   ?   General: She is not in acute distress. ?   Appearance: She is well-developed.  ?HENT:  ?   Head: Normocephalic and atraumatic.  ?   Right Ear: Tympanic membrane and ear canal normal.  ?   Left Ear: Tympanic membrane and ear canal normal.  ?   Nose:  ?   Right Sinus: No maxillary sinus tenderness.  ?   Left Sinus: No maxillary sinus tenderness.  ?Eyes:  ?   General: No scleral icterus.    ?   Right eye: No discharge.     ?   Left eye: No discharge.  ?   Conjunctiva/sclera: Conjunctivae normal.  ?Neck:  ?   Thyroid: No thyromegaly.  ?   Vascular: No carotid bruit.  ?Cardiovascular:  ?   Rate and Rhythm: Normal rate and regular rhythm.  ?   Pulses: Normal pulses.  ?   Heart sounds: Normal  heart sounds.  ?Pulmonary:  ?   Effort: Pulmonary effort is normal. No respiratory distress.  ?   Breath sounds: No wheezing.  ?Chest:  ?Breasts: ?   Right: Skin change present. No mass, nipple discharge or tenderness.  ?   Left: No mass, nipple discharge, skin change or tenderness.  ? ? ?   Comments: Right skin thickening s/p XRT ?Abdominal:  ?   General: Bowel sounds are normal.  ?   Palpations: Abdomen is  soft.  ?   Tenderness: There is no abdominal tenderness.  ?Musculoskeletal:  ?   Cervical back: Normal range of motion. No erythema.  ?   Right lower leg: No edema.  ?   Left lower leg: No edema.  ?Lymphadenopathy:  ?   Cervical: No cervical adenopathy.  ?Skin: ?   General: Skin is warm and dry.  ?   Findings: No rash.  ?Neurological:  ?   Mental Status: She is alert and oriented to person, place, and time.  ?   Cranial Nerves: No cranial nerve deficit.  ?   Sensory: No sensory deficit.  ?   Deep Tendon Reflexes: Reflexes are normal and symmetric.  ?Psychiatric:     ?   Attention and Perception: Attention normal.     ?   Mood and Affect: Mood normal.  ? ? ?Wt Readings from Last 3 Encounters:  ?08/05/21 156 lb 3.2 oz (70.9 kg)  ?08/01/20 157 lb (71.2 kg)  ?05/29/20 160 lb 15 oz (73 kg)  ? ? ?BP 124/80   Pulse 66   Ht '5\' 1"'$  (1.549 m)   Wt 156 lb 3.2 oz (70.9 kg)   LMP 12/09/2012 Comment: Hcg  SpO2 98%   BMI 29.51 kg/m?  ? ?Assessment and Plan: ?1. Annual physical exam ?Normal exam. ?Continue healthy diet, regular exercise ?She declines Shingrix and Tdap as well as Covid ?Pap is scheduled this month with GYN ?- CBC with Differential/Platelet ?- Comprehensive metabolic panel ?- Hemoglobin A1c ?- Lipid panel ?- TSH ? ?2. Need for hepatitis C screening test ?- Hepatitis C antibody ? ?3. History of breast cancer in female ?She has follow up at [facility] in the near future ? ? ?Partially dictated using Editor, commissioning. Any errors are unintentional. ? ?Halina Maidens, MD ?[facility] ?[facility] ? ?08/05/2021 ? ? ? ? ? ?

## 2021-08-06 LAB — COMPREHENSIVE METABOLIC PANEL
ALT: 17 IU/L (ref 0–32)
AST: 21 IU/L (ref 0–40)
Albumin/Globulin Ratio: 1.5 (ref 1.2–2.2)
Albumin: 4.4 g/dL (ref 3.8–4.9)
Alkaline Phosphatase: 84 IU/L (ref 44–121)
BUN/Creatinine Ratio: 19 (ref 9–23)
BUN: 15 mg/dL (ref 6–24)
Bilirubin Total: <0.2 mg/dL (ref 0.0–1.2)
CO2: 21 mmol/L (ref 20–29)
Calcium: 9.3 mg/dL (ref 8.7–10.2)
Chloride: 105 mmol/L (ref 96–106)
Creatinine, Ser: 0.79 mg/dL (ref 0.57–1.00)
Globulin, Total: 3 g/dL (ref 1.5–4.5)
Glucose: 73 mg/dL (ref 70–99)
Potassium: 3.5 mmol/L (ref 3.5–5.2)
Sodium: 141 mmol/L (ref 134–144)
Total Protein: 7.4 g/dL (ref 6.0–8.5)
eGFR: 91 mL/min/{1.73_m2} (ref >59)

## 2021-08-06 LAB — CBC WITH DIFFERENTIAL/PLATELET
Basophils Absolute: 0 10*3/uL (ref 0.0–0.2)
Basos: 1 %
EOS (ABSOLUTE): 0.1 10*3/uL (ref 0.0–0.4)
Eos: 3 %
Hematocrit: 42.6 % (ref 34.0–46.6)
Hemoglobin: 14 g/dL (ref 11.1–15.9)
Immature Grans (Abs): 0 10*3/uL (ref 0.0–0.1)
Immature Granulocytes: 0 %
Lymphocytes Absolute: 1.8 10*3/uL (ref 0.7–3.1)
Lymphs: 43 %
MCH: 28.9 pg (ref 26.6–33.0)
MCHC: 32.9 g/dL (ref 31.5–35.7)
MCV: 88 fL (ref 79–97)
Monocytes Absolute: 0.3 10*3/uL (ref 0.1–0.9)
Monocytes: 8 %
Neutrophils Absolute: 1.9 10*3/uL (ref 1.4–7.0)
Neutrophils: 45 %
Platelets: 221 10*3/uL (ref 150–450)
RBC: 4.85 x10E6/uL (ref 3.77–5.28)
RDW: 13.2 % (ref 11.7–15.4)
WBC: 4.2 10*3/uL (ref 3.4–10.8)

## 2021-08-06 LAB — HEPATITIS C ANTIBODY: Hep C Virus Ab: NONREACTIVE

## 2021-08-06 LAB — HEMOGLOBIN A1C
Est. average glucose Bld gHb Est-mCnc: 103 mg/dL
Hgb A1c MFr Bld: 5.2 % (ref 4.8–5.6)

## 2021-08-06 LAB — LIPID PANEL
Chol/HDL Ratio: 2.9 ratio (ref 0.0–4.4)
Cholesterol, Total: 224 mg/dL — ABNORMAL HIGH (ref 100–199)
HDL: 77 mg/dL (ref >39)
LDL Chol Calc (NIH): 128 mg/dL — ABNORMAL HIGH (ref 0–99)
Triglycerides: 109 mg/dL (ref 0–149)
VLDL Cholesterol Cal: 19 mg/dL (ref 5–40)

## 2021-08-06 LAB — TSH: TSH: 2.92 u[IU]/mL (ref 0.450–4.500)

## 2021-08-15 ENCOUNTER — Ambulatory Visit: Payer: Self-pay | Admitting: *Deleted

## 2021-08-15 NOTE — Telephone Encounter (Signed)
Attempted to call number listed again- no answer- same message ?No other number for patient on file. ?

## 2021-08-15 NOTE — Telephone Encounter (Signed)
Summary: advice  ? Pt stated she went to the ED today 5/11, pt stated they did several tests and pt wanted to follow up with her PCP. Pt had follow up questions are being in the ED.   ?  ?Attempted to call patient- no answer- message call can not be completed as dialed x2 ?

## 2021-08-15 NOTE — Telephone Encounter (Signed)
Third attempt to reach patient at number listed- no answer same message- call can not be completed at this time ?

## 2021-08-19 LAB — HM PAP SMEAR: HM Pap smear: NEGATIVE

## 2021-08-19 LAB — RESULTS CONSOLE HPV: CHL HPV: NEGATIVE

## 2021-09-03 DIAGNOSIS — R87618 Other abnormal cytological findings on specimens from cervix uteri: Secondary | ICD-10-CM | POA: Insufficient documentation

## 2021-12-06 LAB — HM MAMMOGRAPHY

## 2022-01-20 DIAGNOSIS — E28319 Asymptomatic premature menopause: Secondary | ICD-10-CM | POA: Insufficient documentation

## 2022-08-06 ENCOUNTER — Encounter: Payer: Self-pay | Admitting: Internal Medicine

## 2022-08-07 ENCOUNTER — Encounter: Payer: Self-pay | Admitting: Internal Medicine

## 2022-08-07 ENCOUNTER — Ambulatory Visit (INDEPENDENT_AMBULATORY_CARE_PROVIDER_SITE_OTHER): Payer: Managed Care, Other (non HMO) | Admitting: Internal Medicine

## 2022-08-07 VITALS — BP 120/78 | HR 65 | Ht 61.0 in | Wt 151.6 lb

## 2022-08-07 DIAGNOSIS — Z0000 Encounter for general adult medical examination without abnormal findings: Secondary | ICD-10-CM

## 2022-08-07 DIAGNOSIS — Z853 Personal history of malignant neoplasm of breast: Secondary | ICD-10-CM | POA: Diagnosis not present

## 2022-08-07 DIAGNOSIS — E785 Hyperlipidemia, unspecified: Secondary | ICD-10-CM | POA: Diagnosis not present

## 2022-08-07 DIAGNOSIS — Z131 Encounter for screening for diabetes mellitus: Secondary | ICD-10-CM | POA: Diagnosis not present

## 2022-08-07 NOTE — Patient Instructions (Signed)
-  It was a pleasure to see you today! Please review your visit summary for helpful information. -Lab results are usually available within 1-2 days and we will call once reviewed. -I would encourage you to follow your care via MyChart where you can access lab results, notes, messages, and more. -If you feel that we did a nice job today, please complete your after-visit survey and leave us a Google review! Your CMA today was Momina Hunton and your provider was Dr Laura Berglund, MD.  

## 2022-08-07 NOTE — Progress Notes (Signed)
Date:  08/07/2022   Name:  Natalie Gallagher   DOB:  01-02-1971   MRN:  [id number]   Chief Complaint: Annual Exam Natalie Gallagher is a 52 y.o. female who presents today for her Complete Annual Exam. She feels well. She reports exercising walking and going to the gym. She reports she is sleeping well. Breast complaints - none.  Mammogram: 12/2021 DEXA: none Pap smear: 08/2021 neg/pos @ [facility] - due for repeat Colonoscopy: 09/2019 repeat 10 yr  Health Maintenance Due  Topic Date Due   DTaP/Tdap/Td (1 - Tdap) Never done   Zoster Vaccines- Shingrix (1 of 2) Never done     There is no immunization history on file for this patient.  HPI  Lab Results  Component Value Date   NA 141 08/05/2021   K 3.5 08/05/2021   CO2 21 08/05/2021   GLUCOSE 73 08/05/2021   BUN 15 08/05/2021   CREATININE 0.79 08/05/2021   CALCIUM 9.3 08/05/2021   EGFR 91 08/05/2021   GFRNONAA >60 05/29/2020   Lab Results  Component Value Date   CHOL 224 (H) 08/05/2021   HDL 77 08/05/2021   LDLCALC 128 (H) 08/05/2021   TRIG 109 08/05/2021   CHOLHDL 2.9 08/05/2021   Lab Results  Component Value Date   TSH 2.920 08/05/2021   Lab Results  Component Value Date   HGBA1C 5.2 08/05/2021   Lab Results  Component Value Date   WBC 4.2 08/05/2021   HGB 14.0 08/05/2021   HCT 42.6 08/05/2021   MCV 88 08/05/2021   PLT 221 08/05/2021   Lab Results  Component Value Date   ALT 17 08/05/2021   AST 21 08/05/2021   ALKPHOS 84 08/05/2021   BILITOT <0.2 08/05/2021   No results found for: "25OHVITD2", "25OHVITD3", "VD25OH"   Review of Systems  Constitutional:  Negative for chills, fatigue and fever.  HENT:  Negative for congestion, hearing loss, tinnitus, trouble swallowing and voice change.   Eyes:  Negative for visual disturbance.  Respiratory:  Negative for cough, chest tightness, shortness of breath and wheezing.   Cardiovascular:  Negative for chest pain, palpitations and leg swelling.  Gastrointestinal:  Negative  for abdominal pain, constipation, diarrhea and vomiting.  Endocrine: Negative for polydipsia and polyuria.  Genitourinary:  Negative for dysuria, frequency, genital sores, vaginal bleeding and vaginal discharge.  Musculoskeletal:  Negative for arthralgias, gait problem and joint swelling.  Skin:  Negative for color change and rash.  Neurological:  Negative for dizziness, tremors, light-headedness and headaches.  Hematological:  Negative for adenopathy. Does not bruise/bleed easily.  Psychiatric/Behavioral:  Negative for dysphoric mood and sleep disturbance. The patient is not nervous/anxious.     Patient Active Problem List   Diagnosis Date Noted   Premature menopause 01/20/2022   Pap smear abnormality of cervix/human papillomavirus (HPV) positive 09/03/2021   Lymphocytic colitis 07/31/2020   Abnormal MRI, breast 03/29/2019   Intramural leiomyoma of uterus 02/12/2019   Lymphedema of right upper extremity 09/29/2017   History of breast cancer in female 07/20/2013   BMI 29.0-29.9,adult 11/28/2011    Allergies  Allergen Reactions   Tape     The patient showed a cutaneous reaction to Telfa applied beneath a Tegaderm dressing. Telfa appears to be the primary offensive material.    Past Surgical History:  Procedure Laterality Date   BREAST BIOPSY Right 2014   Positive   BREAST BIOPSY Left 03/2019   MRI Biopsy, benign, cylindrical tissue marker clip    BREAST  BIOPSY Left 03/2019   MRI Biopsy, benign, Dumb-bell clip   BREAST LUMPECTOMY Right 2014   BREAST SURGERY Right 06/21/2012   right wide excision with sentinal node biopsy/axilary dissection.   COLONOSCOPY WITH PROPOFOL N/A 10/01/2016   Procedure: COLONOSCOPY WITH PROPOFOL;  Surgeon: [name], MD;  Location: [facility] ENDOSCOPY;  Service: Endoscopy;  Laterality: N/A;   COLONOSCOPY WITH PROPOFOL N/A 09/07/2019   Procedure: COLONOSCOPY WITH PROPOFOL;  Surgeon: Pasty Spillers, MD;  Location: [facility] ENDOSCOPY;  Service:  Endoscopy;  Laterality: N/A;   STRABISMUS SURGERY      Social History   Tobacco Use   Smoking status: Never   Smokeless tobacco: Never  Vaping Use   Vaping Use: Never used  Substance Use Topics   Alcohol use: Yes    Comment: rare   Drug use: No     Medication list has been reviewed and updated.  Current Meds  Medication Sig   Multiple Vitamins-Calcium (VIACTIV MULTI-VITAMIN PO) Take 2 tablets by mouth daily.       08/07/2022    8:17 AM 08/05/2021    8:40 AM 08/01/2020    8:43 AM  GAD 7 : Generalized Anxiety Score  Nervous, Anxious, on Edge 0 0 0  Control/stop worrying 0 0 0  Worry too much - different things 0 0 0  Trouble relaxing 0 0 0  Restless 0 0 0  Easily annoyed or irritable 0 0 0  Afraid - awful might happen 0 0 0  Total GAD 7 Score 0 0 0  Anxiety Difficulty Not difficult at all Not difficult at all        08/07/2022    8:17 AM 08/05/2021    8:40 AM 08/01/2020    8:43 AM  Depression screen PHQ 2/9  Decreased Interest 0 0 0  Down, Depressed, Hopeless 0 0 0  PHQ - 2 Score 0 0 0  Altered sleeping 0 0 0  Tired, decreased energy 0 0 0  Change in appetite 0 0 0  Feeling bad or failure about yourself  0 0 0  Trouble concentrating 0 0 0  Moving slowly or fidgety/restless 0 0 0  Suicidal thoughts 0 0 0  PHQ-9 Score 0 0 0  Difficult doing work/chores Not difficult at all Not difficult at all     BP Readings from Last 3 Encounters:  08/07/22 120/78  08/05/21 124/80  08/01/20 118/84    Physical Exam Vitals and nursing note reviewed.  Constitutional:      General: She is not in acute distress.    Appearance: She is well-developed.  HENT:     Head: Normocephalic and atraumatic.     Right Ear: Tympanic membrane and ear canal normal.     Left Ear: Tympanic membrane and ear canal normal.     Nose:     Right Sinus: No maxillary sinus tenderness.     Left Sinus: No maxillary sinus tenderness.  Eyes:     General: No scleral icterus.       Right eye: No  discharge.        Left eye: No discharge.     Conjunctiva/sclera: Conjunctivae normal.  Neck:     Thyroid: No thyromegaly.     Vascular: No carotid bruit.  Cardiovascular:     Rate and Rhythm: Normal rate and regular rhythm.     Pulses: Normal pulses.     Heart sounds: Normal heart sounds.  Pulmonary:     Effort: Pulmonary effort  is normal. No respiratory distress.     Breath sounds: No wheezing.  Chest:  Breasts:    Right: Skin change present. No mass, nipple discharge or tenderness.     Left: No mass, nipple discharge, skin change or tenderness.       Comments: Lumpectomy scar on right with mild skin thickening Abdominal:     General: Bowel sounds are normal.     Palpations: Abdomen is soft.     Tenderness: There is no abdominal tenderness.  Musculoskeletal:     Cervical back: Normal range of motion. No erythema.     Right lower leg: No edema.     Left lower leg: No edema.  Lymphadenopathy:     Cervical: No cervical adenopathy.  Skin:    General: Skin is warm and dry.     Findings: No rash.  Neurological:     Mental Status: She is alert and oriented to person, place, and time.     Cranial Nerves: No cranial nerve deficit.     Sensory: No sensory deficit.     Deep Tendon Reflexes: Reflexes are normal and symmetric.  Psychiatric:        Attention and Perception: Attention normal.        Mood and Affect: Mood normal.     Wt Readings from Last 3 Encounters:  08/07/22 151 lb 9.6 oz (68.8 kg)  08/05/21 156 lb 3.2 oz (70.9 kg)  08/01/20 157 lb (71.2 kg)    BP 120/78   Pulse 65   Ht 5\' 1"  (1.549 m)   Wt 151 lb 9.6 oz (68.8 kg)   LMP 12/09/2012 Comment: Hcg  SpO2 100%   BMI 28.64 kg/m   Assessment and Plan:  Problem List Items Addressed This Visit       Other   History of breast cancer in female    She remains cancer free; followed by [facility] Last mammogram 12/2021      Relevant Orders   Cancer antigen 27.29   Other Visit Diagnoses     Annual  physical exam    -  Primary   continue healthy diet and exercise consider Shingrix vaccines she declines other immunizations   Relevant Orders   CBC with Differential/Platelet   Comprehensive metabolic panel   Hemoglobin A1c   Lipid panel   TSH   Mild hyperlipidemia       Relevant Orders   Lipid panel   Screening for diabetes mellitus       Relevant Orders   Hemoglobin A1c       Return in about 1 year (around 08/07/2023) for CPX.   Partially dictated using Dragon software, any errors are not intentional.  Reubin Milan, MD [facility] [place], [place]

## 2022-08-07 NOTE — Assessment & Plan Note (Addendum)
She remains cancer free; followed by [facility] Last mammogram 12/2021

## 2022-08-08 LAB — LIPID PANEL
Chol/HDL Ratio: 3 ratio (ref 0.0–4.4)
Cholesterol, Total: 205 mg/dL — ABNORMAL HIGH (ref 100–199)
HDL: 68 mg/dL (ref >39)
LDL Chol Calc (NIH): 125 mg/dL — ABNORMAL HIGH (ref 0–99)
Triglycerides: 65 mg/dL (ref 0–149)
VLDL Cholesterol Cal: 12 mg/dL (ref 5–40)

## 2022-08-08 LAB — TSH: TSH: 1.82 u[IU]/mL (ref 0.450–4.500)

## 2022-08-08 LAB — CBC WITH DIFFERENTIAL/PLATELET
Basophils Absolute: 0 10*3/uL (ref 0.0–0.2)
Basos: 0 %
EOS (ABSOLUTE): 0.1 10*3/uL (ref 0.0–0.4)
Eos: 3 %
Hematocrit: 38 % (ref 34.0–46.6)
Hemoglobin: 12.7 g/dL (ref 11.1–15.9)
Immature Grans (Abs): 0 10*3/uL (ref 0.0–0.1)
Immature Granulocytes: 0 %
Lymphocytes Absolute: 1.5 10*3/uL (ref 0.7–3.1)
Lymphs: 41 %
MCH: 28.8 pg (ref 26.6–33.0)
MCHC: 33.4 g/dL (ref 31.5–35.7)
MCV: 86 fL (ref 79–97)
Monocytes Absolute: 0.3 10*3/uL (ref 0.1–0.9)
Monocytes: 9 %
Neutrophils Absolute: 1.7 10*3/uL (ref 1.4–7.0)
Neutrophils: 47 %
Platelets: 195 10*3/uL (ref 150–450)
RBC: 4.41 x10E6/uL (ref 3.77–5.28)
RDW: 13.1 % (ref 11.7–15.4)
WBC: 3.7 10*3/uL (ref 3.4–10.8)

## 2022-08-08 LAB — HEMOGLOBIN A1C
Est. average glucose Bld gHb Est-mCnc: 103 mg/dL
Hgb A1c MFr Bld: 5.2 % (ref 4.8–5.6)

## 2022-08-08 LAB — COMPREHENSIVE METABOLIC PANEL
ALT: 20 IU/L (ref 0–32)
AST: 19 IU/L (ref 0–40)
Albumin/Globulin Ratio: 1.3 (ref 1.2–2.2)
Albumin: 3.9 g/dL (ref 3.8–4.9)
Alkaline Phosphatase: 78 IU/L (ref 44–121)
BUN/Creatinine Ratio: 15 (ref 9–23)
BUN: 13 mg/dL (ref 6–24)
Bilirubin Total: 0.3 mg/dL (ref 0.0–1.2)
CO2: 21 mmol/L (ref 20–29)
Calcium: 9.2 mg/dL (ref 8.7–10.2)
Chloride: 102 mmol/L (ref 96–106)
Creatinine, Ser: 0.86 mg/dL (ref 0.57–1.00)
Globulin, Total: 3 g/dL (ref 1.5–4.5)
Glucose: 56 mg/dL — ABNORMAL LOW (ref 70–99)
Potassium: 4 mmol/L (ref 3.5–5.2)
Sodium: 140 mmol/L (ref 134–144)
Total Protein: 6.9 g/dL (ref 6.0–8.5)
eGFR: 81 mL/min/{1.73_m2} (ref >59)

## 2022-08-08 LAB — CANCER ANTIGEN 27.29: CA 27.29: 15.8 U/mL (ref 0.0–38.6)

## 2023-05-21 ENCOUNTER — Telehealth: Payer: Self-pay | Admitting: Internal Medicine

## 2023-05-21 NOTE — Telephone Encounter (Signed)
Viewed chart. Im not sure who called the patient.  KP

## 2023-05-21 NOTE — Telephone Encounter (Signed)
Copied from CRM [id number]. Topic: General - Call Back - No Documentation >> May 21, 2023 11:42 AM Macon Large wrote: Reason for CRM: Pt stated that she had a missed call from the office so she was just returning the call.

## 2023-08-11 ENCOUNTER — Encounter: Payer: Self-pay | Admitting: Internal Medicine
